# Patient Record
Sex: Female | Born: 1950
Health system: Southern US, Community
[De-identification: ages and names within clinical notes are randomized; demographics above are authoritative.]

## PROBLEM LIST (undated history)

## (undated) DIAGNOSIS — I10 Essential (primary) hypertension: Secondary | ICD-10-CM

## (undated) DIAGNOSIS — M1990 Unspecified osteoarthritis, unspecified site: Secondary | ICD-10-CM

## (undated) DIAGNOSIS — R7303 Prediabetes: Secondary | ICD-10-CM

## (undated) DIAGNOSIS — J189 Pneumonia, unspecified organism: Secondary | ICD-10-CM

## (undated) DIAGNOSIS — E039 Hypothyroidism, unspecified: Secondary | ICD-10-CM

## (undated) HISTORY — PX: TONSILLECTOMY: SUR1361

## (undated) HISTORY — PX: MENISCUS REPAIR: SHX5179

## (undated) HISTORY — PX: MULTIPLE TOOTH EXTRACTIONS: SHX2053

## (undated) HISTORY — PX: OTHER SURGICAL HISTORY: SHX169

## (undated) HISTORY — PX: JOINT REPLACEMENT: SHX530

---

## 2013-05-16 ENCOUNTER — Other Ambulatory Visit: Payer: Self-pay | Admitting: Family Medicine

## 2013-05-16 ENCOUNTER — Other Ambulatory Visit (HOSPITAL_COMMUNITY)
Admission: RE | Admit: 2013-05-16 | Discharge: 2013-05-16 | Disposition: A | Discharge status: Home / Self Care | Payer: 59 | Source: Ambulatory Visit | Attending: Family Medicine | Admitting: Family Medicine

## 2013-05-16 DIAGNOSIS — Z124 Encounter for screening for malignant neoplasm of cervix: Secondary | ICD-10-CM | POA: Insufficient documentation

## 2013-05-16 DIAGNOSIS — Z1151 Encounter for screening for human papillomavirus (HPV): Secondary | ICD-10-CM | POA: Insufficient documentation

## 2013-05-16 DIAGNOSIS — M8580 Other specified disorders of bone density and structure, unspecified site: Secondary | ICD-10-CM

## 2013-05-16 DIAGNOSIS — Z1231 Encounter for screening mammogram for malignant neoplasm of breast: Secondary | ICD-10-CM

## 2013-06-01 ENCOUNTER — Ambulatory Visit
Admission: RE | Admit: 2013-06-01 | Discharge: 2013-06-01 | Disposition: A | Discharge status: Home / Self Care | Payer: 59 | Source: Ambulatory Visit | Attending: Family Medicine | Admitting: Family Medicine

## 2013-06-01 DIAGNOSIS — M8580 Other specified disorders of bone density and structure, unspecified site: Secondary | ICD-10-CM

## 2013-06-01 DIAGNOSIS — Z1231 Encounter for screening mammogram for malignant neoplasm of breast: Secondary | ICD-10-CM

## 2014-08-03 ENCOUNTER — Other Ambulatory Visit: Payer: Self-pay

## 2014-08-03 DIAGNOSIS — Z1231 Encounter for screening mammogram for malignant neoplasm of breast: Secondary | ICD-10-CM

## 2014-08-13 ENCOUNTER — Ambulatory Visit
Admission: RE | Admit: 2014-08-13 | Discharge: 2014-08-13 | Disposition: A | Discharge status: Home / Self Care | Payer: 59 | Source: Ambulatory Visit

## 2014-08-13 ENCOUNTER — Encounter (INDEPENDENT_AMBULATORY_CARE_PROVIDER_SITE_OTHER): Payer: Self-pay

## 2014-08-13 DIAGNOSIS — Z1231 Encounter for screening mammogram for malignant neoplasm of breast: Secondary | ICD-10-CM

## 2015-09-30 ENCOUNTER — Other Ambulatory Visit: Payer: Self-pay

## 2015-09-30 DIAGNOSIS — Z1231 Encounter for screening mammogram for malignant neoplasm of breast: Secondary | ICD-10-CM

## 2015-11-08 ENCOUNTER — Ambulatory Visit: Payer: Self-pay

## 2015-11-27 ENCOUNTER — Ambulatory Visit: Payer: Self-pay

## 2015-11-27 ENCOUNTER — Ambulatory Visit
Admission: RE | Admit: 2015-11-27 | Discharge: 2015-11-27 | Disposition: A | Discharge status: Home / Self Care | Payer: BLUE CROSS/BLUE SHIELD | Source: Ambulatory Visit

## 2015-11-27 DIAGNOSIS — Z1231 Encounter for screening mammogram for malignant neoplasm of breast: Secondary | ICD-10-CM

## 2016-03-13 ENCOUNTER — Other Ambulatory Visit: Payer: Self-pay | Admitting: Physician Assistant

## 2016-03-13 DIAGNOSIS — R1032 Left lower quadrant pain: Secondary | ICD-10-CM

## 2016-03-17 ENCOUNTER — Ambulatory Visit
Admission: RE | Admit: 2016-03-17 | Discharge: 2016-03-17 | Disposition: A | Discharge status: Home / Self Care | Payer: BLUE CROSS/BLUE SHIELD | Source: Ambulatory Visit | Attending: Physician Assistant | Admitting: Physician Assistant

## 2016-03-17 ENCOUNTER — Other Ambulatory Visit: Payer: Self-pay | Admitting: Physician Assistant

## 2016-03-17 DIAGNOSIS — M25552 Pain in left hip: Secondary | ICD-10-CM

## 2016-03-20 ENCOUNTER — Other Ambulatory Visit: Payer: BLUE CROSS/BLUE SHIELD

## 2016-06-08 ENCOUNTER — Other Ambulatory Visit: Payer: Self-pay | Admitting: Physician Assistant

## 2016-06-08 ENCOUNTER — Other Ambulatory Visit (HOSPITAL_COMMUNITY)
Admission: RE | Admit: 2016-06-08 | Discharge: 2016-06-08 | Disposition: A | Discharge status: Home / Self Care | Payer: BLUE CROSS/BLUE SHIELD | Source: Ambulatory Visit | Attending: Physician Assistant | Admitting: Physician Assistant

## 2016-06-08 DIAGNOSIS — M8580 Other specified disorders of bone density and structure, unspecified site: Secondary | ICD-10-CM

## 2016-06-08 DIAGNOSIS — Z124 Encounter for screening for malignant neoplasm of cervix: Secondary | ICD-10-CM | POA: Diagnosis present

## 2016-06-08 DIAGNOSIS — Z1151 Encounter for screening for human papillomavirus (HPV): Secondary | ICD-10-CM | POA: Insufficient documentation

## 2016-06-09 LAB — CYTOLOGY - PAP

## 2016-06-24 ENCOUNTER — Ambulatory Visit
Admission: RE | Admit: 2016-06-24 | Discharge: 2016-06-24 | Disposition: A | Discharge status: Home / Self Care | Payer: BLUE CROSS/BLUE SHIELD | Source: Ambulatory Visit | Attending: Physician Assistant | Admitting: Physician Assistant

## 2016-06-24 DIAGNOSIS — M8580 Other specified disorders of bone density and structure, unspecified site: Secondary | ICD-10-CM

## 2016-12-02 ENCOUNTER — Other Ambulatory Visit: Payer: Self-pay | Admitting: Physician Assistant

## 2016-12-02 DIAGNOSIS — Z1231 Encounter for screening mammogram for malignant neoplasm of breast: Secondary | ICD-10-CM

## 2016-12-25 ENCOUNTER — Ambulatory Visit
Admission: RE | Admit: 2016-12-25 | Discharge: 2016-12-25 | Disposition: A | Discharge status: Home / Self Care | Payer: BLUE CROSS/BLUE SHIELD | Source: Ambulatory Visit | Attending: Physician Assistant | Admitting: Physician Assistant

## 2016-12-25 DIAGNOSIS — Z1231 Encounter for screening mammogram for malignant neoplasm of breast: Secondary | ICD-10-CM

## 2017-03-30 ENCOUNTER — Ambulatory Visit (INDEPENDENT_AMBULATORY_CARE_PROVIDER_SITE_OTHER): Payer: Self-pay

## 2017-03-30 ENCOUNTER — Ambulatory Visit (INDEPENDENT_AMBULATORY_CARE_PROVIDER_SITE_OTHER): Payer: BLUE CROSS/BLUE SHIELD

## 2017-03-30 ENCOUNTER — Ambulatory Visit (INDEPENDENT_AMBULATORY_CARE_PROVIDER_SITE_OTHER): Payer: BLUE CROSS/BLUE SHIELD | Admitting: Physician Assistant

## 2017-03-30 DIAGNOSIS — M25571 Pain in right ankle and joints of right foot: Secondary | ICD-10-CM | POA: Diagnosis not present

## 2017-03-30 DIAGNOSIS — M1711 Unilateral primary osteoarthritis, right knee: Secondary | ICD-10-CM

## 2017-03-30 DIAGNOSIS — M1712 Unilateral primary osteoarthritis, left knee: Secondary | ICD-10-CM

## 2017-03-30 MED ORDER — LIDOCAINE HCL 1 % IJ SOLN
3.0000 mL | INTRAMUSCULAR | Status: AC | PRN
  Administered 2017-03-30: 3 mL

## 2017-03-30 MED ORDER — METHYLPREDNISOLONE ACETATE 40 MG/ML IJ SUSP
40.0000 mg | INTRAMUSCULAR | Status: AC | PRN
  Administered 2017-03-30: 40 mg via INTRA_ARTICULAR

## 2017-03-30 NOTE — Progress Notes (Signed)
Office Visit Note   Patient: Melissa [name]           Date of Birth: 1951-10-18           MRN: [id number] Visit Date: 03/30/2017              Requested by: Michel Harrow, PA-C [place], [place] [address] PCP: Michel Harrow, PA-C   Assessment & Plan: Visit Diagnoses:  1. Primary osteoarthritis of left knee   2. Primary osteoarthritis of right knee   3. Pain in right ankle and joints of right foot     Plan: We will obtain a CT scan of her right foot to evaluate fifth metatarsal fracture for nonunion versus acute fracture. Follow up after the CT scan to go over results and discuss further treatment. Regards to her knees I discussed with her quad strengthening and over-the-counter NSAIDs. Also discussed with her tumeric. She did like to continue with conservative treatment for her knees which will include occasional cortisone injections in the knees.  Follow-Up Instructions: Return in about 2 weeks (around 04/13/2017) for After CT scan.   Orders:  Orders Placed This Encounter  Procedures  . Large Joint Injection/Arthrocentesis  . Large Joint Injection/Arthrocentesis  . XR Knee 1-2 Views Left  . XR Knee 1-2 Views Right  . XR Foot Complete Right   No orders of the defined types were placed in this encounter.     Procedures: Large Joint Inj Date/Time: 03/30/2017 10:47 AM Performed by: Pete Pelt Authorized by: Pete Pelt   Consent Given by:  Patient Indications:  Pain Location:  Knee Site:  L knee Needle Size:  25 G Approach:  Anterolateral Ultrasound Guidance: No   Fluoroscopic Guidance: No   Medications:  40 mg methylPREDNISolone acetate 40 MG/ML; 3 mL lidocaine 1 % Aspiration Attempted: No   Patient tolerance:  Patient tolerated the procedure well with no immediate complications Large Joint Inj Date/Time: 03/30/2017 10:48 AM Performed by: Pete Pelt Authorized by: Pete Pelt   Consent Given by:  Patient Indications:   Pain Location:  Knee Site:  R knee Needle Size:  25 G Approach:  Anterolateral Ultrasound Guidance: No   Fluoroscopic Guidance: No   Medications:  40 mg methylPREDNISolone acetate 40 MG/ML; 3 mL lidocaine 1 % Aspiration Attempted: No   Patient tolerance:  Patient tolerated the procedure well with no immediate complications     Clinical Data: No additional findings.   Subjective: Bilateral knee pain Right foot pain  HPI Melissa [name] 66 year old female with bilateral knee pain she states 3 years. She didn't go into the orthopedist here in town and states that she's had cortisone injections that she doesn't want any more steroid injections. She is prediabetic. She also reports that she's had supplemental injections in the left knee in the right knee was not approved for supplemental injection in that this gave her no relief. She does not want any surgery on either knee. She is seeking a second opinion on what else she can do to deal with the pain. She has lost weight and is working with a Physiological scientist to increase her quad strengths. Right foot history of fracture some 10 years ago. She points to the lateral aspect of the fifth metatarsal. She still notes that she's been told she has an arthritic flare her foot and was seen by a podiatrist was given a cortisone injection 6 weeks ago. She feels the fact that the  cortisone injection made her pain worse. She's having swelling at the end of the day and pain in the foot. She's had no new injury to the foot that she is aware of.  Review of Systems Denies chest pain shortness breath fevers chills. Otherwise please see history of present illness.  Objective: Vital Signs: There were no vitals taken for this visit.  Physical Exam  Constitutional: She is oriented to person, place, and time. She appears well-developed and well-nourished. No distress.  Pulmonary/Chest: Effort normal.  Neurological: She is alert and oriented to person, place,  and time.  Psychiatric: She has a normal mood and affect. Her behavior is normal.    Ortho Exam Bilateral knees she has good range of motion both knees. Crepitus with passive range of motion of both knees. Tenderness along the medial joint line of the old left knee. No instability valgus varus stressing of either knee no effusion abnormal warmth or erythema of either knee. Right foot she's tender over the proximal portion of the fifth metatarsal shaft. She has 5 out of 5 strength with inversion eversion against resistance in eversion however causes some pain fifth metatarsal area. Remainder the foot is nontender. No rashes skin lesions ulcerations erythema ecchymosis or edema. Specialty Comments:  No specialty comments available.  Imaging: No results found.   PMFS History: There are no active problems to display for this patient.  No past medical history on file.  No family history on file.  No past surgical history on file. Social History   Occupational History  . Not on file.   Social History Main Topics  . Smoking status: Not on file  . Smokeless tobacco: Not on file  . Alcohol use Not on file  . Drug use: Unknown  . Sexual activity: Not on file

## 2017-04-01 ENCOUNTER — Other Ambulatory Visit (INDEPENDENT_AMBULATORY_CARE_PROVIDER_SITE_OTHER): Payer: Self-pay

## 2017-04-01 DIAGNOSIS — M25571 Pain in right ankle and joints of right foot: Secondary | ICD-10-CM

## 2017-04-05 ENCOUNTER — Ambulatory Visit
Admission: RE | Admit: 2017-04-05 | Discharge: 2017-04-05 | Disposition: A | Discharge status: Home / Self Care | Payer: BLUE CROSS/BLUE SHIELD | Source: Ambulatory Visit | Attending: Physician Assistant | Admitting: Physician Assistant

## 2017-04-05 DIAGNOSIS — M25571 Pain in right ankle and joints of right foot: Secondary | ICD-10-CM

## 2017-04-14 ENCOUNTER — Ambulatory Visit (INDEPENDENT_AMBULATORY_CARE_PROVIDER_SITE_OTHER): Payer: BLUE CROSS/BLUE SHIELD | Admitting: Orthopaedic Surgery

## 2017-04-14 DIAGNOSIS — S92351K Displaced fracture of fifth metatarsal bone, right foot, subsequent encounter for fracture with nonunion: Secondary | ICD-10-CM

## 2017-04-14 DIAGNOSIS — M79671 Pain in right foot: Secondary | ICD-10-CM

## 2017-04-14 NOTE — Progress Notes (Signed)
She return for follow-up to discuss a CT scan of her right foot. We had diagnosed a fifth metatarsal fracture which appears to be potentially a nonunion. She was that x-rays in the past that she is not seen her self apparently did not show any fractures. She saw a podiatrist who injected the area of her foot and she is in the next day was severely painful. She said she can go to that office next week get her previous x-rays for my review. In our office with x-rays that were concerning for a fracture nonunion of the fifth metatarsal right foot. She walks with regular shoes and she has mild to moderate pain. He provided on examination to deep palpation she does have pain along the fifth metatarsal right foot. The CT scan does show what appears to be a nonunion of a fifth metatarsal fracture which is at the metaphyseal diaphyseal area more consistent with a Jones fracture.  At this point I would like see her back in 4 weeks ago she feels that it be worthwhile to get the x-rays were podiatrist which I agree with. I like to get a repeat 3 views of her right foot with plain x-rays in 3 weeks as well. She'll continue to avoid high-impact aerobic activities as well.

## 2017-05-12 ENCOUNTER — Ambulatory Visit (INDEPENDENT_AMBULATORY_CARE_PROVIDER_SITE_OTHER): Payer: BLUE CROSS/BLUE SHIELD

## 2017-05-12 ENCOUNTER — Ambulatory Visit (INDEPENDENT_AMBULATORY_CARE_PROVIDER_SITE_OTHER): Payer: BLUE CROSS/BLUE SHIELD | Admitting: Physician Assistant

## 2017-05-12 DIAGNOSIS — M79671 Pain in right foot: Secondary | ICD-10-CM | POA: Diagnosis not present

## 2017-05-12 DIAGNOSIS — M170 Bilateral primary osteoarthritis of knee: Secondary | ICD-10-CM

## 2017-05-12 DIAGNOSIS — S92351K Displaced fracture of fifth metatarsal bone, right foot, subsequent encounter for fracture with nonunion: Secondary | ICD-10-CM

## 2017-05-12 NOTE — Progress Notes (Signed)
Office Visit Note   Patient: Melissa [name]           Date of Birth: [birth year]           MRN: [id number] Visit Date: 05/12/2017              Requested by: Michel Harrow, PA-C [place], [place] [address] PCP: Michel Harrow, PA-C   Assessment & Plan: Visit Diagnoses:  1. Pain in right foot   2. 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 Primary osteoarthritis of both knees     Plan: Place her in a short Cam Walker boot on the right. She is not to drive in it. She does not need to sleep in the boot. We will try to obtain a bone stimulator for her right foot fifth metatarsal fracture. We'll obtain AP lateral and oblique views of the right foot at return Regards to her knee she understands that we cannot inject the knees again until August. We'll see her back in that time inject both knees with the cortisone.  Follow-Up Instructions: Return in about 7 weeks (around 06/30/2017).   Orders:  Orders Placed This Encounter  Procedures  . XR Foot Complete Right   No orders of the defined types were placed in this encounter.     Procedures: No procedures performed   Clinical Data: No additional findings.   Subjective: Chief Complaint  Patient presents with  . Right Foot - Follow-up    Nonunion 5th MT fx  Bilateral knee pain  HPI  Melissa [name] Is a 66 year old female who returns today follow-up for right foot fifth metatarsal fracture she brings with her films on a disc from her podiatrist which show 3 views of the right foot on [age] teen. These showed no acute fracture. She states she went home later that day and twisted the foot and had pain in the foot return to the podiatrist the next day as for radiographs and he stated that the pain in her foot was due to the cortisone injection given her the day before. Since then she is found to have a fifth metatarsal fracture on plain radiograph and underwent a CT a CT scan of the  right foot that was ordered by her office. This showed what appeared to be a nonunion fracture of the fifth metatarsal. She states she has some pain in the foot and swelling by the end of the day that her knees hurt just as bad.  Review of Systems See HPI otherwise negative  Objective: Vital Signs: There were no vitals taken for this visit.  Physical Exam  Constitutional: She is oriented to person, place, and time. She appears well-developed and well-nourished. No distress.  Pulmonary/Chest: Effort normal.  Neurological: She is alert and oriented to person, place, and time.  Psychiatric: She has a normal mood and affect. Her behavior is normal.    Ortho Exam Right foot tenderness over the base of the fifth metatarsal. No rashes skin lesions ulcerations erythema or impending ulcers or edema. She has good inversion eversion foot.   Specialty Comments:  No specialty comments available.  Imaging: Xr Foot Complete Right  Result Date: 05/12/2017 Right foot: 5th metatarsal : Fifth metatarsal fracture remains basically unchanged do not see any significant callus formation since previous films on 03/30/2017.    PMFS History: Patient Active Problem List   Diagnosis Date Noted  . Closed fracture of base of fifth  metatarsal bone with nonunion, right 04/14/2017   No past medical history on file.  No family history on file.  No past surgical history on file. Social History   Occupational History  . Not on file.   Social History Main Topics  . Smoking status: Not on file  . Smokeless tobacco: Not on file  . Alcohol use Not on file  . Drug use: Unknown  . Sexual activity: Not on file

## 2017-07-05 ENCOUNTER — Ambulatory Visit (INDEPENDENT_AMBULATORY_CARE_PROVIDER_SITE_OTHER): Payer: Self-pay

## 2017-07-05 ENCOUNTER — Ambulatory Visit (INDEPENDENT_AMBULATORY_CARE_PROVIDER_SITE_OTHER): Payer: BLUE CROSS/BLUE SHIELD | Admitting: Orthopaedic Surgery

## 2017-07-05 ENCOUNTER — Encounter (INDEPENDENT_AMBULATORY_CARE_PROVIDER_SITE_OTHER): Payer: Self-pay | Admitting: Orthopaedic Surgery

## 2017-07-05 DIAGNOSIS — G8929 Other chronic pain: Secondary | ICD-10-CM | POA: Diagnosis not present

## 2017-07-05 DIAGNOSIS — M1711 Unilateral primary osteoarthritis, right knee: Secondary | ICD-10-CM | POA: Diagnosis not present

## 2017-07-05 DIAGNOSIS — M79671 Pain in right foot: Secondary | ICD-10-CM | POA: Diagnosis not present

## 2017-07-05 DIAGNOSIS — M25562 Pain in left knee: Secondary | ICD-10-CM

## 2017-07-05 DIAGNOSIS — M25561 Pain in right knee: Secondary | ICD-10-CM

## 2017-07-05 DIAGNOSIS — M1712 Unilateral primary osteoarthritis, left knee: Secondary | ICD-10-CM

## 2017-07-05 MED ORDER — LIDOCAINE HCL 1 % IJ SOLN
3.0000 mL | INTRAMUSCULAR | Status: AC | PRN
  Administered 2017-07-05: 3 mL

## 2017-07-05 MED ORDER — METHYLPREDNISOLONE ACETATE 40 MG/ML IJ SUSP
40.0000 mg | INTRAMUSCULAR | Status: AC | PRN
  Administered 2017-07-05: 40 mg via INTRA_ARTICULAR

## 2017-07-05 NOTE — Progress Notes (Signed)
Office Visit Note   Patient: Melissa [name]           Date of Birth: [birth year]           MRN: [id number] Visit Date: 07/05/2017              Requested by: Michel Harrow, PA-C [place], [place] [address] PCP: Michel Harrow, PA-C   Assessment & Plan: Visit Diagnoses:  1. Pain in right foot   2. Chronic pain of right knee   3. Chronic pain of left knee   4. Unilateral primary osteoarthritis, left knee   5. Unilateral primary osteoarthritis, right knee     Plan: Given the fact she is having no pain over her fifth metatarsal area on exam is only in her ankle with a very stiff ankle joint she rather not have any type of surgery right now. She is a low physical demands type of person. We'll see her back in 3 months with a repeat 3 views of her right foot as well as considering steroid shots in her knees again. She was counseled about things extensively and all questions were encouraged and answered.  Follow-Up Instructions: Return in about 3 months (around 10/05/2017).   Orders:  Orders Placed This Encounter  Procedures  . Large Joint Injection/Arthrocentesis  . Large Joint Injection/Arthrocentesis  . XR Foot Complete Right   No orders of the defined types were placed in this encounter.     Procedures: Large Joint Inj Date/Time: 07/05/2017 8:53 AM Performed by: Mcarthur Rossetti Authorized by: Jean Rosenthal Y   Location:  Knee Site:  R knee Ultrasound Guidance: No   Fluoroscopic Guidance: No   Arthrogram: No   Medications:  3 mL lidocaine 1 %; 40 mg methylPREDNISolone acetate 40 MG/ML Large Joint Inj Date/Time: 07/05/2017 8:53 AM Performed by: Mcarthur Rossetti Authorized by: Mcarthur Rossetti   Location:  Knee Site:  L knee Ultrasound Guidance: No   Fluoroscopic Guidance: No   Arthrogram: No   Medications:  3 mL lidocaine 1 %; 40 mg methylPREDNISolone acetate 40 MG/ML     Clinical Data: No additional  findings.   Subjective: Chief Complaint  Patient presents with  . Right Foot - Pain  . Left Knee - Pain  . 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 Were seeing her today for follow-up with x-rays of the foot. She also has known osteoarthritis in both knees and would like a steroid injection in both knees today. She still reports some chronic pain in both knees and her right foot. She's had no acute changes in her medical status. The pain is affecting detrimentally her activity is daily living, her quality of life, and her mobility. She denies any significant right foot pain but does say she has right ankle stiffness.  HPI  Review of Systems She denies any chest pain, shortness of breath, fever, chills, nausea, vomiting, headache  Objective: Vital Signs: There were no vitals taken for this visit.  Physical Exam She is alert and oriented 3 in no acute distress Ortho Exam Examination of her right foot shows some mild pain over the base of the fifth metatarsal otherwise a normal foot and ankle exam except for a tight heel cord. This limits her foot dorsiflexion. Examination of both knees show no significant effusion with mild patellofemoral crepitation with good range  of motion and medial joint line tenderness of both knees. Specialty Comments:  No specialty comments available.  Imaging: Xr Foot Complete Right  Result Date: 07/05/2017 3 views of the right foot show a chronic nonunion of the fifth metatarsal fracture at the metaphyseal diaphyseal junction. There is no evidence of healing.    PMFS History: Patient Active Problem List   Diagnosis Date Noted  . Pain in right foot 07/05/2017  . Chronic pain of right knee 07/05/2017  . Chronic pain of left knee 07/05/2017  . Unilateral primary osteoarthritis, left knee 07/05/2017  . Unilateral primary osteoarthritis, right knee 07/05/2017   . 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  No family history on file.  No past surgical history on file. Social History   Occupational History  . Not on file.   Social History Main Topics  . Smoking status: Never Smoker  . Smokeless tobacco: Never Used  . Alcohol use Not on file  . Drug use: Unknown  . Sexual activity: Not on file

## 2017-10-05 ENCOUNTER — Ambulatory Visit (INDEPENDENT_AMBULATORY_CARE_PROVIDER_SITE_OTHER): Payer: BLUE CROSS/BLUE SHIELD | Admitting: Physician Assistant

## 2017-10-05 ENCOUNTER — Encounter (INDEPENDENT_AMBULATORY_CARE_PROVIDER_SITE_OTHER): Payer: Self-pay | Admitting: Physician Assistant

## 2017-10-05 DIAGNOSIS — M170 Bilateral primary osteoarthritis of knee: Secondary | ICD-10-CM | POA: Diagnosis not present

## 2017-10-05 DIAGNOSIS — M79671 Pain in right foot: Secondary | ICD-10-CM | POA: Diagnosis not present

## 2017-10-05 MED ORDER — METHYLPREDNISOLONE ACETATE 40 MG/ML IJ SUSP
40.0000 mg | INTRAMUSCULAR | Status: AC | PRN
  Administered 2017-10-05: 40 mg via INTRA_ARTICULAR

## 2017-10-05 MED ORDER — LIDOCAINE HCL 1 % IJ SOLN
0.5000 mL | INTRAMUSCULAR | Status: AC | PRN
  Administered 2017-10-05: .5 mL

## 2017-10-05 NOTE — Progress Notes (Signed)
Office Visit Note   Patient: Melissa [name]           Date of Birth: September 25, 1951           MRN: [id number] Visit Date: 10/05/2017              Requested by: Michel Harrow, PA-C [place], [place] [address] PCP: Michel Harrow, PA-C   Assessment & Plan: Visit Diagnoses:  1. Right foot pain     Plan:   Follow-Up Instructions: Return if symptoms worsen or fail to improve.   Orders:  Orders Placed This Encounter  Procedures  . XR Foot Complete Right   No orders of the defined types were placed in this encounter.     Procedures: Large Joint Inj: bilateral knee on 10/05/2017 12:23 PM Indications: pain Details: 22 G 1.5 in needle, anterolateral approach  Arthrogram: No  Medications (Right): 0.5 mL lidocaine 1 %; 40 mg methylPREDNISolone acetate 40 MG/ML Medications (Left): 0.5 mL lidocaine 1 %; 40 mg methylPREDNISolone acetate 40 MG/ML Outcome: tolerated well, no immediate complications Procedure, treatment alternatives, risks and benefits explained, specific risks discussed. Consent was given by the patient. Immediately prior to procedure a time out was called to verify the correct patient, procedure, equipment, support staff and site/side marked as required. Patient was prepped and draped in the usual sterile fashion.       Clinical Data: No additional findings.   Subjective: Chief Complaint  Patient presents with  . Right Foot - Follow-up    HPI Melissa [name] is here today for follow-up of her right foot fifth metatarsal fracture.  She states she is no longer have any pain in the foot.  Her main complaint today is right greater than left knee pain.  She notes decreased strength in the right knee.  No giving way catching locking painful popping of either knee.  She is requesting cortisone injections in both knees.  She has known arthritis of both knees. Review of Systems Denies fevers chills shortness of breath or chest  pain  Objective: Vital Signs: There were no vitals taken for this visit.  Physical Exam  Constitutional: She is oriented to person, place, and time. She appears well-developed and well-nourished. No distress.  Pulmonary/Chest: Effort normal.  Neurological: She is alert and oriented to person, place, and time.  Skin: She is not diaphoretic.  Psychiatric: She has a normal mood and affect.    Ortho Exam Right foot she has no edema ecchymosis or erythema about the foot.  No impending ulcers of the right foot.  She is nontender over the right fifth metatarsal proximally.  She is able to evert her foot. Bilateral knees good range of motion of both knees.  No instability valgus varus stressing.  She has tenderness along medial lateral joint line of both knees.  Positive effusion of the right knee.  No abnormal warmth erythema or ecchymosis of either knee. Specialty Comments:  No specialty comments available.  Imaging: Xr Foot Complete Right  Result Date: 10/05/2017 Right foot: Fifth metatarsal fracture remains apparent.  This consistent with a nonunion.  No other bony abnormalities or fractures or bony lesions seen.    PMFS History: Patient Active Problem List   Diagnosis Date Noted  . Pain in right foot 07/05/2017  . Chronic pain of right knee 07/05/2017  . Chronic pain of left knee 07/05/2017  . Unilateral primary osteoarthritis, left knee 07/05/2017  . Unilateral primary osteoarthritis, right knee 07/05/2017  .  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  No family history on file.  No past surgical history on file. Social History   Occupational History  . Not on file  Tobacco Use  . Smoking status: Never Smoker  . Smokeless tobacco: Never Used  Substance and Sexual Activity  . Alcohol use: Not on file  . Drug use: Not on file  . Sexual activity: Not on file

## 2017-12-06 ENCOUNTER — Ambulatory Visit (INDEPENDENT_AMBULATORY_CARE_PROVIDER_SITE_OTHER): Payer: BLUE CROSS/BLUE SHIELD | Admitting: Orthopaedic Surgery

## 2017-12-06 ENCOUNTER — Encounter (INDEPENDENT_AMBULATORY_CARE_PROVIDER_SITE_OTHER): Payer: Self-pay | Admitting: Orthopaedic Surgery

## 2017-12-06 VITALS — Ht 64.0 in | Wt 180.0 lb

## 2017-12-06 DIAGNOSIS — M1711 Unilateral primary osteoarthritis, right knee: Secondary | ICD-10-CM

## 2017-12-06 NOTE — Progress Notes (Signed)
The patient is well-known to [place].  She has severe debilitating arthritis of her right knee with valgus malalignment and this is well documented.  She has had injections of her long period time but is gotten to where the injections are lasting are helping her at all.  She was on vacation this past month of December and the pain is now become daily and is 10 out of 10.  It is detrimentally affecting activity living, quality of life, mobility.  At this point she does wish to proceed with total knee arthroplasty but in spring or May of this year.  She is a diabetic but is been under good control for many years now with she says normal hemoglobin A1c's.  She only takes metformin only for diabetes.  On examination of her right knee she does have valgus malalignment that knee with a mild effusion and severe global tenderness about the medial lateral joint lines and patellofemoral crepitation.  We did go over her x-rays again with her showing her the extent of the severe end-stage arthritis of her right knee with valgus malalignment and complete loss of the lateral joint line.  There are periarticular osteophytes throughout the knee.  This point I showed her knee model and went over the details with the surgery involves including the risk and benefits of knee replacement surgery.  We a long and thorough discussion about her intraoperative and postoperative course.  She would like to come for injections next month for her knees and I believe this would be fine since she is tolerated in the past and that will be well before surgery in May.

## 2018-01-05 ENCOUNTER — Ambulatory Visit (INDEPENDENT_AMBULATORY_CARE_PROVIDER_SITE_OTHER): Payer: BLUE CROSS/BLUE SHIELD | Admitting: Orthopaedic Surgery

## 2018-01-05 ENCOUNTER — Encounter (INDEPENDENT_AMBULATORY_CARE_PROVIDER_SITE_OTHER): Payer: Self-pay | Admitting: Orthopaedic Surgery

## 2018-01-05 ENCOUNTER — Telehealth (INDEPENDENT_AMBULATORY_CARE_PROVIDER_SITE_OTHER): Payer: Self-pay | Admitting: Orthopaedic Surgery

## 2018-01-05 DIAGNOSIS — G8929 Other chronic pain: Secondary | ICD-10-CM | POA: Diagnosis not present

## 2018-01-05 DIAGNOSIS — M1712 Unilateral primary osteoarthritis, left knee: Secondary | ICD-10-CM | POA: Diagnosis not present

## 2018-01-05 DIAGNOSIS — M25562 Pain in left knee: Secondary | ICD-10-CM | POA: Diagnosis not present

## 2018-01-05 DIAGNOSIS — M25561 Pain in right knee: Secondary | ICD-10-CM

## 2018-01-05 DIAGNOSIS — M1711 Unilateral primary osteoarthritis, right knee: Secondary | ICD-10-CM | POA: Diagnosis not present

## 2018-01-05 NOTE — Telephone Encounter (Signed)
Patient would like to schedule her surgery with Dr. Ninfa Linden as soon as possible. CB # [phone]

## 2018-01-05 NOTE — Progress Notes (Signed)
Office Visit Note   Patient: Melissa [name]           Date of Birth: November 26, 1950           MRN: [id number] Visit Date: 01/05/2018              Requested by: Michel Harrow, PA-C [place],  [id number] PCP: [facility]   Assessment & Plan: Visit Diagnoses:  1. Unilateral primary osteoarthritis, right knee   2. Unilateral primary osteoarthritis, left knee   3. Chronic pain of right knee   4. Chronic pain of left knee     Plan: She did wish to have injections in both her knees today with steroid.  I will address the risk and benefits of steroid injection in her knees.  I was finally doing this today since we are not scheduling surgery until later in the spring.  All questions concerns were answered and addressed.  With showed her a knee model and talked about the risk and benefits of surgery and a long and thorough discussion about her intraoperative and postoperative course and what to expect.  We will work on getting this scheduled.  We then see her back in 2 weeks postoperative.  Follow-Up Instructions: Return for 2 weeks post-op.   Orders:  No orders of the defined types were placed in this encounter.  No orders of the defined types were placed in this encounter.     Procedures: Large Joint Inj: bilateral knee on 01/05/2018 9:16 AM Indications: diagnostic evaluation and pain Details: 22 G 1.5 in needle, superolateral approach  Arthrogram: No  Outcome: tolerated well, no immediate complications Procedure, treatment alternatives, risks and benefits explained, specific risks discussed. Consent was given by the patient. Immediately prior to procedure a time out was called to verify the correct patient, procedure, equipment, support staff and site/side marked as required. Patient was prepped and draped in the usual sterile fashion.       Clinical Data: No additional findings.   Subjective: Chief Complaint  Patient presents  with  . Right Knee - Follow-up  The patient is someone well-known to our office.  She has severe debilitating arthritis of both her knees with the right being worse than the left.  This is been well documented with x-rays.  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  She has had multiple injections in her knees.  Her x-rays of her right knee show severe valgus malalignment with complete loss of lateral joint space in particular osteophytes throughout the knee.  She says she is at the point where she is failed all forms of conservative treatment and like to be set up for knee replacement surgery of the right knee sometime in late April or early May.  Again she is tried and failed all forms of conservative treatment for well over a year now.  Her pain is 10 out of 10 and is detrimentally affecting her quality of life as well as her mobility and activities of daily living.  HPI  Review of Systems She currently denies any headache, chest pain, shortness of breath, fever, chills, nausea, vomiting.  Objective: Vital Signs: There were no vitals taken for this visit.  Physical Exam She is alert and oriented x3 and in no acute distress Ortho Exam Examination of both knees show good range of motion bilaterally.  She has severe lateral joint line tenderness with significant patellofemoral palpation on the right knee.  She has significant patellofemoral crepitation on the left knee with medial joint line tenderness. Specialty Comments:  No specialty comments available.  Imaging: No results found. X-rays reviewed again of both knees show severe tricompartmental arthritis of both knees with the worst being the right knee with a severe valgus malalignment of the right knee.  PMFS History: Patient Active Problem List   Diagnosis Date Noted  . Pain in right foot 07/05/2017  . Chronic pain of right knee 07/05/2017  . Chronic pain of left knee 07/05/2017  . Unilateral  primary osteoarthritis, left knee 07/05/2017  . Unilateral primary osteoarthritis, right knee 07/05/2017  . 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  No family history on file.  No past surgical history on file. Social History   Occupational History  . Not on file  Tobacco Use  . Smoking status: Never Smoker  . Smokeless tobacco: Never Used  Substance and Sexual Activity  . Alcohol use: Not on file  . Drug use: Not on file  . Sexual activity: Not on file

## 2018-01-27 ENCOUNTER — Other Ambulatory Visit: Payer: Self-pay | Admitting: Family Medicine

## 2018-01-27 DIAGNOSIS — Z1231 Encounter for screening mammogram for malignant neoplasm of breast: Secondary | ICD-10-CM

## 2018-02-17 ENCOUNTER — Ambulatory Visit
Admission: RE | Admit: 2018-02-17 | Discharge: 2018-02-17 | Disposition: A | Discharge status: Home / Self Care | Payer: BLUE CROSS/BLUE SHIELD | Source: Ambulatory Visit | Attending: Family Medicine | Admitting: Family Medicine

## 2018-02-17 DIAGNOSIS — Z1231 Encounter for screening mammogram for malignant neoplasm of breast: Secondary | ICD-10-CM

## 2018-02-21 ENCOUNTER — Telehealth (INDEPENDENT_AMBULATORY_CARE_PROVIDER_SITE_OTHER): Payer: Self-pay | Admitting: Orthopaedic Surgery

## 2018-02-21 NOTE — Telephone Encounter (Signed)
FYI Patient requesting bilateral cortisone knee injections at her appt 5/14. She is putting off her surgery until next year due to scheduling issues.

## 2018-02-21 NOTE — Telephone Encounter (Signed)
Patient needs to cancel surgery due to not getting enough information and not being prepared. Patient will continue to receive cortisone injections and will wait for the surgery next year.

## 2018-04-05 ENCOUNTER — Ambulatory Visit (INDEPENDENT_AMBULATORY_CARE_PROVIDER_SITE_OTHER): Payer: BLUE CROSS/BLUE SHIELD | Admitting: Orthopaedic Surgery

## 2018-04-05 ENCOUNTER — Encounter (INDEPENDENT_AMBULATORY_CARE_PROVIDER_SITE_OTHER): Payer: Self-pay | Admitting: Orthopaedic Surgery

## 2018-04-05 DIAGNOSIS — M170 Bilateral primary osteoarthritis of knee: Secondary | ICD-10-CM | POA: Diagnosis not present

## 2018-04-05 MED ORDER — LIDOCAINE HCL 1 % IJ SOLN
0.5000 mL | INTRAMUSCULAR | Status: AC | PRN
  Administered 2018-04-05: .5 mL

## 2018-04-05 MED ORDER — METHYLPREDNISOLONE ACETATE 40 MG/ML IJ SUSP
40.0000 mg | INTRAMUSCULAR | Status: AC | PRN
  Administered 2018-04-05: 40 mg via INTRA_ARTICULAR

## 2018-04-05 NOTE — Progress Notes (Signed)
   Procedure Note  Patient: Melissa [name]             Date of Birth: Sep 02, 1951           MRN: [id number]             Visit Date: 04/05/2018 HPI: Melissa [name] returns today for bilateral knee injections.  She was last seen February had bilateral knee injections at that time did well for short period of time.  She is hoping to have surgery for knee replacement but she is unable to have her options for rehab and what her overall cost would be addressed. Procedures: Visit Diagnoses: Primary osteoarthritis of both knees  Large Joint Inj: bilateral knee on 04/05/2018 9:50 AM Indications: pain Details: 22 G 1.5 in needle, anterolateral approach  Arthrogram: No  Medications (Right): 0.5 mL lidocaine 1 %; 40 mg methylPREDNISolone acetate 40 MG/ML Medications (Left): 0.5 mL lidocaine 1 %; 40 mg methylPREDNISolone acetate 40 MG/ML Outcome: tolerated well, no immediate complications Procedure, treatment alternatives, risks and benefits explained, specific risks discussed. Consent was given by the patient. Immediately prior to procedure a time out was called to verify the correct patient, procedure, equipment, support staff and site/side marked as required. Patient was prepped and draped in the usual sterile fashion.     Plan: I have her surgery scheduler tried to address her questions to cost and rehab options.  Once these are addressed we will get her back on the schedule for knee replacement.  Questions were encouraged and answered at length today.

## 2018-07-06 ENCOUNTER — Encounter (INDEPENDENT_AMBULATORY_CARE_PROVIDER_SITE_OTHER): Payer: Self-pay | Admitting: Orthopaedic Surgery

## 2018-07-06 ENCOUNTER — Ambulatory Visit (INDEPENDENT_AMBULATORY_CARE_PROVIDER_SITE_OTHER): Payer: BLUE CROSS/BLUE SHIELD | Admitting: Orthopaedic Surgery

## 2018-07-06 ENCOUNTER — Ambulatory Visit (INDEPENDENT_AMBULATORY_CARE_PROVIDER_SITE_OTHER): Payer: Self-pay

## 2018-07-06 DIAGNOSIS — M25562 Pain in left knee: Secondary | ICD-10-CM | POA: Diagnosis not present

## 2018-07-06 DIAGNOSIS — M25511 Pain in right shoulder: Secondary | ICD-10-CM | POA: Diagnosis not present

## 2018-07-06 DIAGNOSIS — M1712 Unilateral primary osteoarthritis, left knee: Secondary | ICD-10-CM | POA: Diagnosis not present

## 2018-07-06 DIAGNOSIS — G8929 Other chronic pain: Secondary | ICD-10-CM | POA: Diagnosis not present

## 2018-07-06 DIAGNOSIS — M1711 Unilateral primary osteoarthritis, right knee: Secondary | ICD-10-CM

## 2018-07-06 MED ORDER — LIDOCAINE HCL 1 % IJ SOLN
3.0000 mL | INTRAMUSCULAR | Status: AC | PRN
  Administered 2018-07-06: 3 mL

## 2018-07-06 MED ORDER — METHYLPREDNISOLONE ACETATE 40 MG/ML IJ SUSP
40.0000 mg | INTRAMUSCULAR | Status: AC | PRN
  Administered 2018-07-06: 40 mg via INTRA_ARTICULAR

## 2018-07-06 NOTE — Progress Notes (Signed)
Office Visit Note   Patient: Melissa [name]           Date of Birth: 06-11-1951           MRN: [id number] Visit Date: 07/06/2018              Requested by: [facility] [address], [place] [address] PCP: [facility]   Assessment & Plan: Visit Diagnoses:  1. Right shoulder pain, unspecified chronicity   2. Chronic pain of left knee   3. Unilateral primary osteoarthritis, left knee   4. Unilateral primary osteoarthritis, right knee     Plan: I do feel that her shoulder is more of a calcific tendinitis of the rotator cuff with associated bursitis.  I told her about a steroid injection in the right shoulder and she agreed with this.  She tolerated this well.  Both knees have severe osteoarthritis in her knees of knee replacement surgery for when she is ready for this.  She continues to want to work with a Physiological scientist to get stronger and leaner.  She just wanted steroid injections in her knees today and agreed with her continuing this path.  She tolerated these injections well.  We will see her back in 3 months to see about repeat injections or sooner if needed.  Obviously if we need to see her sooner we would not place injections this is only be if surgical in her condition is indicated.  Follow-Up Instructions: Return in about 3 months (around 10/06/2018).   Orders:  Orders Placed This Encounter  Procedures  . Large Joint Inj  . Large Joint Inj  . Large Joint Inj  . XR Shoulder Right   No orders of the defined types were placed in this encounter.     Procedures: Large Joint Inj: R knee on 07/06/2018 4:04 PM Indications: diagnostic evaluation and pain Details: 22 G 1.5 in needle, superolateral approach  Arthrogram: No  Medications: 3 mL lidocaine 1 %; 40 mg methylPREDNISolone acetate 40 MG/ML Outcome: tolerated well, no immediate complications Procedure, treatment alternatives, risks and  benefits explained, specific risks discussed. Consent was given by the patient. Immediately prior to procedure a time out was called to verify the correct patient, procedure, equipment, support staff and site/side marked as required. Patient was prepped and draped in the usual sterile fashion.   Large Joint Inj: L knee on 07/06/2018 4:04 PM Indications: diagnostic evaluation and pain Details: 22 G 1.5 in needle, superolateral approach  Arthrogram: No  Medications: 3 mL lidocaine 1 %; 40 mg methylPREDNISolone acetate 40 MG/ML Outcome: tolerated well, no immediate complications Procedure, treatment alternatives, risks and benefits explained, specific risks discussed. Consent was given by the patient. Immediately prior to procedure a time out was called to verify the correct patient, procedure, equipment, support staff and site/side marked as required. Patient was prepped and draped in the usual sterile fashion.   Large Joint Inj: R subacromial bursa on 07/06/2018 4:04 PM Indications: pain and diagnostic evaluation Details: 22 G 1.5 in needle  Arthrogram: No  Medications: 3 mL lidocaine 1 %; 40 mg methylPREDNISolone acetate 40 MG/ML Outcome: tolerated well, no immediate complications Procedure, treatment alternatives, risks and benefits explained, specific risks discussed. Consent was given by the patient. Immediately prior to procedure a time out was called to verify the correct patient, procedure, equipment, support staff and site/side marked as required. Patient was prepped and draped in the usual sterile  fashion.       Clinical Data: No additional findings.   Subjective: Chief Complaint  Patient presents with  . Left Knee - Pain  . Right Knee - Pain  The patient comes in today with known severe arthritis in both knees and bilateral knee pain.  We have recommended knee replacement surgery but she still wants to hold off on this.  She is requesting bilateral knee steroid injections  today.  She has had this before in the past and has been over 3 months.  She is also been having some right shoulder pain with overhead activities and reaching behind her and is been worsening for couple months now.  She is been working with a Physiological scientist to get in shape and lose weight.  She denies any previous injuries to her right shoulder and denies any weakness but it definitely hurts with reaching overhead and behind her with the right shoulder.  It started at night and it does wake her up at night.  She reports good blood glucose control.  HPI  Review of Systems She currently denies any headache, chest pain, shortness of breath, fever, chills, nausea, vomiting.  Objective: Vital Signs: There were no vitals taken for this visit.  Physical Exam She is alert and oriented x3 and in no acute distress Ortho Exam Examination of both knees show patellofemoral crepitation bilaterally.  Her right knee has lateral joint line tenderness and her left knee has medial joint line tenderness.  Examination of her shoulder shows good range of motion overall that is full.  She has pain when stressing the rotator cuff with positive Neer and Hawkins signs as well.  She has positive crossarm pain. Specialty Comments:  No specialty comments available.  Imaging: Xr Shoulder Right  Result Date: 07/06/2018 3 views of the right shoulder show well located shoulder.  There is calcifications around the insertion of the rotator cuff suggesting calcific tendinitis.    PMFS History: Patient Active Problem List   Diagnosis Date Noted  . Pain in right foot 07/05/2017  . Chronic pain of right knee 07/05/2017  . Chronic pain of left knee 07/05/2017  . Unilateral primary osteoarthritis, left knee 07/05/2017  . Unilateral primary osteoarthritis, right knee 07/05/2017  . 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, right 04/14/2017   History reviewed. No pertinent past medical history.    Family History  Problem Relation Age of Onset  . Breast cancer Maternal Aunt        in 76's    History reviewed. No pertinent surgical history. Social History   Occupational History  . Not on file  Tobacco Use  . Smoking status: Never Smoker  . Smokeless tobacco: Never Used  Substance and Sexual Activity  . Alcohol use: Not on file  . Drug use: Not on file  . Sexual activity: Not on file

## 2018-07-11 ENCOUNTER — Ambulatory Visit (INDEPENDENT_AMBULATORY_CARE_PROVIDER_SITE_OTHER): Payer: BLUE CROSS/BLUE SHIELD | Admitting: Orthopaedic Surgery

## 2018-09-14 ENCOUNTER — Ambulatory Visit (INDEPENDENT_AMBULATORY_CARE_PROVIDER_SITE_OTHER): Payer: BLUE CROSS/BLUE SHIELD | Admitting: Orthopaedic Surgery

## 2018-09-14 ENCOUNTER — Encounter (INDEPENDENT_AMBULATORY_CARE_PROVIDER_SITE_OTHER): Payer: Self-pay | Admitting: Orthopaedic Surgery

## 2018-09-14 ENCOUNTER — Ambulatory Visit (INDEPENDENT_AMBULATORY_CARE_PROVIDER_SITE_OTHER): Payer: Self-pay

## 2018-09-14 DIAGNOSIS — M25561 Pain in right knee: Secondary | ICD-10-CM

## 2018-09-14 DIAGNOSIS — G8929 Other chronic pain: Secondary | ICD-10-CM

## 2018-09-14 DIAGNOSIS — M25562 Pain in left knee: Secondary | ICD-10-CM | POA: Diagnosis not present

## 2018-09-14 DIAGNOSIS — M5442 Lumbago with sciatica, left side: Secondary | ICD-10-CM | POA: Diagnosis not present

## 2018-09-14 MED ORDER — METHYLPREDNISOLONE ACETATE 40 MG/ML IJ SUSP
40.0000 mg | INTRAMUSCULAR | Status: AC | PRN
  Administered 2018-09-14: 40 mg via INTRA_ARTICULAR

## 2018-09-14 MED ORDER — LIDOCAINE HCL 1 % IJ SOLN
3.0000 mL | INTRAMUSCULAR | Status: AC | PRN
  Administered 2018-09-14: 3 mL

## 2018-09-14 MED ORDER — TIZANIDINE HCL 4 MG PO TABS: 4.0000 mg | ORAL_TABLET | Freq: Two times a day (BID) | ORAL | 0 refills | Status: DC | PRN

## 2018-09-14 MED ORDER — MELOXICAM 7.5 MG PO TABS: 7.5000 mg | ORAL_TABLET | Freq: Two times a day (BID) | ORAL | 1 refills | Status: DC | PRN

## 2018-09-14 MED ORDER — ACETAMINOPHEN-CODEINE #3 300-30 MG PO TABS: 1.0000 | ORAL_TABLET | Freq: Three times a day (TID) | ORAL | 0 refills | Status: DC | PRN

## 2018-09-14 NOTE — Progress Notes (Signed)
Office Visit Note   Patient: Melissa [name]           Date of Birth: Apr 20, 1951           MRN: [id number] Visit Date: 09/14/2018              Requested by: [facility] [place], [place] [address] PCP: [facility]   Assessment & Plan: Visit Diagnoses:  1. Left-sided low back pain with left-sided sciatica, unspecified chronicity   2. Chronic pain of right knee   3. Chronic pain of left knee     Plan: Per her wishes I did provide a steroid injection in both knees today.  We do need to set her up for an MRI of her lumbar spine given the significance of her sciatica going on her left side.  This will be to rule out herniated disc.  I will start her on Tylenol 3 as well as Zanaflex and meloxicam.  All question concerns were answered and addressed.  We will see her back after the MRI.  Follow-Up Instructions: Return in about 3 weeks (around 10/05/2018).   Orders:  Orders Placed This Encounter  Procedures  . Large Joint Inj  . Large Joint Inj  . XR Lumbar Spine 2-3 Views   Meds ordered this encounter  Medications  . acetaminophen-codeine (TYLENOL #3) 300-30 MG tablet    Sig: Take 1-2 tablets by mouth every 8 (eight) hours as needed for moderate pain.    Dispense:  30 tablet    Refill:  0  . tiZANidine (ZANAFLEX) 4 MG tablet    Sig: Take 1 tablet (4 mg total) by mouth 2 (two) times daily as needed for muscle spasms.    Dispense:  30 tablet    Refill:  0  . meloxicam (MOBIC) 7.5 MG tablet    Sig: Take 1 tablet (7.5 mg total) by mouth 2 (two) times daily between meals as needed for pain.    Dispense:  60 tablet    Refill:  1      Procedures: Large Joint Inj: R knee on 09/14/2018 3:15 PM Indications: diagnostic evaluation and pain Details: 22 G 1.5 in needle, superolateral approach  Arthrogram: No  Medications: 3 mL lidocaine 1 %; 40 mg methylPREDNISolone acetate 40 MG/ML Outcome: tolerated  well, no immediate complications Procedure, treatment alternatives, risks and benefits explained, specific risks discussed. Consent was given by the patient. Immediately prior to procedure a time out was called to verify the correct patient, procedure, equipment, support staff and site/side marked as required. Patient was prepped and draped in the usual sterile fashion.   Large Joint Inj: L knee on 09/14/2018 3:15 PM Indications: diagnostic evaluation and pain Details: 22 G 1.5 in needle, superolateral approach  Arthrogram: No  Medications: 3 mL lidocaine 1 %; 40 mg methylPREDNISolone acetate 40 MG/ML Outcome: tolerated well, no immediate complications Procedure, treatment alternatives, risks and benefits explained, specific risks discussed. Consent was given by the patient. Immediately prior to procedure a time out was called to verify the correct patient, procedure, equipment, support staff and site/side marked as required. Patient was prepped and draped in the usual sterile fashion.       Clinical Data: No additional findings.   Subjective: Chief Complaint  Patient presents with  . Lower Back - Pain  The patient comes in today with multiple complaints.  She is originally seeing me for her back but I  seen her for her knees before and she is having bursitis and tendinitis in her shoulder.  She understands we can see how of these things today because running behind also goes with her and set her up for that.  Her biggest complaint is low back pain with radicular symptoms going down her backside to the back of her knee and out of her foot.  She is not a diabetic is been seeing a Physiological scientist with no actual injury but she is questioning whether or not she has a pulled muscle or some type of sciatica.  She also has pain in both her knees.  She is traveling out of the state next week.  She denies any change in bowel bladder function.  She does report some groin pain on the left  side.  HPI  Review of Systems She currently denies any headache, chest pain, shortness of breath, fever, chills, nausea, vomiting  Objective: Vital Signs: There were no vitals taken for this visit.  Physical Exam She is alert and oriented x3 and in no acute distress but obvious discomfort Ortho Exam Examination of her low back does show positive straight leg raise on the left side.  She has significant radicular symptoms going down her sciatic area on her left side.  She has good strength in her feet.  Both knees show some slight swelling with pain.  Shoulders are painful on range of motion.  Reflexes normal bilateral lower extremities. Specialty Comments:  No specialty comments available.  Imaging: Xr Lumbar Spine 2-3 Views  Result Date: 09/14/2018 2 views lumbar spine shows significant degenerative changes throughout the lumbar spine at multiple levels.  There is a degenerative scoliosis and significant disc space narrowing.  There are paravertebral osteophytes at multiple levels.    PMFS History: Patient Active Problem List   Diagnosis Date Noted  . Pain in right foot 07/05/2017  . Chronic pain of right knee 07/05/2017  . Chronic pain of left knee 07/05/2017  . Unilateral primary osteoarthritis, left knee 07/05/2017  . Unilateral primary osteoarthritis, right knee 07/05/2017  . 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, right 04/14/2017   History reviewed. No pertinent past medical history.  Family History  Problem Relation Age of Onset  . Breast cancer Maternal Aunt        in 45's    History reviewed. No pertinent surgical history. Social History   Occupational History  . Not on file  Tobacco Use  . Smoking status: Never Smoker  . Smokeless tobacco: Never Used  Substance and Sexual Activity  . Alcohol use: Not on file  . Drug use: Not on file  . Sexual activity: Not on file

## 2018-09-15 ENCOUNTER — Telehealth (INDEPENDENT_AMBULATORY_CARE_PROVIDER_SITE_OTHER): Payer: Self-pay

## 2018-09-15 ENCOUNTER — Other Ambulatory Visit (INDEPENDENT_AMBULATORY_CARE_PROVIDER_SITE_OTHER): Payer: Self-pay

## 2018-09-15 ENCOUNTER — Telehealth (INDEPENDENT_AMBULATORY_CARE_PROVIDER_SITE_OTHER): Payer: Self-pay | Admitting: Orthopaedic Surgery

## 2018-09-15 DIAGNOSIS — M4807 Spinal stenosis, lumbosacral region: Secondary | ICD-10-CM

## 2018-09-15 NOTE — Telephone Encounter (Signed)
See below, is it possible to get her in for an MRI before she goes out of town?

## 2018-09-15 NOTE — Telephone Encounter (Signed)
See below

## 2018-09-15 NOTE — Telephone Encounter (Signed)
Patient called back and is requesting she get a MRI before she leaves to go on her trip Wednesday, if possible. Please advise  # [phone]

## 2018-09-15 NOTE — Telephone Encounter (Signed)
Give her re-assurance that she should do well on the medications.  I still have to leave it up to her if she wants to travel.  If things worsen before she leaves, then she should not go on the trip.

## 2018-09-15 NOTE — Telephone Encounter (Signed)
Patient was seen by Dr. Ninfa Linden yesterday, states he was running behind and didn't have time to discuss her back pain. She also said her groin pain is worse, numb and tingly and thinks its associated with her other problems she is having. She is traveling next week and wants to know what to do, she had an MRI ordered for her but understands this probably wont be done until she returns. She was given 3 types of medications but just wants to be sure she is good to travel and things "wont go [place]" while she is out of town. She would like a call back from [facility] or Dr. Ninfa Linden today if possible   # [phone]

## 2018-09-15 NOTE — Telephone Encounter (Signed)
If it can be scheduled, that will be fine.

## 2018-09-15 NOTE — Telephone Encounter (Signed)
Melissa [name] at [facility] called stating that someone had called the wrong facility for patient to have an MRI.  Stated that patient has an appointment at [facility].  Please advise.  Thank You.

## 2018-09-16 NOTE — Telephone Encounter (Signed)
noted 

## 2018-09-16 NOTE — Telephone Encounter (Signed)
I called pt and advised her that we could possibly send [facility] to [facility] for this Saturday and she agreed. I contacted scheduler there and she states they are full to the max for this Saturday but will have openings next Saturday. I advised pt of the above and she just wants to see if someone can see her for the pain in her groin/hip since she last saw [facility] she is feeling like it has gotten worse and would rather someone see it so she can point to exactly where she is hurting. Pt wanted to be seen before she goes OOT on Wednesday. Pt is scheduled with Artis Delay on Monday am.

## 2018-09-19 ENCOUNTER — Encounter (INDEPENDENT_AMBULATORY_CARE_PROVIDER_SITE_OTHER): Payer: Self-pay | Admitting: Physician Assistant

## 2018-09-19 ENCOUNTER — Telehealth (INDEPENDENT_AMBULATORY_CARE_PROVIDER_SITE_OTHER): Payer: Self-pay

## 2018-09-19 ENCOUNTER — Ambulatory Visit (INDEPENDENT_AMBULATORY_CARE_PROVIDER_SITE_OTHER): Payer: BLUE CROSS/BLUE SHIELD | Admitting: Physician Assistant

## 2018-09-19 ENCOUNTER — Other Ambulatory Visit (INDEPENDENT_AMBULATORY_CARE_PROVIDER_SITE_OTHER): Payer: Self-pay

## 2018-09-19 DIAGNOSIS — M25552 Pain in left hip: Secondary | ICD-10-CM | POA: Diagnosis not present

## 2018-09-19 NOTE — Telephone Encounter (Signed)
Patient is already scheduled for MRI of Lsp but Melissa [name] would like to add MR of Pelvis to this. Can you see if we can get added to the same day of her MR Lumbar.

## 2018-09-19 NOTE — Progress Notes (Signed)
HPI: Melissa [name] returns today after being seen by Dr. Ninfa Linden on 09/14/2018 and scheduled for an MRI of her lumbar spine.  She states she is now having more pain in her pelvis area in the anterior aspect of the groin.  But still having pain in her buttocks.  She states radicular symptoms down the left leg is resolved.  She has had no new injury.  Denies any bowel bladder dysfunction.  Physical exam: Lower extremities 5 out of 5 strength throughout against resistance.  Negative straight leg raise bilaterally.  She is able to fully flex lumbar spine touch toes.  She has no pain with extension.  She has good range of motion of both hips some slight discomfort with extremes motion of the left hip.  She has tenderness in the lower lumbar paraspinous region buttocks region and also inguinal region of the left hip.  Slight tenderness bilateral trochanteric regions.  Impression:  Low back pain Left hip pain  Plan: We will obtain an MRI of her pelvis to evaluate the left hip cartilage is source of her left hip and groin pain.  She will follow-up with Dr. Ninfa Linden after the MRI to go over results and discuss further treatment.  Questions encouraged and answered at length.

## 2018-09-20 NOTE — Telephone Encounter (Signed)
Waiting on pt insurance to change over to [place]. Once this has been changed will be able to try to get scheduled together

## 2018-09-29 ENCOUNTER — Ambulatory Visit
Admission: RE | Admit: 2018-09-29 | Discharge: 2018-09-29 | Disposition: A | Discharge status: Home / Self Care | Payer: Medicare HMO | Source: Ambulatory Visit | Attending: Orthopaedic Surgery | Admitting: Orthopaedic Surgery

## 2018-09-29 DIAGNOSIS — M48061 Spinal stenosis, lumbar region without neurogenic claudication: Secondary | ICD-10-CM | POA: Diagnosis not present

## 2018-09-29 DIAGNOSIS — M4807 Spinal stenosis, lumbosacral region: Secondary | ICD-10-CM

## 2018-09-30 ENCOUNTER — Telehealth (INDEPENDENT_AMBULATORY_CARE_PROVIDER_SITE_OTHER): Payer: Self-pay | Admitting: *Deleted

## 2018-09-30 NOTE — Telephone Encounter (Signed)
I received fax from [facility] with pt insurance stating they are requiring recent xray related to pelvis MRI request. I called pt advised her of this and pt has appt scheduled for Mon Nov 11 to see Dr. Ninfa Linden and will have that done at that time.

## 2018-10-03 ENCOUNTER — Other Ambulatory Visit (INDEPENDENT_AMBULATORY_CARE_PROVIDER_SITE_OTHER): Payer: Self-pay

## 2018-10-03 ENCOUNTER — Ambulatory Visit (INDEPENDENT_AMBULATORY_CARE_PROVIDER_SITE_OTHER): Payer: Medicare HMO | Admitting: Orthopaedic Surgery

## 2018-10-03 ENCOUNTER — Encounter (INDEPENDENT_AMBULATORY_CARE_PROVIDER_SITE_OTHER): Payer: Self-pay | Admitting: Orthopaedic Surgery

## 2018-10-03 DIAGNOSIS — M4807 Spinal stenosis, lumbosacral region: Secondary | ICD-10-CM

## 2018-10-03 DIAGNOSIS — M5442 Lumbago with sciatica, left side: Secondary | ICD-10-CM | POA: Diagnosis not present

## 2018-10-03 NOTE — Progress Notes (Signed)
The patient is here to go over an MRI of her lumbar spine.  She is having significant left-sided sciatica and left-sided low back pain.  She is also had groin pain on the left side.  Her hip x-rays were not impressive in terms of significant arthritis in her hip exam does not show any significant pain in the groin with internal extra rotation of her left hip.  She still does get groin pain.  She is feeling a little bit better since her last visit.  On exam she still has low back mediated pain with standing and flexion extension and seems to be facet joint mediated but there is a radicular component in the sciatic region.  She still has left-sided hip pain.  Not sure as a source of the hip pain the left side.  I can rotate her hip easily around and there is not significant bursa pain either.  The MRI of her lumbar spine does show a herniated disc at L5-S1 but is potentially irritating the L5 nerve.  The disc seems to be heading toward the foramina.  She has facet arthritis at that level but also at L4-L5.  At this point, she would like to try an epidural steroid injection I think this is reasonable.  I explained in some detail what this involves.  Also feel like she would benefit from an appointment with outpatient physical therapy and I gave her a prescription for [facility] physical therapy.  We will work on setting these things up and I will see her back in 4 weeks to see how she is doing overall.  All question concerns were answered and addressed.

## 2018-10-04 DIAGNOSIS — R69 Illness, unspecified: Secondary | ICD-10-CM | POA: Diagnosis not present

## 2018-10-05 ENCOUNTER — Ambulatory Visit (INDEPENDENT_AMBULATORY_CARE_PROVIDER_SITE_OTHER): Payer: BLUE CROSS/BLUE SHIELD | Admitting: Orthopaedic Surgery

## 2018-10-10 ENCOUNTER — Ambulatory Visit (INDEPENDENT_AMBULATORY_CARE_PROVIDER_SITE_OTHER): Payer: BLUE CROSS/BLUE SHIELD | Admitting: Orthopaedic Surgery

## 2018-10-17 ENCOUNTER — Ambulatory Visit (INDEPENDENT_AMBULATORY_CARE_PROVIDER_SITE_OTHER): Payer: Self-pay

## 2018-10-17 ENCOUNTER — Encounter (INDEPENDENT_AMBULATORY_CARE_PROVIDER_SITE_OTHER): Payer: Self-pay | Admitting: Physical Medicine and Rehabilitation

## 2018-10-17 ENCOUNTER — Ambulatory Visit (INDEPENDENT_AMBULATORY_CARE_PROVIDER_SITE_OTHER): Payer: Medicare HMO | Admitting: Physical Medicine and Rehabilitation

## 2018-10-17 VITALS — BP 146/81 | HR 82 | Temp 97.8°F

## 2018-10-17 DIAGNOSIS — M5416 Radiculopathy, lumbar region: Secondary | ICD-10-CM

## 2018-10-17 MED ORDER — BETAMETHASONE SOD PHOS & ACET 6 (3-3) MG/ML IJ SUSP
12.0000 mg | Freq: Once | INTRAMUSCULAR | Status: AC
  Administered 2018-10-17: 12 mg

## 2018-10-17 NOTE — Patient Instructions (Signed)

## 2018-10-17 NOTE — Progress Notes (Signed)
 .  Numeric Pain Rating Scale and Functional Assessment Average Pain 5   In the last MONTH (on 0-10 scale) has pain interfered with the following?  1. General activity like being  able to carry out your everyday physical activities such as walking, climbing stairs, carrying groceries, or moving a chair?  Rating(2)   +Driver, -BT, -Dye Allergies.   

## 2018-10-31 ENCOUNTER — Ambulatory Visit (INDEPENDENT_AMBULATORY_CARE_PROVIDER_SITE_OTHER): Payer: Medicare HMO | Admitting: Orthopaedic Surgery

## 2018-10-31 ENCOUNTER — Encounter (INDEPENDENT_AMBULATORY_CARE_PROVIDER_SITE_OTHER): Payer: Self-pay | Admitting: Orthopaedic Surgery

## 2018-10-31 DIAGNOSIS — M25561 Pain in right knee: Secondary | ICD-10-CM | POA: Diagnosis not present

## 2018-10-31 DIAGNOSIS — M1711 Unilateral primary osteoarthritis, right knee: Secondary | ICD-10-CM

## 2018-10-31 DIAGNOSIS — M1712 Unilateral primary osteoarthritis, left knee: Secondary | ICD-10-CM | POA: Diagnosis not present

## 2018-10-31 DIAGNOSIS — G8929 Other chronic pain: Secondary | ICD-10-CM | POA: Diagnosis not present

## 2018-10-31 DIAGNOSIS — M4807 Spinal stenosis, lumbosacral region: Secondary | ICD-10-CM | POA: Diagnosis not present

## 2018-10-31 DIAGNOSIS — M5442 Lumbago with sciatica, left side: Secondary | ICD-10-CM

## 2018-10-31 DIAGNOSIS — Z23 Encounter for immunization: Secondary | ICD-10-CM | POA: Diagnosis not present

## 2018-10-31 DIAGNOSIS — M25562 Pain in left knee: Secondary | ICD-10-CM | POA: Diagnosis not present

## 2018-10-31 DIAGNOSIS — R69 Illness, unspecified: Secondary | ICD-10-CM | POA: Diagnosis not present

## 2018-10-31 MED ORDER — LIDOCAINE HCL 1 % IJ SOLN
3.0000 mL | INTRAMUSCULAR | Status: AC | PRN
  Administered 2018-10-31: 3 mL

## 2018-10-31 MED ORDER — METHYLPREDNISOLONE ACETATE 40 MG/ML IJ SUSP
40.0000 mg | INTRAMUSCULAR | Status: AC | PRN
  Administered 2018-10-31: 40 mg via INTRA_ARTICULAR

## 2018-10-31 NOTE — Progress Notes (Signed)
Office Visit Note   Patient: Melissa [name]           Date of Birth: 30-Aug-1951           MRN: [id number] Visit Date: 10/31/2018              Requested by: [facility] [address], [place] [address] PCP: [facility]   Assessment & Plan: Visit Diagnoses:  1. Left-sided low back pain with left-sided sciatica, unspecified chronicity   2. Spinal stenosis, lumbosacral region   3. Chronic pain of right knee   4. Chronic pain of left knee   5. Unilateral primary osteoarthritis, left knee   6. Unilateral primary osteoarthritis, right knee     Plan: Per her wishes I did provide steroid injections in both knees today.  Explained in detail the risk and benefit of injections.  She has had them before and tolerated them well.  We will see her back in 3 months to see how she is doing overall to consider also setting her up for a right knee replacement.  Follow-Up Instructions: Return in about 3 months (around 01/30/2019).   Orders:  Orders Placed This Encounter  Procedures  . Large Joint Inj  . Large Joint Inj   No orders of the defined types were placed in this encounter.     Procedures: Large Joint Inj: R knee on 10/31/2018 8:36 AM Indications: diagnostic evaluation and pain Details: 22 G 1.5 in needle, superolateral approach  Arthrogram: No  Medications: 3 mL lidocaine 1 %; 40 mg methylPREDNISolone acetate 40 MG/ML Outcome: tolerated well, no immediate complications Procedure, treatment alternatives, risks and benefits explained, specific risks discussed. Consent was given by the patient. Immediately prior to procedure a time out was called to verify the correct patient, procedure, equipment, support staff and site/side marked as required. Patient was prepped and draped in the usual sterile fashion.   Large Joint Inj: L knee on 10/31/2018 8:36 AM Indications: diagnostic evaluation and pain Details: 22 G  1.5 in needle, superolateral approach  Arthrogram: No  Medications: 3 mL lidocaine 1 %; 40 mg methylPREDNISolone acetate 40 MG/ML Outcome: tolerated well, no immediate complications Procedure, treatment alternatives, risks and benefits explained, specific risks discussed. Consent was given by the patient. Immediately prior to procedure a time out was called to verify the correct patient, procedure, equipment, support staff and site/side marked as required. Patient was prepped and draped in the usual sterile fashion.       Clinical Data: No additional findings.   Subjective: Chief Complaint  Patient presents with  . Lower Back - Follow-up  The patient is well-known to me.  She is in follow-up after having epidural steroid injection recently by Dr. Ernestina Patches at the left side.  She also has chronic bilateral knee pain and well-documented severe osteoarthritis in both knees with the right being worse than left.  It been a while since he injected her knees.  She would like to consider potentially a right total knee arthroplasty at the end of the academic year which would be in May 2020.  She says overall though she is doing well and working with a Physiological scientist.  The sciatic symptoms have dissipated quite a bit.  She has minimal groin pain as well on the left side.  Both knees hurt with weightbearing activities and mobility.  HPI  Review of Systems She currently denies any headache, chest pain, shortness of  breath, fever, chills, nausea, vomiting.  Objective: Vital Signs: There were no vitals taken for this visit.  Physical Exam She is alert and orient x3 and in no acute distress Ortho Exam Examination of both her knees shows slight valgus malalignment of the right knee and slight varus of the left knee.  Both knees have no effusion but painful range of motion.  Both knees are ligamentously stable.  She has negative straight leg raise on the left side and decreased pain in the sciatic  region as well as the trochanteric area and left hip in general. Specialty Comments:  No specialty comments available.  Imaging: No results found. Previous x-rays of her knees from 2018 show tricompartmental arthritic changes which is quite severe on the right side and moderate to severe on the left side.  PMFS History: Patient Active Problem List   Diagnosis Date Noted  . Pain in right foot 07/05/2017  . Chronic pain of right knee 07/05/2017  . Chronic pain of left knee 07/05/2017  . Unilateral primary osteoarthritis, left knee 07/05/2017  . Unilateral primary osteoarthritis, right knee 07/05/2017  . 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, right 04/14/2017   History reviewed. No pertinent past medical history.  Family History  Problem Relation Age of Onset  . Breast cancer Maternal Aunt        in 1's    History reviewed. No pertinent surgical history. Social History   Occupational History  . Not on file  Tobacco Use  . Smoking status: Never Smoker  . Smokeless tobacco: Never Used  Substance and Sexual Activity  . Alcohol use: Not on file  . Drug use: Not on file  . Sexual activity: Not on file

## 2018-11-07 ENCOUNTER — Telehealth: Payer: Self-pay

## 2018-11-07 MED ORDER — ATORVASTATIN CALCIUM 20 MG PO TABS: 20.0000 mg | ORAL_TABLET | Freq: Every day | ORAL | 0 refills | Status: DC

## 2018-11-07 NOTE — Telephone Encounter (Signed)
Rx for atorvastatin 20mg  one tablet daily #30 with 0 refills sent to pharmacy as requested.  Patient was due to follow up with Dr Wynonia Lawman on an as needed basis.  Requested future refills to be sent to PCP.

## 2018-11-09 NOTE — Progress Notes (Signed)
Melissa [name] - 67 y.o. female MRN [id number]  Date of birth: 1951-05-03  Office Visit Note: Visit Date: 10/17/2018 PCP: [facility] Referred by: [facility]  Subjective: Chief Complaint  Patient presents with  . Lower Back - Pain  . Left Hip - Pain  . Left Thigh - Pain   HPI:  Melissa [name] is a 67 y.o. female who comes in today For planned left L5 transforaminal epidural steroid injection as requested by Dr. Jean Rosenthal.  She has been followed closely by him and failed conservative care otherwise.  She does have some groin pain but really has fluid movement of the hips on his exam and my exam.  MRI of the lumbar spine reviewed below shows multifactorial stenosis at L4-5 with disc protrusion towards the left at L5-S1 with some foraminal narrowing.  We are going to complete a left L5 injection diagnostically hopefully therapeutically but would consider L4 injection for the stenosis.  ROS Otherwise per HPI.  Assessment & Plan: Visit Diagnoses:  1. Lumbar radiculopathy     Plan: No additional findings.   Meds & Orders:  Meds ordered this encounter  Medications  . betamethasone acetate-betamethasone sodium phosphate (CELESTONE) injection 12 mg    Orders Placed This Encounter  Procedures  . XR C-ARM NO REPORT  . Epidural Steroid injection    Follow-up: Return if symptoms worsen or fail to improve.   Procedures: No procedures performed  Lumbosacral Transforaminal Epidural Steroid Injection - Sub-Pedicular Approach with Fluoroscopic Guidance  Patient: Melissa [name]      Date of Birth: 1951-11-11 MRN: [id number] PCP: [facility]      Visit Date: 10/17/2018   Universal Protocol:    Date/Time: 10/17/2018  Consent Given By: the patient  Position: PRONE  Additional Comments: Vital signs were monitored before and after the procedure. Patient was prepped and draped in the usual  sterile fashion. The correct patient, procedure, and site was verified.   Injection Procedure Details:  Procedure Site One Meds Administered:  Meds ordered this encounter  Medications  . betamethasone acetate-betamethasone sodium phosphate (CELESTONE) injection 12 mg    Laterality: Left  Location/Site:  L5-S1  Needle size: 22 G  Needle type: Spinal  Needle Placement: Transforaminal  Findings:    -Comments: Excellent flow of contrast along the nerve and into the epidural space.  Procedure Details: After squaring off the end-plates to get a true AP view, the C-arm was positioned so that an oblique view of the foramen as noted above was visualized. The target area is just inferior to the "nose of the scotty dog" or sub pedicular. The soft tissues overlying this structure were infiltrated with 2-3 ml. of 1% Lidocaine without Epinephrine.  The spinal needle was inserted toward the target using a "trajectory" view along the fluoroscope beam.  Under AP and lateral visualization, the needle was advanced so it did not puncture dura and was located close the 6 O'Clock position of the pedical in AP tracterory. Biplanar projections were used to confirm position. Aspiration was confirmed to be negative for CSF and/or blood. A 1-2 ml. volume of Isovue-250 was injected and flow of contrast was noted at each level. Radiographs were obtained for documentation purposes.   After attaining the desired flow of contrast documented above, a 0.5 to 1.0 ml test dose of 0.25% Marcaine was injected into each respective transforaminal space.  The patient was observed for 90 seconds post injection.  After no sensory deficits were reported, and normal lower extremity motor function was noted,   the above injectate was administered so that equal amounts of the injectate were placed at each foramen (level) into the transforaminal epidural space.   Additional Comments:  The patient tolerated the procedure  well Dressing: Band-Aid    Post-procedure details: Patient was observed during the procedure. Post-procedure instructions were reviewed.  Patient left the clinic in stable condition.     Clinical History: MRI LUMBAR SPINE WITHOUT CONTRAST  TECHNIQUE: Multiplanar, multisequence MR imaging of the lumbar spine was performed. No intravenous contrast was administered.  COMPARISON:  Lumbar radiography 09/14/2018  FINDINGS: Segmentation: 5 lumbar type vertebral bodies based on comparison radiography  Alignment: There is mild scoliosis. Grade 1 anterolisthesis at L4-5.  Vertebrae:  No fracture, evidence of discitis, or bone lesion.  Conus medullaris and cauda equina: Conus extends to the L1 level. Conus and cauda equina appear normal.  Paraspinal and other soft tissues: Negative  Disc levels:  T12- L1: Unremarkable.  L1-L2: Disc narrowing and circumferential bulging with endplate spur. Mild facet spurring.  L2-L3: Disc narrowing and bulging asymmetric to the left. Mild facet hypertrophy. No impingement  L3-L4: Mild facet spurring. Disc narrowing and circumferential bulging. Mild spinal stenosis.  L4-L5: Advanced facet arthropathy with spurring and anterolisthesis. The disc is narrowed and bulging preferentially towards the right. Degenerative changes and dorsal epidural fat expansion causes moderate to advanced spinal stenosis. There is right foraminal impingement with L4 flattening.  L5-S1:Facet degeneration with moderate spurring. Mild disc narrowing and bulging. There is a left foraminal herniation impinging on the L5 nerve root-see sagittal sequences  IMPRESSION: 1. Disc and facet degeneration with L4-5 anterolisthesis and mild scoliosis. 2. On the symptomatic left side there is L5 foraminal impingement due to disc protrusion. 3. L4-5 moderate to advanced spinal stenosis. Right foraminal impingement.   Electronically Signed   By:  Monte Fantasia M.D.   On: 09/29/2018 12:49     Objective:  VS:  HT:    WT:   BMI:     BP:(!) 146/81  HR:82bpm  TEMP:97.8 F (36.6 C)(Oral)  RESP:  Physical Exam  Ortho Exam Imaging: No results found.

## 2018-11-09 NOTE — Procedures (Signed)
Lumbosacral Transforaminal Epidural Steroid Injection - Sub-Pedicular Approach with Fluoroscopic Guidance  Patient: Melissa [name]      Date of Birth: 1951/07/10 MRN: [id number] PCP: [facility]      Visit Date: 10/17/2018   Universal Protocol:    Date/Time: 10/17/2018  Consent Given By: the patient  Position: PRONE  Additional Comments: Vital signs were monitored before and after the procedure. Patient was prepped and draped in the usual sterile fashion. The correct patient, procedure, and site was verified.   Injection Procedure Details:  Procedure Site One Meds Administered:  Meds ordered this encounter  Medications  . betamethasone acetate-betamethasone sodium phosphate (CELESTONE) injection 12 mg    Laterality: Left  Location/Site:  L5-S1  Needle size: 22 G  Needle type: Spinal  Needle Placement: Transforaminal  Findings:    -Comments: Excellent flow of contrast along the nerve and into the epidural space.  Procedure Details: After squaring off the end-plates to get a true AP view, the C-arm was positioned so that an oblique view of the foramen as noted above was visualized. The target area is just inferior to the "nose of the scotty dog" or sub pedicular. The soft tissues overlying this structure were infiltrated with 2-3 ml. of 1% Lidocaine without Epinephrine.  The spinal needle was inserted toward the target using a "trajectory" view along the fluoroscope beam.  Under AP and lateral visualization, the needle was advanced so it did not puncture dura and was located close the 6 O'Clock position of the pedical in AP tracterory. Biplanar projections were used to confirm position. Aspiration was confirmed to be negative for CSF and/or blood. A 1-2 ml. volume of Isovue-250 was injected and flow of contrast was noted at each level. Radiographs were obtained for documentation purposes.   After attaining the desired flow of contrast  documented above, a 0.5 to 1.0 ml test dose of 0.25% Marcaine was injected into each respective transforaminal space.  The patient was observed for 90 seconds post injection.  After no sensory deficits were reported, and normal lower extremity motor function was noted,   the above injectate was administered so that equal amounts of the injectate were placed at each foramen (level) into the transforaminal epidural space.   Additional Comments:  The patient tolerated the procedure well Dressing: Band-Aid    Post-procedure details: Patient was observed during the procedure. Post-procedure instructions were reviewed.  Patient left the clinic in stable condition.

## 2018-11-11 ENCOUNTER — Other Ambulatory Visit: Payer: Self-pay | Admitting: Cardiology

## 2018-11-11 MED ORDER — ATORVASTATIN CALCIUM 20 MG PO TABS: 20.0000 mg | ORAL_TABLET | Freq: Every day | ORAL | 0 refills | Status: DC

## 2018-11-11 NOTE — Telephone Encounter (Signed)
° ° °  1. Which medications need to be refilled? (please list name of each medication and dose if known) Atorvastatin 20mg  tablet 1QD  2. Which pharmacy/location (including street and city if local pharmacy) is medication to be sent to?[id number]  3. Do they need a 30 day or 90 day supply? [place]

## 2018-11-11 NOTE — Telephone Encounter (Signed)
Atorvastatin 20 mg daily refilled per Dr. Agustin Cree

## 2018-11-14 ENCOUNTER — Telehealth: Payer: Self-pay | Admitting: Cardiology

## 2018-11-14 NOTE — Telephone Encounter (Signed)
° °  1. Which medications need to be refilled? (please list name of each medication and dose if known) Atorvastatin 20mg  tablets 1QD  2. Which pharmacy/location (including street and city if local pharmacy) is medication to be sent to? [facility] [address] [facility]  3. Do they need a 30 day or 90 day supply? [place]

## 2018-12-05 DIAGNOSIS — E785 Hyperlipidemia, unspecified: Secondary | ICD-10-CM | POA: Diagnosis not present

## 2018-12-05 DIAGNOSIS — E039 Hypothyroidism, unspecified: Secondary | ICD-10-CM | POA: Diagnosis not present

## 2018-12-05 DIAGNOSIS — R7303 Prediabetes: Secondary | ICD-10-CM | POA: Diagnosis not present

## 2018-12-06 DIAGNOSIS — M8580 Other specified disorders of bone density and structure, unspecified site: Secondary | ICD-10-CM | POA: Diagnosis not present

## 2018-12-06 DIAGNOSIS — Z0000 Encounter for general adult medical examination without abnormal findings: Secondary | ICD-10-CM | POA: Diagnosis not present

## 2018-12-06 DIAGNOSIS — M545 Low back pain: Secondary | ICD-10-CM | POA: Diagnosis not present

## 2018-12-06 DIAGNOSIS — Z23 Encounter for immunization: Secondary | ICD-10-CM | POA: Diagnosis not present

## 2018-12-06 DIAGNOSIS — R7303 Prediabetes: Secondary | ICD-10-CM | POA: Diagnosis not present

## 2018-12-06 DIAGNOSIS — E039 Hypothyroidism, unspecified: Secondary | ICD-10-CM | POA: Diagnosis not present

## 2018-12-06 DIAGNOSIS — Z1211 Encounter for screening for malignant neoplasm of colon: Secondary | ICD-10-CM | POA: Diagnosis not present

## 2018-12-06 DIAGNOSIS — I10 Essential (primary) hypertension: Secondary | ICD-10-CM | POA: Diagnosis not present

## 2018-12-06 DIAGNOSIS — M170 Bilateral primary osteoarthritis of knee: Secondary | ICD-10-CM | POA: Diagnosis not present

## 2018-12-06 DIAGNOSIS — E559 Vitamin D deficiency, unspecified: Secondary | ICD-10-CM | POA: Diagnosis not present

## 2018-12-07 ENCOUNTER — Other Ambulatory Visit: Payer: Self-pay | Admitting: Family Medicine

## 2018-12-07 DIAGNOSIS — M8580 Other specified disorders of bone density and structure, unspecified site: Secondary | ICD-10-CM

## 2018-12-07 DIAGNOSIS — Z1231 Encounter for screening mammogram for malignant neoplasm of breast: Secondary | ICD-10-CM

## 2019-01-12 ENCOUNTER — Ambulatory Visit (INDEPENDENT_AMBULATORY_CARE_PROVIDER_SITE_OTHER): Payer: Medicare HMO | Admitting: Orthopaedic Surgery

## 2019-01-16 ENCOUNTER — Encounter (INDEPENDENT_AMBULATORY_CARE_PROVIDER_SITE_OTHER): Payer: Self-pay | Admitting: Orthopaedic Surgery

## 2019-01-16 ENCOUNTER — Ambulatory Visit (INDEPENDENT_AMBULATORY_CARE_PROVIDER_SITE_OTHER): Payer: Medicare HMO | Admitting: Orthopaedic Surgery

## 2019-01-16 DIAGNOSIS — M1711 Unilateral primary osteoarthritis, right knee: Secondary | ICD-10-CM | POA: Diagnosis not present

## 2019-01-16 DIAGNOSIS — M1712 Unilateral primary osteoarthritis, left knee: Secondary | ICD-10-CM | POA: Diagnosis not present

## 2019-01-16 NOTE — Progress Notes (Signed)
The patient is here today for annual follow-up for her knees.  She has known well-documented severe osteoarthritis in both knees.  We just injected again in December so is too early to inject her knees today.  She is hoping to have this done today.  She is not a diabetic.  At this point her pain is daily and it is detrimentally factor mobility, quality of life and her actives daily living.  On exam she has a slight effusion of the right knee but none on the left knee.  Her left knee has slight varus malalignment but her right knee has valgus malalignment.  Her x-rays of both knees were almost 2 years ago and that showed severe end-stage arthritis of both knees then.  This point she was consider at least a right knee replacement sometime in late April.  With that being said I would like to see her back in 2 weeks and I will inject both knees with a steroid at that visit.  We will set her up for a knee replacement surgery for likely her right knee.  At her next visit I would like an AP and lateral of each knee for surgical templating and to see how worse her arthritis is gotten.

## 2019-01-30 ENCOUNTER — Ambulatory Visit (INDEPENDENT_AMBULATORY_CARE_PROVIDER_SITE_OTHER): Payer: Self-pay

## 2019-01-30 ENCOUNTER — Ambulatory Visit (INDEPENDENT_AMBULATORY_CARE_PROVIDER_SITE_OTHER): Payer: Medicare HMO | Admitting: Orthopaedic Surgery

## 2019-01-30 ENCOUNTER — Ambulatory Visit (INDEPENDENT_AMBULATORY_CARE_PROVIDER_SITE_OTHER): Payer: Medicare HMO

## 2019-01-30 VITALS — Ht 63.0 in | Wt 180.0 lb

## 2019-01-30 DIAGNOSIS — G8929 Other chronic pain: Secondary | ICD-10-CM

## 2019-01-30 DIAGNOSIS — M25562 Pain in left knee: Secondary | ICD-10-CM

## 2019-01-30 DIAGNOSIS — M1711 Unilateral primary osteoarthritis, right knee: Secondary | ICD-10-CM

## 2019-01-30 DIAGNOSIS — M1712 Unilateral primary osteoarthritis, left knee: Secondary | ICD-10-CM | POA: Diagnosis not present

## 2019-01-30 DIAGNOSIS — M25561 Pain in right knee: Secondary | ICD-10-CM

## 2019-01-30 MED ORDER — LIDOCAINE HCL 1 % IJ SOLN
3.0000 mL | INTRAMUSCULAR | Status: AC | PRN
  Administered 2019-01-30: 3 mL

## 2019-01-30 MED ORDER — METHYLPREDNISOLONE ACETATE 40 MG/ML IJ SUSP
40.0000 mg | INTRAMUSCULAR | Status: AC | PRN
  Administered 2019-01-30: 40 mg via INTRA_ARTICULAR

## 2019-01-30 NOTE — Progress Notes (Signed)
Office Visit Note   Patient: Melissa [name]           Date of Birth: [birth year]           MRN: [id number] Visit Date: 01/30/2019              Requested by: [facility] [place], [place] [address] PCP: [facility]   Assessment & Plan: Visit Diagnoses:  1. Chronic pain of left knee   2. Chronic pain of right knee   3. Unilateral primary osteoarthritis, left knee   4. Unilateral primary osteoarthritis, right knee     Plan: At this point we will proceed with a knee replacement on her right knee.  This will be at the end of April or early May so I was fine with putting a steroid injection in both knees today per her request to get her through this period of time.  All question concerns were answered and addressed.  We talked about her intraoperative and postoperative course and what surgery involves.  She would like to convalesce at a skilled nursing facility postoperative as well.  Follow-Up Instructions: Return for 2 weeks post-op.   Orders:  Orders Placed This Encounter  Procedures  . Large Joint Inj  . Large Joint Inj  . XR Knee 1-2 Views Left  . XR Knee 1-2 Views Right   No orders of the defined types were placed in this encounter.     Procedures: Large Joint Inj: R knee on 01/30/2019 8:59 AM Indications: diagnostic evaluation and pain Details: 22 G 1.5 in needle, superolateral approach  Arthrogram: No  Medications: 3 mL lidocaine 1 %; 40 mg methylPREDNISolone acetate 40 MG/ML Outcome: tolerated well, no immediate complications Procedure, treatment alternatives, risks and benefits explained, specific risks discussed. Consent was given by the patient. Immediately prior to procedure a time out was called to verify the correct patient, procedure, equipment, support staff and site/side marked as required. Patient was prepped and draped in the usual sterile fashion.   Large Joint Inj: L knee on  01/30/2019 9:00 AM Indications: diagnostic evaluation and pain Details: 22 G 1.5 in needle, superolateral approach  Arthrogram: No  Medications: 3 mL lidocaine 1 %; 40 mg methylPREDNISolone acetate 40 MG/ML Outcome: tolerated well, no immediate complications Procedure, treatment alternatives, risks and benefits explained, specific risks discussed. Consent was given by the patient. Immediately prior to procedure a time out was called to verify the correct patient, procedure, equipment, support staff and site/side marked as required. Patient was prepped and draped in the usual sterile fashion.       Clinical Data: No additional findings.   Subjective: Chief Complaint  Patient presents with  . Left Knee - Pain  . Right Knee - Pain  The patient comes in today with severe bilateral knee pain and known osteoarthritis which is quite severe.  She does want to find a schedule a right total knee arthroplasty for late April or early May of this year.  We have seen her for multiple years now.  She is worked on activity modification as well as quad strengthening exercises.  She is had multiple injections with steroids and hyaluronic acid.  At this point her pain is daily and it is detrimentally factor mobility her quality of life and her activities of daily living.  It is at the point where she does wish to leave the knee replacement surgery.  In the past  I have shown her knee model and explained in detail what knee replacement surgery involves.  We discussed the risk and benefits in detail and talked about her intraoperative and postoperative course.  HPI  Review of Systems She currently denies any chest pain, shortness of breath, fever, chills, nausea, vomiting  Objective: Vital Signs: Ht 5\' 3"  (1.6 m)   Wt 180 lb (81.6 kg)   BMI 31.89 kg/m   Physical Exam She is alert and oriented x3 and in no acute distress Ortho Exam Examination of both knees show full range of motion.  Both knees have  mild to moderate effusions.  The right knee has valgus malalignment the left knee has varus malalignment.  Both knees have global tenderness with good range of motion.  Both knees are ligamentously stable.  Both knees have significant patellofemoral crepitation. Specialty Comments:  No specialty comments available.  Imaging: Xr Knee 1-2 Views Left  Result Date: 01/30/2019 2 views of the left knee show severe end-stage arthritis with varus malalignment and particular osteophytes in all 3 compartments.  There is almost complete loss of medial joint space.  Xr Knee 1-2 Views Right  Result Date: 01/30/2019 2 views of the right knee show severe end-stage arthritis with valgus malalignment and complete loss of    PMFS History: Patient Active Problem List   Diagnosis Date Noted  . Pain in right foot 07/05/2017  . Chronic pain of right knee 07/05/2017  . Chronic pain of left knee 07/05/2017  . Unilateral primary osteoarthritis, left knee 07/05/2017  . Unilateral primary osteoarthritis, right knee 07/05/2017  . 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  Family History  Problem Relation Age of Onset  . Breast cancer Maternal Aunt        in 80's    No past surgical history on file. Social History   Occupational History  . Not on file  Tobacco Use  . Smoking status: Never Smoker  . Smokeless tobacco: Never Used  Substance and Sexual Activity  . Alcohol use: Not on file  . Drug use: Not on file  . Sexual activity: Not on file

## 2019-02-20 ENCOUNTER — Ambulatory Visit: Payer: Medicare HMO

## 2019-02-20 ENCOUNTER — Other Ambulatory Visit: Payer: Medicare HMO

## 2019-04-05 ENCOUNTER — Other Ambulatory Visit (INDEPENDENT_AMBULATORY_CARE_PROVIDER_SITE_OTHER): Payer: Self-pay | Admitting: Orthopaedic Surgery

## 2019-04-10 ENCOUNTER — Telehealth: Payer: Self-pay | Admitting: Orthopaedic Surgery

## 2019-04-10 ENCOUNTER — Inpatient Hospital Stay: Payer: Self-pay | Admitting: Orthopaedic Surgery

## 2019-04-10 NOTE — Telephone Encounter (Signed)
It is okay to schedule her for to come in for a steroid injection in her knee if she would like while we wait to schedule her surgery.  Also, for this note to Samella Parr so Judeen Hammans can also call her to let her have an idea when surgery can be scheduled.

## 2019-04-10 NOTE — Telephone Encounter (Signed)
Patient called in wanting to know when she could get her next injection. Also wanting to know what the doctor wants to do as far her getting surgery. She would like for doctor to give her a call because she is suppose to go to rehab soon and doesn't feel comfortable going.

## 2019-04-10 NOTE — Telephone Encounter (Signed)
Please advise. Per chart, patient is to be scheduled for Right TKA.

## 2019-04-11 NOTE — Telephone Encounter (Signed)
I left voicemail for patient to call and schedule cortisone injection in knee if she prefers.  I also advised I would send this message, per Dr. Ninfa Linden, to [facility] to get in touch with her to discuss scheduling of her surgery.

## 2019-04-14 NOTE — Telephone Encounter (Signed)
I called patient.  She is going to hold off on surgery until possibly next year.  Too concerned about doing sooner and having to go to SNF post op with the Corona Virus.  She made an appointment for 05/02/19 to get an injection.

## 2019-05-02 ENCOUNTER — Telehealth: Payer: Self-pay

## 2019-05-02 ENCOUNTER — Ambulatory Visit (INDEPENDENT_AMBULATORY_CARE_PROVIDER_SITE_OTHER): Payer: Medicare HMO | Admitting: Orthopaedic Surgery

## 2019-05-02 ENCOUNTER — Encounter: Payer: Self-pay | Admitting: Orthopaedic Surgery

## 2019-05-02 ENCOUNTER — Other Ambulatory Visit: Payer: Self-pay

## 2019-05-02 DIAGNOSIS — M1711 Unilateral primary osteoarthritis, right knee: Secondary | ICD-10-CM

## 2019-05-02 DIAGNOSIS — G8929 Other chronic pain: Secondary | ICD-10-CM | POA: Diagnosis not present

## 2019-05-02 DIAGNOSIS — M25561 Pain in right knee: Secondary | ICD-10-CM | POA: Diagnosis not present

## 2019-05-02 DIAGNOSIS — M25562 Pain in left knee: Secondary | ICD-10-CM | POA: Diagnosis not present

## 2019-05-02 DIAGNOSIS — M1712 Unilateral primary osteoarthritis, left knee: Secondary | ICD-10-CM

## 2019-05-02 MED ORDER — LIDOCAINE HCL 1 % IJ SOLN
3.0000 mL | INTRAMUSCULAR | Status: AC | PRN
  Administered 2019-05-02: 3 mL

## 2019-05-02 MED ORDER — METHYLPREDNISOLONE ACETATE 40 MG/ML IJ SUSP
40.0000 mg | INTRAMUSCULAR | Status: AC | PRN
  Administered 2019-05-02: 40 mg via INTRA_ARTICULAR

## 2019-05-02 NOTE — Telephone Encounter (Signed)
Bilateral knee injections  

## 2019-05-02 NOTE — Progress Notes (Signed)
Office Visit Note   Patient: Melissa [name]           Date of Birth: 03/15/1951           MRN: [id number] Visit Date: 05/02/2019              Requested by: [facility] [address], [place] [address] PCP: [facility]   Assessment & Plan: Visit Diagnoses:  1. Unilateral primary osteoarthritis, left knee   2. Unilateral primary osteoarthritis, right knee   3. Chronic pain of left knee   4. Chronic pain of right knee     Plan: Per her wishes I did provide a steroid injection in both knees today.  We had a thorough discussion about the risk and benefits of steroid injections.  She is the perfect candidate for trying hyaluronic acid injections for her knees.  We will order this for both knees.  All question concerns were answered and addressed.  Follow-up will be in 4 weeks to hopefully place hyaluronic acid in both knees.  Follow-Up Instructions: Return in about 4 weeks (around 05/30/2019).   Orders:  Orders Placed This Encounter  Procedures  . Large Joint Inj  . Large Joint Inj   No orders of the defined types were placed in this encounter.     Procedures: Large Joint Inj: R knee on 05/02/2019 8:55 AM Indications: diagnostic evaluation and pain Details: 22 G 1.5 in needle, superolateral approach  Arthrogram: No  Medications: 3 mL lidocaine 1 %; 40 mg methylPREDNISolone acetate 40 MG/ML Outcome: tolerated well, no immediate complications Procedure, treatment alternatives, risks and benefits explained, specific risks discussed. Consent was given by the patient. Immediately prior to procedure a time out was called to verify the correct patient, procedure, equipment, support staff and site/side marked as required. Patient was prepped and draped in the usual sterile fashion.   Large Joint Inj: L knee on 05/02/2019 8:55 AM Indications: diagnostic evaluation and pain Details: 22 G 1.5 in needle,  superolateral approach  Arthrogram: No  Medications: 3 mL lidocaine 1 %; 40 mg methylPREDNISolone acetate 40 MG/ML Outcome: tolerated well, no immediate complications Procedure, treatment alternatives, risks and benefits explained, specific risks discussed. Consent was given by the patient. Immediately prior to procedure a time out was called to verify the correct patient, procedure, equipment, support staff and site/side marked as required. Patient was prepped and draped in the usual sterile fashion.       Clinical Data: No additional findings.   Subjective: Chief Complaint  Patient presents with  . Left Knee - Follow-up  . Right Knee - Follow-up  Patient is a very pleasant 68 year old female well-known to me.  She has known tricompartmental osteoarthritis in both knees.  She is still reluctant to proceed with knee replacement surgery in hopes that steroid injections again today followed by hyaluronic acid injections in the month can help alleviate some of her symptoms.  She does take anti-inflammatories when she can.  She also takes supplements such as glucosamine and turmeric.  Her pain is daily and is detriment affecting her mobility, her quality of life and her actives daily living.  In light of the coronavirus pandemic she is reluctant to proceed with any type of surgery right now due to concerns about her job as well as needing to rehab at a skilled nursing facility postoperative.  HPI  Review of Systems She currently denies any headache, chest pain, shortness  of breath, fever, chills, nausea, vomiting  Objective: Vital Signs: There were no vitals taken for this visit.  Physical Exam She is alert and orient x3 and in no acute distress Ortho Exam Examination of both knees shows medial and lateral joint line tenderness with slight effusions.  Both knees have excellent range of motion with patellofemoral crepitation. Specialty Comments:  No specialty comments available.   Imaging: No results found. X-rays that we have on our system were reviewed again it shows tricompartment arthritis of both knees.  The right knee has significant lateral joint space narrowing with the left knee showing significant medial joint space narrowing.  PMFS History: Patient Active Problem List   Diagnosis Date Noted  . Pain in right foot 07/05/2017  . Chronic pain of right knee 07/05/2017  . Chronic pain of left knee 07/05/2017  . Unilateral primary osteoarthritis, left knee 07/05/2017  . Unilateral primary osteoarthritis, right knee 07/05/2017  . 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, right 04/14/2017   History reviewed. No pertinent past medical history.  Family History  Problem Relation Age of Onset  . Breast cancer Maternal Aunt        in 64's    History reviewed. No pertinent surgical history. Social History   Occupational History  . Not on file  Tobacco Use  . Smoking status: Never Smoker  . Smokeless tobacco: Never Used  Substance and Sexual Activity  . Alcohol use: Not on file  . Drug use: Not on file  . Sexual activity: Not on file

## 2019-05-03 ENCOUNTER — Telehealth: Payer: Self-pay

## 2019-05-03 NOTE — Telephone Encounter (Signed)
Noted  

## 2019-05-03 NOTE — Telephone Encounter (Signed)
Submitted VOB for Gel-One, bilateral knee.  

## 2019-05-10 ENCOUNTER — Telehealth: Payer: Self-pay

## 2019-05-10 NOTE — Telephone Encounter (Signed)
Patient is Approved for Gel-One, bilateral knee. [place] Patient will be responsible for 20% OOP. $25.00 Co-pay PA required PA Approval# [id number] Valid 05/03/2019- 08/03/2019  Appt. 05/30/2019 with Dr. Ninfa Linden

## 2019-05-30 ENCOUNTER — Other Ambulatory Visit: Payer: Self-pay

## 2019-05-30 ENCOUNTER — Ambulatory Visit (INDEPENDENT_AMBULATORY_CARE_PROVIDER_SITE_OTHER): Payer: Medicare HMO | Admitting: Orthopaedic Surgery

## 2019-05-30 DIAGNOSIS — M1712 Unilateral primary osteoarthritis, left knee: Secondary | ICD-10-CM

## 2019-05-30 DIAGNOSIS — M1711 Unilateral primary osteoarthritis, right knee: Secondary | ICD-10-CM | POA: Diagnosis not present

## 2019-05-30 MED ORDER — CROSS-LINK HYAL ACID (VISC) 30 MG/3ML IX PRSY
30.0000 mg | PREFILLED_SYRINGE | INTRA_ARTICULAR | Status: AC | PRN
  Administered 2019-05-30: 30 mg via INTRA_ARTICULAR

## 2019-05-30 NOTE — Progress Notes (Signed)
   Procedure Note  Patient: Melissa [name]             Date of Birth: 06/14/1951           MRN: [id number]             Visit Date: 05/30/2019  Procedures: Visit Diagnoses: No diagnosis found.  Large Joint Inj: R knee on 05/30/2019 8:58 AM Indications: diagnostic evaluation and pain Details: 22 G 1.5 in needle, superolateral approach  Arthrogram: No  Medications: 30 mg Cross-Linked Hyaluronate 30 MG/3ML Outcome: tolerated well, no immediate complications Procedure, treatment alternatives, risks and benefits explained, specific risks discussed. Consent was given by the patient. Immediately prior to procedure a time out was called to verify the correct patient, procedure, equipment, support staff and site/side marked as required. Patient was prepped and draped in the usual sterile fashion.   Large Joint Inj: L knee on 05/30/2019 8:58 AM Indications: diagnostic evaluation and pain Details: 22 G 1.5 in needle, superolateral approach  Arthrogram: No  Medications: 30 mg Cross-Linked Hyaluronate 30 MG/3ML Outcome: tolerated well, no immediate complications Procedure, treatment alternatives, risks and benefits explained, specific risks discussed. Consent was given by the patient. Immediately prior to procedure a time out was called to verify the correct patient, procedure, equipment, support staff and site/side marked as required. Patient was prepped and draped in the usual sterile fashion.    The patient is here today for bilateral knee injections with hyaluronic acid using gel 1.  This is to treat the pain from osteoarthritis.  She is already tried and failed conservative treatment for multiple months now on both knees including quad strengthening exercises, anti-inflammatories, activity modification, weight loss and steroid injections.  There is treatment modalities have now failed so this is the next on hopefully getting her knees to feel better.  She has had no other change in her medical  status.  She understands the risk and benefits of injections such as this and all question concerns were answered and addressed.  Her knee exam shows arthritic changes in both knees with good range of motion and no effusion.  Both knees have medial joint tenderness and patellofemoral crepitation.  Daughter injections well.  Follow-up at this point will be as needed.

## 2019-06-02 DIAGNOSIS — E039 Hypothyroidism, unspecified: Secondary | ICD-10-CM | POA: Diagnosis not present

## 2019-06-02 DIAGNOSIS — R7303 Prediabetes: Secondary | ICD-10-CM | POA: Diagnosis not present

## 2019-06-02 DIAGNOSIS — E559 Vitamin D deficiency, unspecified: Secondary | ICD-10-CM | POA: Diagnosis not present

## 2019-06-02 DIAGNOSIS — I10 Essential (primary) hypertension: Secondary | ICD-10-CM | POA: Diagnosis not present

## 2019-06-02 DIAGNOSIS — E785 Hyperlipidemia, unspecified: Secondary | ICD-10-CM | POA: Diagnosis not present

## 2019-06-07 ENCOUNTER — Ambulatory Visit (INDEPENDENT_AMBULATORY_CARE_PROVIDER_SITE_OTHER): Payer: Medicare HMO | Admitting: Orthopaedic Surgery

## 2019-06-07 ENCOUNTER — Other Ambulatory Visit: Payer: Self-pay

## 2019-06-07 DIAGNOSIS — M1711 Unilateral primary osteoarthritis, right knee: Secondary | ICD-10-CM

## 2019-06-07 DIAGNOSIS — M170 Bilateral primary osteoarthritis of knee: Secondary | ICD-10-CM | POA: Diagnosis not present

## 2019-06-07 DIAGNOSIS — E039 Hypothyroidism, unspecified: Secondary | ICD-10-CM | POA: Diagnosis not present

## 2019-06-07 DIAGNOSIS — M1712 Unilateral primary osteoarthritis, left knee: Secondary | ICD-10-CM

## 2019-06-07 DIAGNOSIS — E785 Hyperlipidemia, unspecified: Secondary | ICD-10-CM | POA: Diagnosis not present

## 2019-06-07 DIAGNOSIS — R7303 Prediabetes: Secondary | ICD-10-CM | POA: Diagnosis not present

## 2019-06-07 DIAGNOSIS — M8580 Other specified disorders of bone density and structure, unspecified site: Secondary | ICD-10-CM | POA: Diagnosis not present

## 2019-06-07 DIAGNOSIS — I10 Essential (primary) hypertension: Secondary | ICD-10-CM | POA: Diagnosis not present

## 2019-06-07 DIAGNOSIS — E559 Vitamin D deficiency, unspecified: Secondary | ICD-10-CM | POA: Diagnosis not present

## 2019-06-07 NOTE — Progress Notes (Signed)
The patient comes in today out of concern of her right knee.  She does have a severe osteoarthritis in both knees.  We have tried steroid injections and just a week ago placed hyaluronic acid with Synvisc 1 in both knees.  She was concerned that she was having some popping in her knees and the feeling like the knee is going to give out.  She also feels that there is tightness in the back of the knee.  I gave her reassurance that these things can be normal given the amount of arthritis she has in her knee and the large amount of fluid from Synvisc 1 was placed in her knee just a week ago.  On exam there is no glaring deformities of her knees in terms of redness or significant swelling but there is some effusion on both knees.  Her right knee is more painful and has valgus malalignment.  I gave her reassurance that the the arthritic process in her knee is what is causing all of her symptoms.  Certainly the fullness is likely from the hyaluronic acid.  The popping comes from arthritic changes as well.  She will work on Forensic scientist exercises.  We will see her back in 6 weeks to see if this is helped her at all.  The other option to consider from a surgical standpoint would be a knee replacement.

## 2019-06-27 DIAGNOSIS — Z7189 Other specified counseling: Secondary | ICD-10-CM | POA: Diagnosis not present

## 2019-06-27 DIAGNOSIS — Z20828 Contact with and (suspected) exposure to other viral communicable diseases: Secondary | ICD-10-CM | POA: Diagnosis not present

## 2019-06-28 ENCOUNTER — Ambulatory Visit
Admission: RE | Admit: 2019-06-28 | Discharge: 2019-06-28 | Disposition: A | Discharge status: Home / Self Care | Payer: Medicare HMO | Source: Ambulatory Visit | Attending: Family Medicine | Admitting: Family Medicine

## 2019-06-28 ENCOUNTER — Other Ambulatory Visit: Payer: Self-pay

## 2019-06-28 DIAGNOSIS — M8580 Other specified disorders of bone density and structure, unspecified site: Secondary | ICD-10-CM

## 2019-06-28 DIAGNOSIS — Z1231 Encounter for screening mammogram for malignant neoplasm of breast: Secondary | ICD-10-CM | POA: Diagnosis not present

## 2019-07-26 ENCOUNTER — Ambulatory Visit (INDEPENDENT_AMBULATORY_CARE_PROVIDER_SITE_OTHER): Payer: Medicare HMO | Admitting: Orthopaedic Surgery

## 2019-07-26 ENCOUNTER — Encounter: Payer: Self-pay | Admitting: Orthopaedic Surgery

## 2019-07-26 DIAGNOSIS — G8929 Other chronic pain: Secondary | ICD-10-CM

## 2019-07-26 DIAGNOSIS — M1712 Unilateral primary osteoarthritis, left knee: Secondary | ICD-10-CM

## 2019-07-26 DIAGNOSIS — M1711 Unilateral primary osteoarthritis, right knee: Secondary | ICD-10-CM

## 2019-07-26 DIAGNOSIS — M25561 Pain in right knee: Secondary | ICD-10-CM | POA: Diagnosis not present

## 2019-07-26 DIAGNOSIS — M25562 Pain in left knee: Secondary | ICD-10-CM | POA: Diagnosis not present

## 2019-07-26 MED ORDER — LIDOCAINE HCL 1 % IJ SOLN
3.0000 mL | INTRAMUSCULAR | Status: AC | PRN
  Administered 2019-07-26: 3 mL

## 2019-07-26 MED ORDER — METHYLPREDNISOLONE ACETATE 40 MG/ML IJ SUSP
40.0000 mg | INTRAMUSCULAR | Status: AC | PRN
  Administered 2019-07-26: 40 mg via INTRA_ARTICULAR

## 2019-07-26 NOTE — Progress Notes (Signed)
Office Visit Note   Patient: Melissa [name]           Date of Birth: 02-Feb-1951           MRN: [id number] Visit Date: 07/26/2019              Requested by: [facility] [address],  [place] [address] PCP: [facility]   Assessment & Plan: Visit Diagnoses:  1. Unilateral primary osteoarthritis, right knee   2. Unilateral primary osteoarthritis, left knee   3. Chronic pain of left knee   4. Chronic pain of right knee     Plan: Per her wishes I did provide a steroid injection in both knees today.  All question concerns were answered and addressed.  Follow-up will be as needed.  If she is at the point where she needs another steroid injection she knows to wait at least 3 to 4 months.  We would not provide hyaluronic acid again since she does not feel like this did not help.  Hopefully this still has time to kick in but if it does not help we will not provide that type of injection again.  The only other treatment option would be to consider knee replacement surgery.  Follow-Up Instructions: Return in about 4 weeks (around 08/23/2019).   Orders:  No orders of the defined types were placed in this encounter.  No orders of the defined types were placed in this encounter.     Procedures: Large Joint Inj: R knee on 07/26/2019 4:25 PM Indications: diagnostic evaluation and pain Details: 22 G 1.5 in needle, superolateral approach  Arthrogram: No  Medications: 3 mL lidocaine 1 %; 40 mg methylPREDNISolone acetate 40 MG/ML Outcome: tolerated well, no immediate complications Procedure, treatment alternatives, risks and benefits explained, specific risks discussed. Consent was given by the patient. Immediately prior to procedure a time out was called to verify the correct patient, procedure, equipment, support staff and site/side marked as required. Patient was prepped and draped in the usual sterile fashion.    Large Joint Inj: L knee on 07/26/2019 4:25 PM Indications: diagnostic evaluation and pain Details: 22 G 1.5 in needle, superolateral approach  Arthrogram: No  Medications: 3 mL lidocaine 1 %; 40 mg methylPREDNISolone acetate 40 MG/ML Outcome: tolerated well, no immediate complications Procedure, treatment alternatives, risks and benefits explained, specific risks discussed. Consent was given by the patient. Immediately prior to procedure a time out was called to verify the correct patient, procedure, equipment, support staff and site/side marked as required. Patient was prepped and draped in the usual sterile fashion.       Clinical Data: No additional findings.   Subjective: Chief Complaint  Patient presents with  . Left Knee - Follow-up  . Right Knee - Follow-up  The patient is well-known to me.  She has known osteoarthritis in both her knees.  She has had steroid injections in the past and most recently she had hyaluronic acid in both knees.  This was done with Synvisc 1 on July 7 of this year.  She does not feel that there was a measurable change at all.  She is working with a Physiological scientist trying to get stronger.  She is working on activity modification and weight loss.  She states that she would like to try steroid injections one more time in both knees today.  She is working with in the school system and is not ready  for any type of knee replacement yet she states.  Her pain is daily and it is detriment affecting her mobility, her quality of life, and her actives of daily living.  HPI  Review of Systems She currently denies any headache, chest pain, shortness of breath, fever, chills, nausea, vomiting  Objective: Vital Signs: There were no vitals taken for this visit.  Physical Exam She is alert and orient x3 and in no acute distress Ortho Exam Examination of both knees show global tenderness with good range of motion.  Both knees have slight varus malalignment.  There is  patellofemoral crepitation with both knees with good range of motion. Specialty Comments:  No specialty comments available.  Imaging: No results found.   PMFS History: Patient Active Problem List   Diagnosis Date Noted  . Pain in right foot 07/05/2017  . Chronic pain of right knee 07/05/2017  . Chronic pain of left knee 07/05/2017  . Unilateral primary osteoarthritis, left knee 07/05/2017  . Unilateral primary osteoarthritis, right knee 07/05/2017  . 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, right 04/14/2017   History reviewed. No pertinent past medical history.  Family History  Problem Relation Age of Onset  . Breast cancer Maternal Aunt        in 41's    History reviewed. No pertinent surgical history. Social History   Occupational History  . Not on file  Tobacco Use  . Smoking status: Never Smoker  . Smokeless tobacco: Never Used  Substance and Sexual Activity  . Alcohol use: Not on file  . Drug use: Not on file  . Sexual activity: Not on file

## 2019-08-14 ENCOUNTER — Other Ambulatory Visit: Payer: Medicare HMO

## 2019-08-17 DIAGNOSIS — Z23 Encounter for immunization: Secondary | ICD-10-CM | POA: Diagnosis not present

## 2019-09-08 DIAGNOSIS — R69 Illness, unspecified: Secondary | ICD-10-CM | POA: Diagnosis not present

## 2019-09-08 DIAGNOSIS — R239 Unspecified skin changes: Secondary | ICD-10-CM | POA: Diagnosis not present

## 2019-10-10 DIAGNOSIS — Z03818 Encounter for observation for suspected exposure to other biological agents ruled out: Secondary | ICD-10-CM | POA: Diagnosis not present

## 2019-10-10 DIAGNOSIS — Z7189 Other specified counseling: Secondary | ICD-10-CM | POA: Diagnosis not present

## 2019-10-10 DIAGNOSIS — Z20828 Contact with and (suspected) exposure to other viral communicable diseases: Secondary | ICD-10-CM | POA: Diagnosis not present

## 2019-10-23 ENCOUNTER — Ambulatory Visit
Admission: RE | Admit: 2019-10-23 | Discharge: 2019-10-23 | Disposition: A | Discharge status: Home / Self Care | Payer: Medicare HMO | Source: Ambulatory Visit | Attending: Family Medicine | Admitting: Family Medicine

## 2019-10-23 ENCOUNTER — Other Ambulatory Visit: Payer: Self-pay

## 2019-10-23 DIAGNOSIS — Z780 Asymptomatic menopausal state: Secondary | ICD-10-CM | POA: Diagnosis not present

## 2019-10-23 DIAGNOSIS — M85852 Other specified disorders of bone density and structure, left thigh: Secondary | ICD-10-CM | POA: Diagnosis not present

## 2019-10-30 ENCOUNTER — Ambulatory Visit: Payer: Medicare HMO | Admitting: Orthopaedic Surgery

## 2019-10-30 ENCOUNTER — Other Ambulatory Visit: Payer: Self-pay

## 2019-10-30 DIAGNOSIS — M25562 Pain in left knee: Secondary | ICD-10-CM | POA: Diagnosis not present

## 2019-10-30 DIAGNOSIS — G8929 Other chronic pain: Secondary | ICD-10-CM

## 2019-10-30 DIAGNOSIS — M1711 Unilateral primary osteoarthritis, right knee: Secondary | ICD-10-CM

## 2019-10-30 DIAGNOSIS — M1712 Unilateral primary osteoarthritis, left knee: Secondary | ICD-10-CM

## 2019-10-30 DIAGNOSIS — M25561 Pain in right knee: Secondary | ICD-10-CM

## 2019-10-30 MED ORDER — METHYLPREDNISOLONE ACETATE 40 MG/ML IJ SUSP
40.0000 mg | INTRAMUSCULAR | Status: AC | PRN
  Administered 2019-10-30: 09:00:00 40 mg via INTRA_ARTICULAR

## 2019-10-30 MED ORDER — METHYLPREDNISOLONE ACETATE 40 MG/ML IJ SUSP
40.0000 mg | INTRAMUSCULAR | Status: AC | PRN
  Administered 2019-10-30: 40 mg via INTRA_ARTICULAR

## 2019-10-30 MED ORDER — LIDOCAINE HCL 1 % IJ SOLN
3.0000 mL | INTRAMUSCULAR | Status: AC | PRN
  Administered 2019-10-30: 3 mL

## 2019-10-30 NOTE — Progress Notes (Signed)
Office Visit Note   Patient: Melissa [name]           Date of Birth: [birth year]           MRN: [id number] Visit Date: 10/30/2019              Requested by: [facility] [address] [place],  [place] [address] PCP: [facility]   Assessment & Plan: Visit Diagnoses:  1. Unilateral primary osteoarthritis, right knee   2. Unilateral primary osteoarthritis, left knee   3. Chronic pain of left knee   4. Chronic pain of right knee     Plan: Per her request I did provide steroid injections in both knees today.  Having had them before she is fully aware of the risk and benefits of steroid injections.  She tolerated them well.  All question concerns were answered and addressed.  We will see her back in 3 months.  We can discuss knee replacement surgery then.  I would like a standing AP and lateral of both knees at that visit.  Follow-Up Instructions: Return in about 3 months (around 01/28/2020).   Orders:  Orders Placed This Encounter  Procedures  . Large Joint Inj  . Large Joint Inj   No orders of the defined types were placed in this encounter.     Procedures: Large Joint Inj: R knee on 10/30/2019 8:42 AM Indications: diagnostic evaluation and pain Details: 22 G 1.5 in needle, superolateral approach  Arthrogram: No  Medications: 3 mL lidocaine 1 %; 40 mg methylPREDNISolone acetate 40 MG/ML Outcome: tolerated well, no immediate complications Procedure, treatment alternatives, risks and benefits explained, specific risks discussed. Consent was given by the patient. Immediately prior to procedure a time out was called to verify the correct patient, procedure, equipment, support staff and site/side marked as required. Patient was prepped and draped in the usual sterile fashion.   Large Joint Inj: L knee on 10/30/2019 8:43 AM Indications: diagnostic evaluation and pain Details: 22 G 1.5 in needle, superolateral  approach  Arthrogram: No  Medications: 3 mL lidocaine 1 %; 40 mg methylPREDNISolone acetate 40 MG/ML Outcome: tolerated well, no immediate complications Procedure, treatment alternatives, risks and benefits explained, specific risks discussed. Consent was given by the patient. Immediately prior to procedure a time out was called to verify the correct patient, procedure, equipment, support staff and site/side marked as required. Patient was prepped and draped in the usual sterile fashion.       Clinical Data: No additional findings.   Subjective: Chief Complaint  Patient presents with  . Left Knee - Pain  . Right Knee - Pain  The patient is well-known to me.  She has chronic bilateral knee pain with known osteoarthritis of both knees.  She has had an acute flareup of her pain and would like to have a steroid injection in both knees today.  She would like to consider knee replacement surgery potentially in the spring of next year.  She has had no other acute changes in her medical status.  Her pain is daily and it is detrimentally affecting her mobility, her quality of life, and her actives daily living.  HPI  Review of Systems She currently denies any headache, chest pain, shortness of breath, fever, chills, nausea, vomiting  Objective: Vital Signs: There were no vitals taken for this visit.  Physical Exam She is alert and orient x3 and in no acute distress Ortho Exam  examination of both knees shows medial and lateral joint line tenderness with patellofemoral crepitation.  Both knees have excellent range of motion and are ligamentously stable Specialty Comments:  No specialty comments available.  Imaging: No results found.   PMFS History: Patient Active Problem List   Diagnosis Date Noted  . Pain in right foot 07/05/2017  . Chronic pain of right knee 07/05/2017  . Chronic pain of left knee 07/05/2017  . Unilateral primary osteoarthritis, left knee 07/05/2017  .  Unilateral primary osteoarthritis, right knee 07/05/2017  . 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  Family History  Problem Relation Age of Onset  . Breast cancer Maternal Aunt        in 80's    No past surgical history on file. Social History   Occupational History  . Not on file  Tobacco Use  . Smoking status: Never Smoker  . Smokeless tobacco: Never Used  Substance and Sexual Activity  . Alcohol use: Not on file  . Drug use: Not on file  . Sexual activity: Not on file

## 2020-01-03 DIAGNOSIS — Z7189 Other specified counseling: Secondary | ICD-10-CM | POA: Diagnosis not present

## 2020-01-03 DIAGNOSIS — Z20828 Contact with and (suspected) exposure to other viral communicable diseases: Secondary | ICD-10-CM | POA: Diagnosis not present

## 2020-01-13 ENCOUNTER — Ambulatory Visit: Payer: Medicare HMO | Attending: Internal Medicine

## 2020-01-13 DIAGNOSIS — Z23 Encounter for immunization: Secondary | ICD-10-CM | POA: Insufficient documentation

## 2020-01-13 NOTE — Progress Notes (Signed)
   Covid-19 Vaccination Clinic  Name:  Melissa [name]    MRN: [id number] DOB: 12/05/1950  01/13/2020  [name] was observed post Covid-19 immunization for 15 minutes without incidence. She was provided with Vaccine Information Sheet and instruction to access the V-Safe system.   [name] was instructed to call 911 with any severe reactions post vaccine: Marland Kitchen Difficulty breathing  . Swelling of your face and throat  . A fast heartbeat  . A bad rash all over your body  . Dizziness and weakness    Immunizations Administered    Name Date Dose VIS Date Route   Pfizer COVID-19 Vaccine 01/13/2020 12:05 PM 0.3 mL 11/03/2019 Intramuscular   Manufacturer: [place]   Lot: [id number]   [place]: [id number]

## 2020-01-18 DIAGNOSIS — M170 Bilateral primary osteoarthritis of knee: Secondary | ICD-10-CM | POA: Diagnosis not present

## 2020-01-18 DIAGNOSIS — E785 Hyperlipidemia, unspecified: Secondary | ICD-10-CM | POA: Diagnosis not present

## 2020-01-18 DIAGNOSIS — I10 Essential (primary) hypertension: Secondary | ICD-10-CM | POA: Diagnosis not present

## 2020-01-18 DIAGNOSIS — E039 Hypothyroidism, unspecified: Secondary | ICD-10-CM | POA: Diagnosis not present

## 2020-01-29 ENCOUNTER — Other Ambulatory Visit: Payer: Self-pay

## 2020-01-29 ENCOUNTER — Encounter: Payer: Self-pay | Admitting: Orthopaedic Surgery

## 2020-01-29 ENCOUNTER — Ambulatory Visit: Payer: Medicare HMO | Admitting: Orthopaedic Surgery

## 2020-01-29 ENCOUNTER — Ambulatory Visit (INDEPENDENT_AMBULATORY_CARE_PROVIDER_SITE_OTHER): Payer: Medicare HMO

## 2020-01-29 ENCOUNTER — Ambulatory Visit: Payer: Self-pay

## 2020-01-29 VITALS — Ht 64.0 in | Wt 180.0 lb

## 2020-01-29 DIAGNOSIS — M1711 Unilateral primary osteoarthritis, right knee: Secondary | ICD-10-CM

## 2020-01-29 DIAGNOSIS — M1712 Unilateral primary osteoarthritis, left knee: Secondary | ICD-10-CM | POA: Diagnosis not present

## 2020-01-29 NOTE — Progress Notes (Signed)
Office Visit Note   Patient: Melissa [name]           Date of Birth: 15-Jul-1951           MRN: [id number] Visit Date: 01/29/2020              Requested by: [facility] [address] [place],  [place] [address] PCP: [facility]   Assessment & Plan: Visit Diagnoses:  1. Unilateral primary osteoarthritis, right knee   2. Unilateral primary osteoarthritis, left knee     Plan: At this point given the failure of conservative treatment for multiple years now and given the evidence of low back and osteoarthritis in her knees, we are recommending knee replacement surgery.  We discussed what the surgery involves up in the risk and benefits of surgery.  We talked about her interoperative and postoperative course in detail.  She would like to proceed with the right knee first.  We will work on getting this scheduled in the near future.  All questions and concerns were answered and addressed.  Follow-Up Instructions: Return for 2 weeks post-op.   Orders:  Orders Placed This Encounter  Procedures  . XR Knee 1-2 Views Left  . XR Knee 1-2 Views Right   No orders of the defined types were placed in this encounter.     Procedures: No procedures performed   Clinical Data: No additional findings.   Subjective: Chief Complaint  Patient presents with  . Left Knee - Follow-up  . Right Knee - Follow-up  The patient is well-known to [place].  She has severe end-stage arthritis of both knees.  This is been well-documented.  She has been dealing with this for over 5 years now.  We have provided steroid injections and hyaluronic acid injections in both knees numerous times.  She has had arthroscopic intervention of the right knee.  At this point her bilateral knee pain is daily and it is detrimentally affected her mobility, her quality of life, and her activities of daily living.  It has worsened over the last several years.  She is  tried failed all forms of conservative treatment even including physical therapy.  At this point she wished to proceed with total knee arthroplasty starting with the right side.  We have discussed this numerous times in the office in the past.  She feels now that it is time given her pain being 10 out of 10 and how this is had a negative impact now in her life.  HPI  Review of Systems She currently denies any headache, chest pain, shortness of breath, fever, chills, nausea, vomiting.  There are no active medical issues.  Objective: Vital Signs: Ht 5\' 4"  (1.626 m)   Wt 180 lb (81.6 kg)   BMI 30.90 kg/m   Physical Exam She is alert and orient x3 and in no acute distress Ortho Exam Examination of both knees shows severe patellofemoral crepitation.  The left knee has slight varus malalignment and the right knee has slight valgus malalignment.  Both knees feel stable ligamentously but have severe pain with palpation of both medial lateral compartments and significant pain on palpation and range of motion. Specialty Comments:  No specialty comments available.  Imaging: XR Knee 1-2 Views Left  Result Date: 01/29/2020 An AP and lateral of the left knee show severe end-stage arthritis of the left knee.  There is complete loss of medial joint space in the patellofemoral  joint.  There is large periarticular osteophytes in all 3 compartments.  XR Knee 1-2 Views Right  Result Date: 01/29/2020 2 views of the right knee shows severe end-stage arthritis.  There is valgus malalignment the knee.  There are large peritracheal osteophytes throughout the knee and complete loss of the lateral joint space.    PMFS History: Patient Active Problem List   Diagnosis Date Noted  . Pain in right foot 07/05/2017  . Chronic pain of right knee 07/05/2017  . Chronic pain of left knee 07/05/2017  . Unilateral primary osteoarthritis, left knee 07/05/2017  . Unilateral primary osteoarthritis, right knee 07/05/2017    . 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, right 04/14/2017   History reviewed. No pertinent past medical history.  Family History  Problem Relation Age of Onset  . Breast cancer Maternal Aunt        in 75's    History reviewed. No pertinent surgical history. Social History   Occupational History  . Not on file  Tobacco Use  . Smoking status: Never Smoker  . Smokeless tobacco: Never Used  Substance and Sexual Activity  . Alcohol use: Not on file  . Drug use: Not on file  . Sexual activity: Not on file

## 2020-02-05 ENCOUNTER — Ambulatory Visit: Payer: Medicare HMO | Attending: Internal Medicine

## 2020-02-05 ENCOUNTER — Other Ambulatory Visit: Payer: Self-pay

## 2020-02-05 DIAGNOSIS — Z23 Encounter for immunization: Secondary | ICD-10-CM

## 2020-02-05 NOTE — Progress Notes (Signed)
   Covid-19 Vaccination Clinic  Name:  Melissa [name]    MRN: [id number] DOB: 1951/04/27  02/05/2020  Ms. Melissa [name] was observed post Covid-19 immunization for 15 minutes without incident. She was provided with Vaccine Information Sheet and instruction to access the V-Safe system.   Ms. Melissa [name] was instructed to call 911 with any severe reactions post vaccine: Marland Kitchen Difficulty breathing  . Swelling of face and throat  . A fast heartbeat  . A bad rash all over body  . Dizziness and weakness   Immunizations Administered    Name Date Dose VIS Date Route   Pfizer COVID-19 Vaccine 02/05/2020  9:19 AM 0.3 mL 11/03/2019 Intramuscular   Manufacturer: [place]   Lot: [id number]   [place]: [id number]

## 2020-02-26 DIAGNOSIS — M170 Bilateral primary osteoarthritis of knee: Secondary | ICD-10-CM | POA: Diagnosis not present

## 2020-02-26 DIAGNOSIS — I10 Essential (primary) hypertension: Secondary | ICD-10-CM | POA: Diagnosis not present

## 2020-02-26 DIAGNOSIS — R7303 Prediabetes: Secondary | ICD-10-CM | POA: Diagnosis not present

## 2020-02-26 DIAGNOSIS — E039 Hypothyroidism, unspecified: Secondary | ICD-10-CM | POA: Diagnosis not present

## 2020-02-26 DIAGNOSIS — E785 Hyperlipidemia, unspecified: Secondary | ICD-10-CM | POA: Diagnosis not present

## 2020-02-27 DIAGNOSIS — Z03818 Encounter for observation for suspected exposure to other biological agents ruled out: Secondary | ICD-10-CM | POA: Diagnosis not present

## 2020-02-29 ENCOUNTER — Other Ambulatory Visit: Payer: Self-pay | Admitting: Physician Assistant

## 2020-03-01 NOTE — Patient Instructions (Addendum)
DUE TO COVID-19 ONLY ONE VISITOR IS ALLOWED TO COME WITH YOU AND STAY IN THE WAITING ROOM ONLY DURING PRE OP AND PROCEDURE DAY OF SURGERY. TWO  VISITOR MAY VISIT WITH YOU AFTER SURGERY IN YOUR PRIVATE ROOM DURING VISITING HOURS ONLY!    10a-8p  YOU NEED TO HAVE A COVID 19 TEST ON__4-13-21_____ @_1230  pm______, THIS TEST MUST BE DONE BEFORE SURGERY, COME  [address], [place] , [address].  ([place]) ONCE YOUR COVID TEST IS COMPLETED, PLEASE BEGIN THE QUARANTINE INSTRUCTIONS AS OUTLINED IN YOUR HANDOUT.                Melissa [name]  03/01/2020   Your procedure is scheduled on: 03-08-20   Report to [facility] Main  Entrance   Report to admitting at      [place] [facility]  AM     Call this number if you have problems the morning of surgery [phone]    Remember: NO SOLID FOOD AFTER MIDNIGHT THE NIGHT PRIOR TO SURGERY. NOTHING BY MOUTH EXCEPT CLEAR LIQUIDS UNTIL   0530 . PLEASE FINISH   G2    DRINK PER SURGEON ORDER  WHICH NEEDS TO BE COMPLETED AT    0530 am then nothing by mouth.    CLEAR LIQUID DIET   Foods Allowed                                                                                               Foods Excluded  Coffee and tea, regular and decaf no creamer                                                               liquids that you cannot  Plain Jell-O any favor except red or purple                                            see through such as: Fruit ices (not with fruit pulp)                                                                        milk, soups, orange juice  Iced Popsicles  All solid food Carbonated beverages, regular and diet                                    Cranberry, grape and apple juices Sports drinks like Gatorade Lightly seasoned clear broth or consume(fat free) Sugar, honey  syrup   _____________________________________________________________________     BRUSH YOUR TEETH MORNING OF SURGERY AND RINSE YOUR MOUTH OUT, NO CHEWING GUM CANDY OR MINTS.     Take these medicines the morning of surgery with A SIP OF WATER: levothyroxine  DO NOT TAKE ANY DIABETIC MEDICATIONS DAY OF YOUR SURGERY                               You may not have any metal on your body including hair pins and              piercings  Do not wear jewelry, make-up, lotions, powders or perfumes, deodorant             Do not wear nail polish on your fingernails.  Do not shave  48 hours prior to surgery.     Do not bring valuables to the hospital. [place].  Contacts, dentures or bridgework may not be worn into surgery.     _____________________________________________________________________           [facility] [place] - Preparing for Surgery Before surgery, you can play an important role.  Because skin is not sterile, your skin needs to be as free of germs as possible.  You can reduce the number of germs on your skin by washing with CHG (chlorahexidine gluconate) soap before surgery.  CHG is an antiseptic cleaner which kills germs and bonds with the skin to continue killing germs even after washing. Please DO NOT use if you have an allergy to CHG or antibacterial soaps.  If your skin becomes reddened/irritated stop using the CHG and inform your nurse when you arrive at Short Stay. Do not shave (including legs and underarms) for at least 48 hours prior to the first CHG shower.  You may shave your face/neck. Please follow these instructions carefully:  1.  Shower with CHG Soap the night before surgery and the  morning of Surgery.  2.  If you choose to wash your hair, wash your hair first as usual with your  normal  shampoo.  3.  After you shampoo, rinse your hair and body thoroughly to remove the  shampoo.                           4.  Use CHG as  you would any other liquid soap.  You can apply chg directly  to the skin and wash                       Gently with a scrungie or clean washcloth.  5.  Apply the CHG Soap to your body ONLY FROM THE NECK DOWN.   Do not use on face/ open                           Wound or open sores. Avoid contact with eyes, ears mouth  and genitals (private parts).                       Wash face,  Genitals (private parts) with your normal soap.             6.  Wash thoroughly, paying special attention to the area where your surgery  will be performed.  7.  Thoroughly rinse your body with warm water from the neck down.  8.  DO NOT shower/wash with your normal soap after using and rinsing off  the CHG Soap.                9.  Pat yourself dry with a clean towel.            10.  Wear clean pajamas.            11.  Place clean sheets on your bed the night of your first shower and do not  sleep with pets. Day of Surgery : Do not apply any lotions/deodorants the morning of surgery.  Please wear clean clothes to the hospital/surgery center.  FAILURE TO FOLLOW THESE INSTRUCTIONS MAY RESULT IN THE CANCELLATION OF YOUR SURGERY PATIENT SIGNATURE_________________________________  NURSE SIGNATURE__________________________________  ________________________________________________________________________   Melissa [name]  An incentive spirometer is a tool that can help keep your lungs clear and active. This tool measures how well you are filling your lungs with each breath. Taking long deep breaths may help reverse or decrease the chance of developing breathing (pulmonary) problems (especially infection) following:  A long period of time when you are unable to move or be active. BEFORE THE PROCEDURE   If the spirometer includes an indicator to show your best effort, your nurse or respiratory therapist will set it to a desired goal.  If possible, sit up straight or lean slightly forward. Try not to  slouch.  Hold the incentive spirometer in an upright position. INSTRUCTIONS FOR USE  1. Sit on the edge of your bed if possible, or sit up as far as you can in bed or on a chair. 2. Hold the incentive spirometer in an upright position. 3. Breathe out normally. 4. Place the mouthpiece in your mouth and seal your lips tightly around it. 5. Breathe in slowly and as deeply as possible, raising the piston or the ball toward the top of the column. 6. Hold your breath for 3-5 seconds or for as long as possible. Allow the piston or ball to fall to the bottom of the column. 7. Remove the mouthpiece from your mouth and breathe out normally. 8. Rest for a few seconds and repeat Steps 1 through 7 at least 10 times every 1-2 hours when you are awake. Take your time and take a few normal breaths between deep breaths. 9. The spirometer may include an indicator to show your best effort. Use the indicator as a goal to work toward during each repetition. 10. After each set of 10 deep breaths, practice coughing to be sure your lungs are clear. If you have an incision (the cut made at the time of surgery), support your incision when coughing by placing a pillow or rolled up towels firmly against it. Once you are able to get out of bed, walk around indoors and cough well. You may stop using the incentive spirometer when instructed by your caregiver.  RISKS AND COMPLICATIONS  Take your time so you do not get dizzy or light-headed.  If you are in pain, you  may need to take or ask for pain medication before doing incentive spirometry. It is harder to take a deep breath if you are having pain. AFTER USE  Rest and breathe slowly and easily.  It can be helpful to keep track of a log of your progress. Your caregiver can provide you with a simple table to help with this. If you are using the spirometer at home, follow these instructions: [place] IF:   You are having difficultly using the spirometer.  You  have trouble using the spirometer as often as instructed.  Your pain medication is not giving enough relief while using the spirometer.  You develop fever of 100.5 F (38.1 C) or higher. SEEK IMMEDIATE MEDICAL CARE IF:   You cough up bloody sputum that had not been present before.  You develop fever of 102 F (38.9 C) or greater.  You develop worsening pain at or near the incision site. MAKE SURE YOU:   Understand these instructions.  Will watch your condition.  Will get help right away if you are not doing well or get worse. Document Released: 03/22/2007 Document Revised: 02/01/2012 Document Reviewed: 05/23/2007 [facility] Patient Information 2014 [place], [place].   ________________________________________________________________________

## 2020-03-01 NOTE — Progress Notes (Signed)
PCP - [facility] Cardiologist -   Chest x-ray -  EKG - 03-04-20 Stress Test -  ECHO -  Cardiac Cath -   Sleep Study -  CPAP -   Fasting Blood Sugar -  Checks Blood Sugar _____ times a day  Blood Thinner Instructions: Aspirin Instructions: Last Dose:  Anesthesia review:  Abn ekg  Patient denies shortness of breath, fever, cough and chest pain at PAT appointment   none  Patient verbalized understanding of instructions that were given to them at the PAT appointment. Patient was also instructed that they will need to review over the PAT instructions again at home before surgery.

## 2020-03-04 ENCOUNTER — Other Ambulatory Visit: Payer: Self-pay

## 2020-03-04 ENCOUNTER — Encounter (HOSPITAL_COMMUNITY)
Admission: RE | Admit: 2020-03-04 | Discharge: 2020-03-04 | Disposition: A | Discharge status: Home / Self Care | Payer: Medicare HMO | Source: Ambulatory Visit | Attending: Orthopaedic Surgery | Admitting: Orthopaedic Surgery

## 2020-03-04 ENCOUNTER — Encounter (HOSPITAL_COMMUNITY): Payer: Self-pay

## 2020-03-04 DIAGNOSIS — Z01812 Encounter for preprocedural laboratory examination: Secondary | ICD-10-CM | POA: Diagnosis not present

## 2020-03-04 DIAGNOSIS — R9431 Abnormal electrocardiogram [ECG] [EKG]: Secondary | ICD-10-CM | POA: Diagnosis not present

## 2020-03-04 DIAGNOSIS — Z01810 Encounter for preprocedural cardiovascular examination: Secondary | ICD-10-CM | POA: Insufficient documentation

## 2020-03-04 HISTORY — DX: Hypothyroidism, unspecified: E03.9

## 2020-03-04 HISTORY — DX: Pneumonia, unspecified organism: J18.9

## 2020-03-04 HISTORY — DX: Unspecified osteoarthritis, unspecified site: M19.90

## 2020-03-04 HISTORY — DX: Essential (primary) hypertension: I10

## 2020-03-04 HISTORY — DX: Prediabetes: R73.03

## 2020-03-04 LAB — BASIC METABOLIC PANEL
Anion gap: 13 (ref 5–15)
BUN: 24 mg/dL — ABNORMAL HIGH (ref 8–23)
CO2: 26 mmol/L (ref 22–32)
Calcium: 9.4 mg/dL (ref 8.9–10.3)
Chloride: 101 mmol/L (ref 98–111)
Creatinine, Ser: 0.74 mg/dL (ref 0.44–1.00)
GFR calc Af Amer: >60 mL/min (ref >60)
GFR calc non Af Amer: >60 mL/min (ref >60)
Glucose, Bld: 103 mg/dL — ABNORMAL HIGH (ref 70–99)
Potassium: 3.8 mmol/L (ref 3.5–5.1)
Sodium: 140 mmol/L (ref 135–145)

## 2020-03-04 LAB — CBC
HCT: 39 % (ref 36.0–46.0)
Hemoglobin: 13 g/dL (ref 12.0–15.0)
MCH: 30.6 pg (ref 26.0–34.0)
MCHC: 33.3 g/dL (ref 30.0–36.0)
MCV: 91.8 fL (ref 80.0–100.0)
Platelets: 289 10*3/uL (ref 150–400)
RBC: 4.25 MIL/uL (ref 3.87–5.11)
RDW: 12.6 % (ref 11.5–15.5)
WBC: 7.6 10*3/uL (ref 4.0–10.5)
nRBC: 0 % (ref 0.0–0.2)

## 2020-03-04 LAB — GLUCOSE, CAPILLARY: Glucose-Capillary: 92 mg/dL (ref 70–99)

## 2020-03-04 LAB — SURGICAL PCR SCREEN
MRSA, PCR: NEGATIVE
Staphylococcus aureus: NEGATIVE

## 2020-03-05 ENCOUNTER — Other Ambulatory Visit (HOSPITAL_COMMUNITY)
Admission: RE | Admit: 2020-03-05 | Discharge: 2020-03-05 | Disposition: A | Discharge status: Home / Self Care | Payer: Medicare HMO | Source: Ambulatory Visit | Attending: Orthopaedic Surgery | Admitting: Orthopaedic Surgery

## 2020-03-05 DIAGNOSIS — Z20822 Contact with and (suspected) exposure to covid-19: Secondary | ICD-10-CM | POA: Insufficient documentation

## 2020-03-05 DIAGNOSIS — Z01812 Encounter for preprocedural laboratory examination: Secondary | ICD-10-CM | POA: Insufficient documentation

## 2020-03-05 LAB — SARS CORONAVIRUS 2 (TAT 6-24 HRS): SARS Coronavirus 2: NEGATIVE

## 2020-03-06 LAB — HEMOGLOBIN A1C
Hgb A1c MFr Bld: 5.7 % — ABNORMAL HIGH (ref 4.8–5.6)
Mean Plasma Glucose: 117 mg/dL

## 2020-03-07 ENCOUNTER — Encounter (HOSPITAL_COMMUNITY): Payer: Self-pay | Admitting: Orthopaedic Surgery

## 2020-03-07 NOTE — H&P (Signed)
TOTAL KNEE ADMISSION H&P  Patient is being admitted for right total knee arthroplasty.  Subjective:  Chief Complaint:right knee pain.  HPI: Melissa [name], 69 y.o. female, has a history of pain and functional disability in the right knee due to arthritis and has failed non-surgical conservative treatments for greater than 12 weeks to includeNSAID's and/or analgesics, corticosteriod injections, viscosupplementation injections, flexibility and strengthening excercises, use of assistive devices, weight reduction as appropriate and activity modification.  Onset of symptoms was gradual, starting 3 years ago with gradually worsening course since that time. The patient noted prior procedures on the knee to include  arthroscopy on the right knee(s).  Patient currently rates pain in the right knee(s) at 10 out of 10 with activity. Patient has night pain, worsening of pain with activity and weight bearing, pain that interferes with activities of daily living, pain with passive range of motion, crepitus and joint swelling.  Patient has evidence of subchondral sclerosis, periarticular osteophytes and joint space narrowing by imaging studies. There is no active infection.  Patient Active Problem List   Diagnosis Date Noted  . Pain in right foot 07/05/2017  . Chronic pain of right knee 07/05/2017  . Chronic pain of left knee 07/05/2017  . Unilateral primary osteoarthritis, left knee 07/05/2017  . Unilateral primary osteoarthritis, right knee 07/05/2017  . 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 Arthritis    osteo arthritis  . Hypertension   . Hypothyroidism   . Pre-diabetes    Takes metformin  . Walking pneumonia     Past Surgical History:  Procedure Laterality Date  . MENISCUS REPAIR     right knee  . MULTIPLE TOOTH EXTRACTIONS     with implants  . ovaries removed     2003  . TONSILLECTOMY      No current  facility-administered medications for this encounter.   Current Outpatient Medications  Medication Sig Dispense Refill Last Dose  . atorvastatin (LIPITOR) 40 MG tablet Take 40 mg by mouth daily.     . Biotin 1000 MCG tablet Take 1,000 mcg by mouth daily.     Marland Kitchen CALCIUM-VITAMIN D PO Take 1 tablet by mouth daily.     . Cholecalciferol (VITAMIN D) 125 MCG (5000 UT) CAPS Take 5,000 Units by mouth daily.     Marland Kitchen ECHINACEA HERB PO Take 400 mg by mouth daily.     . Homeopathic Products (LIVER SUPPORT SL) Take 1 tablet by mouth daily. Liver Refresh     . ibuprofen (ADVIL) 200 MG tablet Take 600 mg by mouth at bedtime.     Marland Kitchen levothyroxine (SYNTHROID, LEVOTHROID) 75 MCG tablet Take 75 mcg by mouth daily before breakfast.   2   . lisinopril-hydrochlorothiazide (PRINZIDE,ZESTORETIC) 20-25 MG tablet Take 1 tablet by mouth daily.  1   . magnesium oxide (MAG-OX) 400 MG tablet Take 400 mg by mouth daily.     . metFORMIN (GLUCOPHAGE-XR) 750 MG 24 hr tablet Take 1,500 mg by mouth every evening.  1   . Misc Natural Products (OSTEO BI-FLEX ADV TRIPLE ST PO) Take 2 tablets by mouth daily.     . Turmeric 500 MG CAPS Take 500 mg by mouth daily.     Marland Kitchen zinc gluconate 50 MG tablet Take 50 mg by mouth daily.      No Known Allergies  Social History   Tobacco Use  . Smoking status: Former Smoker  Years: 10.00  . Smokeless tobacco: Never Used  . Tobacco comment: quit when 69 years old  Substance Use Topics  . Alcohol use: Yes    Alcohol/week: 2.0 standard drinks    Types: 1 Glasses of wine, 1 Shots of liquor per week    Comment: daily    Family History  Problem Relation Age of Onset  . Breast cancer Maternal Aunt        in 80's     Review of Systems  Musculoskeletal: Positive for joint swelling.  All other systems reviewed and are negative.   Objective:  Physical Exam  Constitutional: She is oriented to person, place, and time. She appears well-developed and well-nourished.  HENT:  Head:  Normocephalic and atraumatic.  Eyes: Pupils are equal, round, and reactive to light. EOM are normal.  Cardiovascular: Normal rate and regular rhythm.  Respiratory: Effort normal.  GI: Soft.  Musculoskeletal:     Cervical back: Normal range of motion and neck supple.     Right knee: Effusion present. Decreased range of motion. Tenderness present over the medial joint line and lateral joint line. Abnormal alignment and abnormal meniscus.  Neurological: She is alert and oriented to person, place, and time.  Skin: Skin is warm and dry.  Psychiatric: She has a normal mood and affect.    Vital signs in last 24 hours:    Labs:   Estimated body mass index is 35.96 kg/m as calculated from the following:   Height as of 03/04/20: 5\' 4"  (1.626 m).   Weight as of 03/04/20: 95 kg.   Imaging Review Plain radiographs demonstrate severe degenerative joint disease of the right knee(s). The overall alignment ismild varus. The bone quality appears to be good for age and reported activity level.      Assessment/Plan:  End stage arthritis, right knee   The patient history, physical examination, clinical judgment of the provider and imaging studies are consistent with end stage degenerative joint disease of the right knee(s) and total knee arthroplasty is deemed medically necessary. The treatment options including medical management, injection therapy arthroscopy and arthroplasty were discussed at length. The risks and benefits of total knee arthroplasty were presented and reviewed. The risks due to aseptic loosening, infection, stiffness, patella tracking problems, thromboembolic complications and other imponderables were discussed. The patient acknowledged the explanation, agreed to proceed with the plan and consent was signed. Patient is being admitted for inpatient treatment for surgery, pain control, PT, OT, prophylactic antibiotics, VTE prophylaxis, progressive ambulation and ADL's and discharge  planning. The patient is planning to be discharged home with home health services     Patient's anticipated LOS is less than 2 midnights, meeting these requirements: - Younger than 42 - Lives within 1 hour of care - Has a competent adult at home to recover with post-op recover - NO history of  - Chronic pain requiring opiods  - Diabetes  - Coronary Artery Disease  - Heart failure  - Heart attack  - Stroke  - DVT/VTE  - Cardiac arrhythmia  - Respiratory Failure/COPD  - Renal failure  - Anemia  - Advanced Liver disease

## 2020-03-08 ENCOUNTER — Observation Stay (HOSPITAL_COMMUNITY): Payer: Medicare HMO

## 2020-03-08 ENCOUNTER — Ambulatory Visit (HOSPITAL_COMMUNITY): Payer: Medicare HMO | Admitting: Physician Assistant

## 2020-03-08 ENCOUNTER — Other Ambulatory Visit: Payer: Self-pay

## 2020-03-08 ENCOUNTER — Inpatient Hospital Stay (HOSPITAL_COMMUNITY)
Admission: RE | Admit: 2020-03-08 | Discharge: 2020-03-15 | DRG: 470 | Disposition: A | Discharge status: Home Health | Payer: Medicare HMO | Attending: Orthopaedic Surgery | Admitting: Orthopaedic Surgery

## 2020-03-08 ENCOUNTER — Ambulatory Visit (HOSPITAL_COMMUNITY): Payer: Medicare HMO | Admitting: Anesthesiology

## 2020-03-08 ENCOUNTER — Encounter (HOSPITAL_COMMUNITY): Payer: Self-pay | Admitting: Orthopaedic Surgery

## 2020-03-08 ENCOUNTER — Encounter (HOSPITAL_COMMUNITY)
Admission: RE | Disposition: A | Discharge status: Home / Self Care | Payer: Self-pay | Source: Home / Self Care | Attending: Orthopaedic Surgery

## 2020-03-08 DIAGNOSIS — M170 Bilateral primary osteoarthritis of knee: Secondary | ICD-10-CM | POA: Diagnosis not present

## 2020-03-08 DIAGNOSIS — E669 Obesity, unspecified: Secondary | ICD-10-CM | POA: Diagnosis present

## 2020-03-08 DIAGNOSIS — E039 Hypothyroidism, unspecified: Secondary | ICD-10-CM | POA: Diagnosis present

## 2020-03-08 DIAGNOSIS — Z79890 Hormone replacement therapy (postmenopausal): Secondary | ICD-10-CM | POA: Diagnosis not present

## 2020-03-08 DIAGNOSIS — Z20822 Contact with and (suspected) exposure to covid-19: Secondary | ICD-10-CM | POA: Diagnosis present

## 2020-03-08 DIAGNOSIS — Z96651 Presence of right artificial knee joint: Secondary | ICD-10-CM

## 2020-03-08 DIAGNOSIS — E118 Type 2 diabetes mellitus with unspecified complications: Secondary | ICD-10-CM | POA: Diagnosis present

## 2020-03-08 DIAGNOSIS — Z791 Long term (current) use of non-steroidal anti-inflammatories (NSAID): Secondary | ICD-10-CM | POA: Diagnosis not present

## 2020-03-08 DIAGNOSIS — Z79899 Other long term (current) drug therapy: Secondary | ICD-10-CM | POA: Diagnosis not present

## 2020-03-08 DIAGNOSIS — R7303 Prediabetes: Secondary | ICD-10-CM | POA: Diagnosis not present

## 2020-03-08 DIAGNOSIS — M25561 Pain in right knee: Secondary | ICD-10-CM | POA: Diagnosis present

## 2020-03-08 DIAGNOSIS — I10 Essential (primary) hypertension: Secondary | ICD-10-CM | POA: Diagnosis present

## 2020-03-08 DIAGNOSIS — Z7984 Long term (current) use of oral hypoglycemic drugs: Secondary | ICD-10-CM | POA: Diagnosis not present

## 2020-03-08 DIAGNOSIS — M1711 Unilateral primary osteoarthritis, right knee: Secondary | ICD-10-CM | POA: Diagnosis not present

## 2020-03-08 DIAGNOSIS — Z471 Aftercare following joint replacement surgery: Secondary | ICD-10-CM | POA: Diagnosis not present

## 2020-03-08 DIAGNOSIS — Z6835 Body mass index (BMI) 35.0-35.9, adult: Secondary | ICD-10-CM | POA: Diagnosis not present

## 2020-03-08 DIAGNOSIS — Z87891 Personal history of nicotine dependence: Secondary | ICD-10-CM

## 2020-03-08 DIAGNOSIS — M1712 Unilateral primary osteoarthritis, left knee: Secondary | ICD-10-CM | POA: Diagnosis not present

## 2020-03-08 DIAGNOSIS — G8918 Other acute postprocedural pain: Secondary | ICD-10-CM | POA: Diagnosis not present

## 2020-03-08 HISTORY — PX: TOTAL KNEE ARTHROPLASTY: SHX125

## 2020-03-08 LAB — GLUCOSE, CAPILLARY
Glucose-Capillary: 126 mg/dL — ABNORMAL HIGH (ref 70–99)
Glucose-Capillary: 165 mg/dL — ABNORMAL HIGH (ref 70–99)
Glucose-Capillary: 136 mg/dL — ABNORMAL HIGH (ref 70–99)

## 2020-03-08 SURGERY — ARTHROPLASTY, KNEE, TOTAL
Anesthesia: Spinal | Site: Knee | Laterality: Right

## 2020-03-08 MED ORDER — METHOCARBAMOL 500 MG PO TABS
ORAL_TABLET | ORAL | Status: AC
  Filled 2020-03-08: qty 1

## 2020-03-08 MED ORDER — PROPOFOL 1000 MG/100ML IV EMUL
INTRAVENOUS | Status: AC
  Filled 2020-03-08: qty 100

## 2020-03-08 MED ORDER — FENTANYL CITRATE (PF) 100 MCG/2ML IJ SOLN
50.0000 ug | INTRAMUSCULAR | Status: DC
  Administered 2020-03-08: 50 ug via INTRAVENOUS
  Filled 2020-03-08: qty 2

## 2020-03-08 MED ORDER — METFORMIN HCL ER 500 MG PO TB24
1500.0000 mg | ORAL_TABLET | Freq: Every day | ORAL | Status: DC
  Administered 2020-03-09 – 2020-03-14 (×6): 1500 mg via ORAL
  Filled 2020-03-08 (×6): qty 3

## 2020-03-08 MED ORDER — PHENYLEPHRINE HCL-NACL 10-0.9 MG/250ML-% IV SOLN
INTRAVENOUS | Status: DC | PRN
  Administered 2020-03-08: 20 ug/min via INTRAVENOUS

## 2020-03-08 MED ORDER — ACETAMINOPHEN 10 MG/ML IV SOLN: 1000.0000 mg | Freq: Once | INTRAVENOUS | Status: DC | PRN

## 2020-03-08 MED ORDER — VITAMIN D 125 MCG (5000 UT) PO CAPS: 5000.0000 [IU] | ORAL_CAPSULE | Freq: Every day | ORAL | Status: DC

## 2020-03-08 MED ORDER — ONDANSETRON HCL 4 MG/2ML IJ SOLN: 4.0000 mg | Freq: Four times a day (QID) | INTRAMUSCULAR | Status: DC | PRN

## 2020-03-08 MED ORDER — RIVAROXABAN 10 MG PO TABS
10.0000 mg | ORAL_TABLET | Freq: Every day | ORAL | Status: DC
  Administered 2020-03-09 – 2020-03-15 (×7): 10 mg via ORAL
  Filled 2020-03-08 (×7): qty 1

## 2020-03-08 MED ORDER — ROPIVACAINE HCL 7.5 MG/ML IJ SOLN
INTRAMUSCULAR | Status: DC | PRN
  Administered 2020-03-08: 20 mL via PERINEURAL

## 2020-03-08 MED ORDER — STERILE WATER FOR IRRIGATION IR SOLN
Status: DC | PRN
  Administered 2020-03-08: 2000 mL

## 2020-03-08 MED ORDER — ONDANSETRON HCL 4 MG PO TABS: 4.0000 mg | ORAL_TABLET | Freq: Four times a day (QID) | ORAL | Status: DC | PRN

## 2020-03-08 MED ORDER — CEFAZOLIN SODIUM-DEXTROSE 2-4 GM/100ML-% IV SOLN
2.0000 g | INTRAVENOUS | Status: AC
  Administered 2020-03-08: 09:00:00 2 g via INTRAVENOUS
  Filled 2020-03-08: qty 100

## 2020-03-08 MED ORDER — ONDANSETRON HCL 4 MG/2ML IJ SOLN
INTRAMUSCULAR | Status: AC
  Filled 2020-03-08: qty 4

## 2020-03-08 MED ORDER — PROPOFOL 500 MG/50ML IV EMUL
INTRAVENOUS | Status: AC
  Filled 2020-03-08: qty 50

## 2020-03-08 MED ORDER — PROPOFOL 10 MG/ML IV BOLUS
INTRAVENOUS | Status: DC | PRN
  Administered 2020-03-08: 95 ug/kg/min via INTRAVENOUS
  Administered 2020-03-08: 20 mg via INTRAVENOUS

## 2020-03-08 MED ORDER — POVIDONE-IODINE 10 % EX SWAB
2.0000 "application " | Freq: Once | CUTANEOUS | Status: AC
  Administered 2020-03-08: 2 via TOPICAL

## 2020-03-08 MED ORDER — POLYETHYLENE GLYCOL 3350 17 G PO PACK
17.0000 g | PACK | Freq: Every day | ORAL | Status: DC | PRN
  Administered 2020-03-12 – 2020-03-14 (×3): 17 g via ORAL
  Filled 2020-03-08 (×3): qty 1

## 2020-03-08 MED ORDER — 0.9 % SODIUM CHLORIDE (POUR BTL) OPTIME
TOPICAL | Status: DC | PRN
  Administered 2020-03-08: 1000 mL

## 2020-03-08 MED ORDER — KETOROLAC TROMETHAMINE 15 MG/ML IJ SOLN
7.5000 mg | Freq: Four times a day (QID) | INTRAMUSCULAR | Status: AC
  Administered 2020-03-08 – 2020-03-09 (×4): 7.5 mg via INTRAVENOUS
  Filled 2020-03-08 (×3): qty 1

## 2020-03-08 MED ORDER — PHENOL 1.4 % MT LIQD: 1.0000 | OROMUCOSAL | Status: DC | PRN

## 2020-03-08 MED ORDER — ATORVASTATIN CALCIUM 40 MG PO TABS
40.0000 mg | ORAL_TABLET | Freq: Every day | ORAL | Status: DC
  Administered 2020-03-08 – 2020-03-15 (×8): 40 mg via ORAL
  Filled 2020-03-08 (×8): qty 1

## 2020-03-08 MED ORDER — LISINOPRIL 20 MG PO TABS
20.0000 mg | ORAL_TABLET | Freq: Every day | ORAL | Status: DC
  Administered 2020-03-09 – 2020-03-15 (×6): 20 mg via ORAL
  Filled 2020-03-08 (×7): qty 1

## 2020-03-08 MED ORDER — OXYCODONE HCL 5 MG PO TABS
ORAL_TABLET | ORAL | Status: AC
  Filled 2020-03-08: qty 1

## 2020-03-08 MED ORDER — MEPERIDINE HCL 50 MG/ML IJ SOLN: 6.2500 mg | INTRAMUSCULAR | Status: DC | PRN

## 2020-03-08 MED ORDER — PROMETHAZINE HCL 25 MG/ML IJ SOLN: 6.2500 mg | INTRAMUSCULAR | Status: DC | PRN

## 2020-03-08 MED ORDER — ACETAMINOPHEN 325 MG PO TABS: 325.0000 mg | ORAL_TABLET | Freq: Once | ORAL | Status: DC | PRN

## 2020-03-08 MED ORDER — METHOCARBAMOL 500 MG IVPB - SIMPLE MED
500.0000 mg | Freq: Four times a day (QID) | INTRAVENOUS | Status: DC | PRN
  Filled 2020-03-08: qty 50

## 2020-03-08 MED ORDER — DIPHENHYDRAMINE HCL 12.5 MG/5ML PO ELIX
12.5000 mg | ORAL_SOLUTION | ORAL | Status: DC | PRN
  Filled 2020-03-08: qty 10

## 2020-03-08 MED ORDER — LIDOCAINE 2% (20 MG/ML) 5 ML SYRINGE
INTRAMUSCULAR | Status: AC
  Filled 2020-03-08: qty 10

## 2020-03-08 MED ORDER — PHENYLEPHRINE HCL (PRESSORS) 10 MG/ML IV SOLN
INTRAVENOUS | Status: AC
  Filled 2020-03-08: qty 11

## 2020-03-08 MED ORDER — ALUM & MAG HYDROXIDE-SIMETH 200-200-20 MG/5ML PO SUSP: 30.0000 mL | ORAL | Status: DC | PRN

## 2020-03-08 MED ORDER — ZINC SULFATE 220 (50 ZN) MG PO CAPS
220.0000 mg | ORAL_CAPSULE | Freq: Every day | ORAL | Status: DC
  Administered 2020-03-09 – 2020-03-15 (×7): 220 mg via ORAL
  Filled 2020-03-08 (×7): qty 1

## 2020-03-08 MED ORDER — LEVOTHYROXINE SODIUM 75 MCG PO TABS
75.0000 ug | ORAL_TABLET | Freq: Every day | ORAL | Status: DC
  Administered 2020-03-09 – 2020-03-15 (×7): 75 ug via ORAL
  Filled 2020-03-08 (×7): qty 1

## 2020-03-08 MED ORDER — PANTOPRAZOLE SODIUM 40 MG PO TBEC
40.0000 mg | DELAYED_RELEASE_TABLET | Freq: Every day | ORAL | Status: DC
  Administered 2020-03-09 – 2020-03-15 (×7): 40 mg via ORAL
  Filled 2020-03-08 (×7): qty 1

## 2020-03-08 MED ORDER — CEFAZOLIN SODIUM-DEXTROSE 1-4 GM/50ML-% IV SOLN
1.0000 g | Freq: Four times a day (QID) | INTRAVENOUS | Status: AC
  Administered 2020-03-08 (×2): 1 g via INTRAVENOUS
  Filled 2020-03-08 (×3): qty 50

## 2020-03-08 MED ORDER — SODIUM CHLORIDE 0.9 % IV SOLN: INTRAVENOUS | Status: DC

## 2020-03-08 MED ORDER — ZINC GLUCONATE 50 MG PO TABS: 50.0000 mg | ORAL_TABLET | Freq: Every day | ORAL | Status: DC

## 2020-03-08 MED ORDER — LISINOPRIL-HYDROCHLOROTHIAZIDE 20-25 MG PO TABS: 1.0000 | ORAL_TABLET | Freq: Every day | ORAL | Status: DC

## 2020-03-08 MED ORDER — BIOTIN 1000 MCG PO TABS: 1000.0000 ug | ORAL_TABLET | Freq: Every day | ORAL | Status: DC

## 2020-03-08 MED ORDER — OXYCODONE HCL 5 MG PO TABS
10.0000 mg | ORAL_TABLET | ORAL | Status: DC | PRN
  Administered 2020-03-08: 15 mg via ORAL
  Administered 2020-03-09: 10 mg via ORAL
  Administered 2020-03-09: 15 mg via ORAL
  Administered 2020-03-09: 10 mg via ORAL
  Administered 2020-03-09: 15 mg via ORAL
  Administered 2020-03-10 – 2020-03-12 (×6): 10 mg via ORAL
  Filled 2020-03-08: qty 2
  Filled 2020-03-08: qty 3
  Filled 2020-03-08: qty 2
  Filled 2020-03-08: qty 3
  Filled 2020-03-08 (×5): qty 2

## 2020-03-08 MED ORDER — MAGNESIUM OXIDE 400 (241.3 MG) MG PO TABS
400.0000 mg | ORAL_TABLET | Freq: Every day | ORAL | Status: DC
  Administered 2020-03-08 – 2020-03-14 (×7): 400 mg via ORAL
  Filled 2020-03-08 (×8): qty 1

## 2020-03-08 MED ORDER — METHYLPREDNISOLONE ACETATE 40 MG/ML IJ SUSP
INTRAMUSCULAR | Status: DC | PRN
  Administered 2020-03-08: 4 mL via INTRA_ARTICULAR

## 2020-03-08 MED ORDER — SODIUM CHLORIDE 0.9 % IR SOLN
Status: DC | PRN
  Administered 2020-03-08: 1000 mL

## 2020-03-08 MED ORDER — METOCLOPRAMIDE HCL 5 MG PO TABS: 5.0000 mg | ORAL_TABLET | Freq: Three times a day (TID) | ORAL | Status: DC | PRN

## 2020-03-08 MED ORDER — ONDANSETRON HCL 4 MG/2ML IJ SOLN
INTRAMUSCULAR | Status: DC | PRN
  Administered 2020-03-08: 4 mg via INTRAVENOUS

## 2020-03-08 MED ORDER — BUPIVACAINE HCL 0.25 % IJ SOLN
INTRAMUSCULAR | Status: DC | PRN
  Administered 2020-03-08: 47 mL

## 2020-03-08 MED ORDER — LACTATED RINGERS IV SOLN: INTRAVENOUS | Status: DC

## 2020-03-08 MED ORDER — HYDROMORPHONE HCL 1 MG/ML IJ SOLN
0.5000 mg | INTRAMUSCULAR | Status: DC | PRN
  Administered 2020-03-09: 0.5 mg via INTRAVENOUS
  Filled 2020-03-08: qty 1

## 2020-03-08 MED ORDER — DEXAMETHASONE SODIUM PHOSPHATE 10 MG/ML IJ SOLN
INTRAMUSCULAR | Status: AC
  Filled 2020-03-08: qty 2

## 2020-03-08 MED ORDER — ACETAMINOPHEN 160 MG/5ML PO SOLN: 325.0000 mg | Freq: Once | ORAL | Status: DC | PRN

## 2020-03-08 MED ORDER — LIDOCAINE 2% (20 MG/ML) 5 ML SYRINGE
INTRAMUSCULAR | Status: DC | PRN
  Administered 2020-03-08: 80 mg via INTRAVENOUS

## 2020-03-08 MED ORDER — METOCLOPRAMIDE HCL 5 MG/ML IJ SOLN: 5.0000 mg | Freq: Three times a day (TID) | INTRAMUSCULAR | Status: DC | PRN

## 2020-03-08 MED ORDER — METHOCARBAMOL 500 MG PO TABS
500.0000 mg | ORAL_TABLET | Freq: Four times a day (QID) | ORAL | Status: DC | PRN
  Administered 2020-03-08 – 2020-03-15 (×20): 500 mg via ORAL
  Filled 2020-03-08 (×19): qty 1

## 2020-03-08 MED ORDER — OXYCODONE HCL 5 MG PO TABS
ORAL_TABLET | ORAL | Status: AC
  Filled 2020-03-08: qty 2

## 2020-03-08 MED ORDER — MIDAZOLAM HCL 2 MG/2ML IJ SOLN
1.0000 mg | INTRAMUSCULAR | Status: DC
  Administered 2020-03-08: 1 mg via INTRAVENOUS
  Filled 2020-03-08: qty 2

## 2020-03-08 MED ORDER — GABAPENTIN 100 MG PO CAPS
100.0000 mg | ORAL_CAPSULE | Freq: Three times a day (TID) | ORAL | Status: DC
  Administered 2020-03-08 – 2020-03-15 (×20): 100 mg via ORAL
  Filled 2020-03-08 (×20): qty 1

## 2020-03-08 MED ORDER — VITAMIN D 25 MCG (1000 UNIT) PO TABS
5000.0000 [IU] | ORAL_TABLET | Freq: Every day | ORAL | Status: DC
  Administered 2020-03-09 – 2020-03-15 (×7): 5000 [IU] via ORAL
  Filled 2020-03-08 (×7): qty 5

## 2020-03-08 MED ORDER — KETOROLAC TROMETHAMINE 15 MG/ML IJ SOLN
INTRAMUSCULAR | Status: AC
  Filled 2020-03-08: qty 1

## 2020-03-08 MED ORDER — MENTHOL 3 MG MT LOZG: 1.0000 | LOZENGE | OROMUCOSAL | Status: DC | PRN

## 2020-03-08 MED ORDER — TRANEXAMIC ACID-NACL 1000-0.7 MG/100ML-% IV SOLN
1000.0000 mg | INTRAVENOUS | Status: AC
  Administered 2020-03-08: 09:00:00 1000 mg via INTRAVENOUS
  Filled 2020-03-08: qty 100

## 2020-03-08 MED ORDER — BUPIVACAINE IN DEXTROSE 0.75-8.25 % IT SOLN
INTRATHECAL | Status: DC | PRN
  Administered 2020-03-08: 1.7 mL via INTRATHECAL

## 2020-03-08 MED ORDER — DEXAMETHASONE SODIUM PHOSPHATE 10 MG/ML IJ SOLN
INTRAMUSCULAR | Status: DC | PRN
  Administered 2020-03-08: 6 mg via INTRAVENOUS

## 2020-03-08 MED ORDER — BUPIVACAINE HCL 0.25 % IJ SOLN
INTRAMUSCULAR | Status: AC
  Filled 2020-03-08: qty 1

## 2020-03-08 MED ORDER — HYDROMORPHONE HCL 1 MG/ML IJ SOLN: 0.2500 mg | INTRAMUSCULAR | Status: DC | PRN

## 2020-03-08 MED ORDER — OXYCODONE HCL 5 MG PO TABS
5.0000 mg | ORAL_TABLET | ORAL | Status: DC | PRN
  Administered 2020-03-08: 5 mg via ORAL
  Administered 2020-03-08 – 2020-03-14 (×16): 10 mg via ORAL
  Administered 2020-03-14 (×2): 5 mg via ORAL
  Administered 2020-03-15 (×3): 10 mg via ORAL
  Filled 2020-03-08 (×5): qty 2
  Filled 2020-03-08: qty 1
  Filled 2020-03-08 (×2): qty 2
  Filled 2020-03-08: qty 1
  Filled 2020-03-08 (×3): qty 2
  Filled 2020-03-08: qty 1
  Filled 2020-03-08 (×12): qty 2
  Filled 2020-03-08: qty 1

## 2020-03-08 MED ORDER — DOCUSATE SODIUM 100 MG PO CAPS
100.0000 mg | ORAL_CAPSULE | Freq: Two times a day (BID) | ORAL | Status: DC
  Administered 2020-03-08 – 2020-03-15 (×14): 100 mg via ORAL
  Filled 2020-03-08 (×14): qty 1

## 2020-03-08 MED ORDER — HYDROCHLOROTHIAZIDE 25 MG PO TABS
25.0000 mg | ORAL_TABLET | Freq: Every day | ORAL | Status: DC
  Administered 2020-03-09 – 2020-03-15 (×6): 25 mg via ORAL
  Filled 2020-03-08 (×7): qty 1

## 2020-03-08 MED ORDER — METHYLPREDNISOLONE ACETATE 40 MG/ML IJ SUSP
INTRAMUSCULAR | Status: AC
  Filled 2020-03-08: qty 1

## 2020-03-08 MED ORDER — ACETAMINOPHEN 325 MG PO TABS
325.0000 mg | ORAL_TABLET | Freq: Four times a day (QID) | ORAL | Status: DC | PRN
  Administered 2020-03-11 – 2020-03-15 (×6): 650 mg via ORAL
  Filled 2020-03-08 (×6): qty 2

## 2020-03-08 MED ORDER — PROPOFOL 10 MG/ML IV BOLUS
INTRAVENOUS | Status: AC
  Filled 2020-03-08: qty 40

## 2020-03-08 SURGICAL SUPPLY — 54 items
BAG ZIPLOCK 12X15 (MISCELLANEOUS) IMPLANT
BASEPLATE TIBIAL TRIATHALON 3 (Plate) ×1 IMPLANT
BEARIN INSERT TIBIAL SZ 3 11 (Insert) ×2 IMPLANT
BEARING INSERT TIBIAL SZ 3 11 (Insert) IMPLANT
BENZOIN TINCTURE PRP APPL 2/3 (GAUZE/BANDAGES/DRESSINGS) IMPLANT
BLADE SAG 18X100X1.27 (BLADE) IMPLANT
BLADE SURG SZ10 CARB STEEL (BLADE) ×4 IMPLANT
BNDG ELASTIC 6X5.8 VLCR STR LF (GAUZE/BANDAGES/DRESSINGS) ×3 IMPLANT
BOWL SMART MIX CTS (DISPOSABLE) IMPLANT
CEMENT BONE SIMPLEX SPEEDSET (Cement) ×2 IMPLANT
COVER SURGICAL LIGHT HANDLE (MISCELLANEOUS) ×2 IMPLANT
COVER WAND RF STERILE (DRAPES) IMPLANT
CUFF TOURN SGL QUICK 34 (TOURNIQUET CUFF) ×1
CUFF TRNQT CYL 34X4.125X (TOURNIQUET CUFF) ×1 IMPLANT
DECANTER SPIKE VIAL GLASS SM (MISCELLANEOUS) IMPLANT
DRAPE U-SHAPE 47X51 STRL (DRAPES) ×2 IMPLANT
DRSG PAD ABDOMINAL 8X10 ST (GAUZE/BANDAGES/DRESSINGS) ×4 IMPLANT
DURAPREP 26ML APPLICATOR (WOUND CARE) ×2 IMPLANT
ELECT REM PT RETURN 15FT ADLT (MISCELLANEOUS) ×2 IMPLANT
FEMORAL PEG DISTAL FIXATION (Orthopedic Implant) ×1 IMPLANT
FEMORAL POST STABILIZED NO 3 (Orthopedic Implant) ×1 IMPLANT
GAUZE SPONGE 4X4 12PLY STRL (GAUZE/BANDAGES/DRESSINGS) ×2 IMPLANT
GAUZE XEROFORM 1X8 LF (GAUZE/BANDAGES/DRESSINGS) ×1 IMPLANT
GLOVE BIO SURGEON STRL SZ7.5 (GLOVE) ×2 IMPLANT
GLOVE BIOGEL PI IND STRL 8 (GLOVE) ×2 IMPLANT
GLOVE BIOGEL PI INDICATOR 8 (GLOVE) ×2
GLOVE ECLIPSE 8.0 STRL XLNG CF (GLOVE) ×2 IMPLANT
GOWN STRL REUS W/TWL XL LVL3 (GOWN DISPOSABLE) ×4 IMPLANT
HANDPIECE INTERPULSE COAX TIP (DISPOSABLE) ×1
HOLDER FOLEY CATH W/STRAP (MISCELLANEOUS) IMPLANT
IMMOBILIZER KNEE 20 (SOFTGOODS) ×2 IMPLANT
IMMOBILIZER KNEE 20 THIGH 36 (SOFTGOODS) ×1 IMPLANT
KIT TURNOVER KIT A (KITS) IMPLANT
NS IRRIG 1000ML POUR BTL (IV SOLUTION) ×2 IMPLANT
PACK TOTAL KNEE CUSTOM (KITS) ×2 IMPLANT
PADDING CAST COTTON 6X4 STRL (CAST SUPPLIES) ×4 IMPLANT
PATELLA TRIATHLON SZ 29 9 MM (Orthopedic Implant) ×1 IMPLANT
PENCIL SMOKE EVACUATOR (MISCELLANEOUS) IMPLANT
PIN FLUTED HEDLESS FIX 3.5X1/8 (PIN) ×1 IMPLANT
PROTECTOR NERVE ULNAR (MISCELLANEOUS) ×2 IMPLANT
SET HNDPC FAN SPRY TIP SCT (DISPOSABLE) ×1 IMPLANT
SET PAD KNEE POSITIONER (MISCELLANEOUS) ×2 IMPLANT
STAPLER VISISTAT 35W (STAPLE) IMPLANT
STRIP CLOSURE SKIN 1/2X4 (GAUZE/BANDAGES/DRESSINGS) IMPLANT
SUT MNCRL AB 4-0 PS2 18 (SUTURE) IMPLANT
SUT VIC AB 0 CT1 27 (SUTURE) ×1
SUT VIC AB 0 CT1 27XBRD ANTBC (SUTURE) ×1 IMPLANT
SUT VIC AB 1 CT1 36 (SUTURE) ×4 IMPLANT
SUT VIC AB 2-0 CT1 27 (SUTURE) ×2
SUT VIC AB 2-0 CT1 TAPERPNT 27 (SUTURE) ×2 IMPLANT
TRAY FOLEY MTR SLVR 16FR STAT (SET/KITS/TRAYS/PACK) ×2 IMPLANT
WATER STERILE IRR 1000ML POUR (IV SOLUTION) ×2 IMPLANT
WRAP KNEE MAXI GEL POST OP (GAUZE/BANDAGES/DRESSINGS) ×2 IMPLANT
YANKAUER SUCT BULB TIP 10FT TU (MISCELLANEOUS) ×2 IMPLANT

## 2020-03-08 NOTE — Anesthesia Procedure Notes (Signed)
Anesthesia Regional Block: Adductor canal block   Pre-Anesthetic Checklist: ,, timeout performed, Correct Patient, Correct Site, Correct Laterality, Correct Procedure, Correct Position, site marked, Risks and benefits discussed,  Surgical consent,  Pre-op evaluation,  At surgeon's request and post-op pain management  Laterality: Right  Prep: chloraprep       Needles:  Injection technique: Single-shot  Needle Type: Echogenic Stimulator Needle     Needle Length: 9cm  Needle Gauge: 21     Additional Needles:   Procedures:,,,, ultrasound used (permanent image in chart),,,,  Narrative:  Start time: 03/08/2020 7:55 AM End time: 03/08/2020 8:05 AM Injection made incrementally with aspirations every 5 mL.  Performed by: Personally  Anesthesiologist: Effie Berkshire, MD  Additional Notes: Patient tolerated the procedure well. Local anesthetic introduced in an incremental fashion under minimal resistance after negative aspirations. No paresthesias were elicited. After completion of the procedure, no acute issues were identified and patient continued to be monitored by RN.

## 2020-03-08 NOTE — Progress Notes (Signed)
PHARMACIST - PHYSICIAN ORDER COMMUNICATION  CONCERNING: P&T Medication Policy on Herbal Medications  DESCRIPTION:  This patient's order for:  biotin  has been noted.  This product(s) is classified as an "herbal" or natural product. Due to a lack of definitive safety studies or FDA approval, nonstandard manufacturing practices, plus the potential risk of unknown drug-drug interactions while on inpatient medications, the Pharmacy and Therapeutics Committee does not permit the use of "herbal" or natural products of this type within [facility].   ACTION TAKEN: The pharmacy department is unable to verify this order at this time and your patient has been informed of this safety policy. Please reevaluate patient's clinical condition at discharge and address if the herbal or natural product(s) should be resumed at that time.  Peggyann Juba, PharmD, BCPS Pharmacy: [phone] 03/08/2020 3:25 PM

## 2020-03-08 NOTE — Progress Notes (Signed)
Orthopedic Tech Progress Note Patient Details:  Melissa [name] May 01, 1951 [id number]  CPM Right Knee CPM Right Knee: On Right Knee Flexion (Degrees): 90 Right Knee Extension (Degrees): 0    Ortho Devices Type of Ortho Device: CPM padding Ortho Device/Splint Location: RLE Ortho Device/Splint Interventions: Ordered, Application, Adjustment       Lyan Moyano N Natajah Derderian 03/08/2020, 11:26 AM

## 2020-03-08 NOTE — Brief Op Note (Signed)
03/08/2020  10:11 AM  PATIENT:  Melissa [name]  69 y.o. female  PRE-OPERATIVE DIAGNOSIS:  bilateral knee osteoarthritis  POST-OPERATIVE DIAGNOSIS:  bilateral knee osteoarthritis  PROCEDURE:  Procedure(s): RIGHT TOTAL KNEE ARTHROPLASTY AND LEFT KNEE STEROID INJECTION (Right)  SURGEON:  Surgeon(s) and Role:    Mcarthur Rossetti, MD - Primary  PHYSICIAN ASSISTANT: Benita Stabile, PA-C   ANESTHESIA:   local, regional and spinal  EBL:  50 mL   COUNTS:  YES  TOURNIQUET:   Total Tourniquet Time Documented: Thigh (Right) - 55 minutes Total: Thigh (Right) - 55 minutes   DICTATION: .Other Dictation: Dictation Number [id number]  PLAN OF CARE: Admit for overnight observation  PATIENT DISPOSITION:  PACU - hemodynamically stable.   Delay start of Pharmacological VTE agent (>24hrs) due to surgical blood loss or risk of bleeding: no

## 2020-03-08 NOTE — Anesthesia Procedure Notes (Signed)
Spinal  Start time: 03/08/2020 8:30 AM End time: 03/08/2020 8:32 AM Staffing Performed: anesthesiologist  Anesthesiologist: Effie Berkshire, MD Preanesthetic Checklist Completed: patient identified, IV checked, site marked, risks and benefits discussed, surgical consent, monitors and equipment checked, pre-op evaluation and timeout performed Spinal Block Patient position: sitting Prep: DuraPrep and site prepped and draped Location: L3-4 Injection technique: single-shot Needle Needle type: Pencan  Needle gauge: 24 G Needle length: 10 cm Needle insertion depth: 10 cm Additional Notes Patient tolerated well. No immediate complications.

## 2020-03-08 NOTE — Anesthesia Postprocedure Evaluation (Signed)
Anesthesia Post Note  Patient: Melissa [name]  Procedure(s) Performed: RIGHT TOTAL KNEE ARTHROPLASTY AND LEFT KNEE STEROID INJECTION (Right Knee)     Patient location during evaluation: PACU Anesthesia Type: Spinal Level of consciousness: oriented and awake and alert Pain management: pain level controlled Vital Signs Assessment: post-procedure vital signs reviewed and stable Respiratory status: spontaneous breathing, respiratory function stable and patient connected to nasal cannula oxygen Cardiovascular status: blood pressure returned to baseline and stable Postop Assessment: no headache, no backache and no apparent nausea or vomiting Anesthetic complications: no    Last Vitals:  Vitals:   03/08/20 1300 03/08/20 1400  BP: 121/72 124/74  Pulse: 77   Resp:  16  Temp: 37 C 37 C  SpO2: 100%     Last Pain:  Vitals:   03/08/20 1400  TempSrc:   PainSc: [place]

## 2020-03-08 NOTE — Evaluation (Signed)
Physical Therapy Evaluation Patient Details Name: Melissa [name] MRN: [id number] DOB: [birth year] Today's Date: 03/08/2020   History of Present Illness  Patient is 69 y.o. female s/p Rt TKA on 03/08/20 with PMH significant for hypothyroidism, HTN, OA.   Clinical Impression  MICHAELINA TAMBURO is a 69 y.o. female POD 0 s/p Rt TKA. Patient reports independence with mobility at baseline. Patient is now limited by functional impairments (see PT problem list below) and requires min assist for transfers and gait with RW. Patient was able to ambulate ~40 feet with RW and min assist. Patient instructed in exercise to facilitate ROM and circulation. Patient will benefit from continued skilled PT interventions to address impairments and progress towards PLOF. Acute PT will follow to progress mobility and stair training in preparation for safe discharge home.     Follow Up Recommendations Follow surgeon's recommendation for DC plan and follow-up therapies;SNF    Equipment Recommendations  Rolling walker with 5" wheels    Recommendations for Other Services       Precautions / Restrictions Precautions Precautions: Fall Restrictions Weight Bearing Restrictions: No Other Position/Activity Restrictions: WBAT      Mobility  Bed Mobility Overal bed mobility: Needs Assistance Bed Mobility: Supine to Sit     Supine to sit: HOB elevated;Min assist     General bed mobility comments: cues to reach for bed rail and assist to mobilize Rt LE and raise trunk.  Transfers Overall transfer level: Needs assistance Equipment used: Rolling walker (2 wheeled) Transfers: Sit to/from Stand Sit to Stand: Min assist         General transfer comment: cues for hand placement with RW, assist for power up and to steady.   Ambulation/Gait Ambulation/Gait assistance: Min assist Gait Distance (Feet): 40 Feet Assistive device: Rolling walker (2 wheeled) Gait Pattern/deviations: Step-to pattern;Decreased step  length - left;Decreased stance time - right;Decreased stride length;Decreased weight shift to right;Antalgic Gait velocity: decreased   General Gait Details: cues for step pattern and safe proximity to RW, assist to manage walker position. no overt LOB noted, no buckling at Rt knee noted.  Stairs            Wheelchair Mobility    Modified Rankin (Stroke Patients Only)       Balance Overall balance assessment: Needs assistance Sitting-balance support: No upper extremity supported;Feet supported Sitting balance-Leahy Scale: Good     Standing balance support: Bilateral upper extremity supported;During functional activity Standing balance-Leahy Scale: Poor                Pertinent Vitals/Pain Pain Assessment: 0-10 Pain Score: 5  Pain Location: Rt knee Pain Descriptors / Indicators: Aching;Discomfort;Sore Pain Intervention(s): Limited activity within patient's tolerance;Monitored during session;Repositioned;Ice applied    Home Living Family/patient expects to be discharged to:: Skilled nursing facility([facility] for ~10 days) Living Arrangements: Alone Available Help at Discharge: Family(brother is going to stay with her for 1 week ) Type of Home: House Home Access: Stairs to enter Entrance Stairs-Rails: None Entrance Stairs-Number of Steps: 3 Home Layout: Two level;Bed/bath upstairs;1/2 bath on main level Home Equipment: None      Prior Function Level of Independence: Independent            Hand Dominance   Dominant Hand: Right    Extremity/Trunk Assessment   Upper Extremity Assessment Upper Extremity Assessment: Overall WFL for tasks assessed    Lower Extremity Assessment Lower Extremity Assessment: RLE deficits/detail RLE Deficits / Details: good quad activation, no extensor lag with  SLR RLE Sensation: WNL RLE Coordination: WNL    Cervical / Trunk Assessment Cervical / Trunk Assessment: Normal  Communication   Communication: No  difficulties  Cognition Arousal/Alertness: Awake/alert Behavior During Therapy: WFL for tasks assessed/performed Overall Cognitive Status: Within Functional Limits for tasks assessed           General Comments      Exercises Total Joint Exercises Ankle Circles/Pumps: AROM;Both;20 reps;Seated Quad Sets: AROM;Right;5 reps;Seated Heel Slides: AROM;Right;5 reps;Seated   Assessment/Plan    PT Assessment Patient needs continued PT services  PT Problem List Decreased strength;Decreased range of motion;Decreased activity tolerance;Decreased balance;Decreased mobility;Decreased knowledge of use of DME;Decreased knowledge of precautions       PT Treatment Interventions DME instruction;Gait training;Stair training;Functional mobility training;Therapeutic activities;Therapeutic exercise;Balance training;Patient/family education    PT Goals (Current goals can be found in the Care Plan section)  Acute Rehab PT Goals Patient Stated Goal: be able to walk again with out pain PT Goal Formulation: With patient Time For Goal Achievement: 03/15/20 Potential to Achieve Goals: Good    Frequency 7X/week    AM-PAC PT "6 Clicks" Mobility  Outcome Measure Help needed turning from your back to your side while in a flat bed without using bedrails?: A Little Help needed moving from lying on your back to sitting on the side of a flat bed without using bedrails?: A Little Help needed moving to and from a bed to a chair (including a wheelchair)?: A Little Help needed standing up from a chair using your arms (e.g., wheelchair or bedside chair)?: A Little Help needed to walk in hospital room?: A Little Help needed climbing 3-5 steps with a railing? : A Little 6 Click Score: 18    End of Session Equipment Utilized During Treatment: Gait belt Activity Tolerance: Patient tolerated treatment well Patient left: in chair;with call bell/phone within reach;with chair alarm set Nurse Communication: Mobility  status PT Visit Diagnosis: Muscle weakness (generalized) (M62.81);Difficulty in walking, not elsewhere classified (R26.2)    Time: [id number] PT Time Calculation (min) (ACUTE ONLY): 24 min   Charges:   PT Evaluation $PT Eval Low Complexity: 1 Low PT Treatments $Therapeutic Exercise: 8-22 mins        Verner Mould, DPT Physical Therapist with [facility] [phone]  03/08/2020 5:49 PM

## 2020-03-08 NOTE — Op Note (Signed)
NAME: Melissa [name], [name] MEDICAL RECORD [id number] ACCOUNT [id number] DATE OF BIRTH:1951/11/08 FACILITY: [facility] LOCATION: [id number] PHYSICIAN:Aiman Sonn [place], MD  OPERATIVE REPORT  DATE OF PROCEDURE:  03/08/2020  PREOPERATIVE DIAGNOSIS:  Severe osteoarthritis and degenerative joint disease, right knee.  POSTOPERATIVE DIAGNOSIS:  Severe osteoarthritis and degenerative joint disease, right knee.  PROCEDURE:  Right total knee arthroplasty.  IMPLANTS:  Stryker Triathlon cemented knee system with size 3 femur, size 3 tibial tray, 11 mm fixed bearing polyethylene insert, size 29 patellar button.  SURGEON:  Lind Guest. Ninfa Linden, MD  ASSISTANT:  Erskine Emery, PA-C.  ANESTHESIA: 1.  Right lower extremity adductor canal block. 2.  General.  TOURNIQUET TIME:  Under 1 hour.  ESTIMATED BLOOD LOSS:  100 mL.  ANTIBIOTICS:  Two g IV Ancef.  COMPLICATIONS:  None.  INDICATIONS:  The patient is a 69 year old female, well known to me.  I have seen her for many years now.  She has debilitating arthritis with valgus malalignment of her right knee.  She does show bone-on-bone wear on x-rays as well.  We have tried and  failed all forms of conservative treatment, including rest, ice, heat, anti-inflammatories, steroid injections and hyaluronic acid.  At this point, her right knee pain is daily and is detrimentally affecting her activities of daily living, her quality of  life and her mobility.  She also has osteoarthritis in her left knee.  At this point, she does wish to proceed with a right total knee arthroplasty.  Intraoperatively, she would like [place] to go ahead and place a steroid injection in her left knee, which I  think is reasonable.  We had a long and thorough discussion about the risk of acute blood loss anemia, nerve or vessel injury, fracture, infection, DVT, and implant failure.  We talked about our goals being decreased pain, improved mobility and overall  improved quality of  life.  DESCRIPTION OF PROCEDURE:  After informed consent was obtained and appropriate right knee and left knee were marked with the left knee for a knee injection with a steroid and the right knee for total knee arthroplasty.  She was brought to the operating  room and sat up on the operating table.  Spinal anesthesia was then obtained.  She was then laid in supine position on the operating table.  Foley catheter was placed.  We did do a quick timeout for her left knee for the steroid injection.  We prepped  this area with just alcohol swab and then placed a mixture of 3 mL of Marcaine and 1 mL of Depo-Medrol, which she tolerated well.  We placed a Band-Aid on this area.  Her right operative knee for the total knee arthroplasty then had a nonsterile  tourniquet placed on the upper right thigh and the right thigh, knee, leg, ankle and foot were prepped and draped with DuraPrep and sterile drapes including a sterile stockinette.  Time-out was called and she was identified as correct patient, correct  right knee for a total knee arthroplasty.  We then used an Esmarch to wrap that leg and tourniquet was inflated to 300 mm of pressure.  I then made a direct midline incision over the patella and carried this proximally and distally.  We dissected the  knee joint and carried out a medial parapatellar arthrotomy, finding a very large joint effusion.  Once we opened up the knee, you could see there was significant wear in the knee, especially with valgus malalignment.  The lateral compartment was  completely  denuded of cartilage.  We removed remnants of ACL, PCL, medial and lateral meniscus and with the knee in a flexed position, used the extramedullary cutting guide for making our proximal tibia cut.  We set this to take 9 mm off the high side,  correcting varus and valgus and neutral slope.  We made this cut without difficulty.  We then used an intramedullary guide for making our distal femoral cut, an 8 mm distal  femoral cut.  We set this for right knee.  We made our distal femoral cut and it  did not really cut much of the lateral femoral condyle at all, so we backed this down 2 more millimeters.  We made that cut without difficulty and then we brought the knee back down to full extension.  We removed remnants of the meniscus from the back of  the knee and placed our 9 mm extension block and we achieved full extension.  We then went back to the femur and put our femoral sizing guide based off the epicondylar axis and [place] line.  Based off that, we chose a size 3 femur.  We put a 4-in-1  cutting block for a size 3 femur, made our anterior and posterior cuts, followed by our chamfer cuts.  We then made our femoral box cut for a size 3.  Attention was then turned back to the tibia.  We set our tibial rotation off the femur and the tibial  tubercle, choosing a size 3 tray for tibial coverage.  We made our keel cut off of this.  We then placed our size 3 trial tibia, followed by our 3 trial right femur and trialed a 9 and then 11 mm fixed bearing polyethylene insert and we were pleased with  the 11 mm insert.  We then made our patellar cut and drilled 3 holes for a size 29 patellar button.  With all trial instrumentation in the knee,  I put the knee through several cycles of motion and we were pleased with range of motion and stability and  alignment.  We then removed all trial instrumentation from the knee and irrigated the knee with normal saline solution.  We then dried the knee real well and placed intra-articular plain Marcaine injection around the arthrotomy.  We then mixed our cement  with the knee in a flexed position, cemented our size 3 Stryker tibial tray, followed by our size 3 right femur.  We placed our 11 mm fixed bearing polyethylene insert and cemented our patellar button.  We then held the knee in a fully hyperextended  position, compressing the implants while the cement cured.  Once it had cured,  we removed all instrumentation, put the knee through cycles of motion.  We were pleased with range of motion.  We removed any cement debris from the knee and let the  tourniquet down.  Hemostasis was obtained with electrocautery.  We then closed the arthrotomy with interrupted #1 Vicryl suture, followed by 0 Vicryl to close the deep tissue, 0 Vicryl to close the subcutaneous tissue.  Staples were used to close the  skin.  Xeroform and well-padded sterile dressing was applied.  She was taken to recovery room in stable condition.  All final counts were correct.  There were no complications noted.  Of note, Benita Stabile, PA-C, assisted during the entire case and his  assistance was crucial for facilitating all aspects of this case.  VN/NUANCE  D:03/08/2020 T:03/08/2020 JOB:[id number]

## 2020-03-08 NOTE — Transfer of Care (Signed)
Immediate Anesthesia Transfer of Care Note  Patient: Melissa [name]  Procedure(s) Performed: RIGHT TOTAL KNEE ARTHROPLASTY AND LEFT KNEE STEROID INJECTION (Right Knee)  Patient Location: PACU  Anesthesia Type:MAC and Spinal  Level of Consciousness: awake, alert , oriented and patient cooperative  Airway & Oxygen Therapy: Patient Spontanous Breathing and Patient connected to face mask oxygen  Post-op Assessment: Report given to RN and Post -op Vital signs reviewed and stable  Post vital signs: Reviewed and stable  Last Vitals:  Vitals Value Taken Time  BP 103/65 03/08/20 1033  Temp    Pulse 79 03/08/20 1035  Resp 15 03/08/20 1035  SpO2 100 % 03/08/20 1035  Vitals shown include unvalidated device data.  Last Pain:  Vitals:   03/08/20 0639  TempSrc:   PainSc: 0-No pain      Patients Stated Pain Goal: 4 (64/68/03 2122)  Complications: No apparent anesthesia complications

## 2020-03-08 NOTE — Progress Notes (Signed)
Assisted Dr. Kevin Hollis with Right Knee Adductor Canal block. Side rails up, monitors on throughout procedure. See vital signs in flow sheet. Tolerated Procedure well.  

## 2020-03-08 NOTE — H&P (Signed)
The patient understands fully that we are proceeding to surgery today for a right total knee arthroplasty to treat the osteoarthritis of the right knee.  There has been no recent change in her medical status.  See recent H&P.  The risk of benefits of surgery been discussed in detail and informed consent was obtained.  The right knee has been marked.

## 2020-03-08 NOTE — Discharge Instructions (Addendum)
TAKE A BABY ASPIRIN (81MG ) TWICE DAILY.   INSTRUCTIONS AFTER JOINT REPLACEMENT   o Remove items at home which could result in a fall. This includes throw rugs or furniture in walking pathways o ICE to the affected joint every three hours while awake for 30 minutes at a time, for at least the first 3-5 days, and then as needed for pain and swelling.  Continue to use ice for pain and swelling. You may notice swelling that will progress down to the foot and ankle.  This is normal after surgery.  Elevate your leg when you are not up walking on it.   o Continue to use the breathing machine you got in the hospital (incentive spirometer) which will help keep your temperature down.  It is common for your temperature to cycle up and down following surgery, especially at night when you are not up moving around and exerting yourself.  The breathing machine keeps your lungs expanded and your temperature down.   DIET:  As you were doing prior to hospitalization, we recommend a well-balanced diet.  DRESSING / WOUND CARE / SHOWERING  Keep the surgical dressing until follow up.  The dressing is water proof, so you can shower without any extra covering.  IF THE DRESSING FALLS OFF or the wound gets wet inside, change the dressing with sterile gauze.  Please use good hand washing techniques before changing the dressing.  Do not use any lotions or creams on the incision until instructed by your surgeon.    ACTIVITY  o Increase activity slowly as tolerated, but follow the weight bearing instructions below.   o No driving for 6 weeks or until further direction given by your physician.  You cannot drive while taking narcotics.  o No lifting or carrying greater than 10 lbs. until further directed by your surgeon. o Avoid periods of inactivity such as sitting longer than an hour when not asleep. This helps prevent blood clots.  o You may return to work once you are authorized by your doctor.     WEIGHT BEARING    Weight bearing as tolerated with assist device (walker, cane, etc) as directed, use it as long as suggested by your surgeon or therapist, typically at least 4-6 weeks.   EXERCISES  Results after joint replacement surgery are often greatly improved when you follow the exercise, range of motion and muscle strengthening exercises prescribed by your doctor. Safety measures are also important to protect the joint from further injury. Any time any of these exercises cause you to have increased pain or swelling, decrease what you are doing until you are comfortable again and then slowly increase them. If you have problems or questions, call your caregiver or physical therapist for advice.   Rehabilitation is important following a joint replacement. After just a few days of immobilization, the muscles of the leg can become weakened and shrink (atrophy).  These exercises are designed to build up the tone and strength of the thigh and leg muscles and to improve motion. Often times heat used for twenty to thirty minutes before working out will loosen up your tissues and help with improving the range of motion but do not use heat for the first two weeks following surgery (sometimes heat can increase post-operative swelling).   These exercises can be done on a training (exercise) mat, on the floor, on a table or on a bed. Use whatever works the best and is most comfortable for you.    Use  music or television while you are exercising so that the exercises are a pleasant break in your day. This will make your life better with the exercises acting as a break in your routine that you can look forward to.   Perform all exercises about fifteen times, three times per day or as directed.  You should exercise both the operative leg and the other leg as well.  Exercises include:   . Quad Sets - Tighten up the muscle on the front of the thigh (Quad) and hold for 5-10 seconds.   . Straight Leg Raises - With your knee  straight (if you were given a brace, keep it on), lift the leg to 60 degrees, hold for 3 seconds, and slowly lower the leg.  Perform this exercise against resistance later as your leg gets stronger.  . Leg Slides: Lying on your back, slowly slide your foot toward your buttocks, bending your knee up off the floor (only go as far as is comfortable). Then slowly slide your foot back down until your leg is flat on the floor again.  [place] Haring Wings: Lying on your back spread your legs to the side as far apart as you can without causing discomfort.  . Hamstring Strength:  Lying on your back, push your heel against the floor with your leg straight by tightening up the muscles of your buttocks.  Repeat, but this time bend your knee to a comfortable angle, and push your heel against the floor.  You may put a pillow under the heel to make it more comfortable if necessary.   A rehabilitation program following joint replacement surgery can speed recovery and prevent re-injury in the future due to weakened muscles. Contact your doctor or a physical therapist for more information on knee rehabilitation.    CONSTIPATION  Constipation is defined medically as fewer than three stools per week and severe constipation as less than one stool per week.  Even if you have a regular bowel pattern at home, your normal regimen is likely to be disrupted due to multiple reasons following surgery.  Combination of anesthesia, postoperative narcotics, change in appetite and fluid intake all can affect your bowels.   YOU MUST use at least one of the following options; they are listed in order of increasing strength to get the job done.  They are all available over the counter, and you may need to use some, POSSIBLY even all of these options:    Drink plenty of fluids (prune juice may be helpful) and high fiber foods Colace 100 mg by mouth twice a day  Senokot for constipation as directed and as needed Dulcolax (bisacodyl), take with  full glass of water  Miralax (polyethylene glycol) once or twice a day as needed.  If you have tried all these things and are unable to have a bowel movement in the first 3-4 days after surgery call either your surgeon or your primary doctor.    If you experience loose stools or diarrhea, hold the medications until you stool forms back up.  If your symptoms do not get better within 1 week or if they get worse, check with your doctor.  If you experience "the worst abdominal pain ever" or develop nausea or vomiting, please contact the office immediately for further recommendations for treatment.   ITCHING:  If you experience itching with your medications, try taking only a single pain pill, or even half a pain pill at a time.  You can also use  Benadryl over the counter for itching or also to help with sleep.   TED HOSE STOCKINGS:  Use stockings on both legs until for at least 2 weeks or as directed by physician office. They may be removed at night for sleeping.  MEDICATIONS:  See your medication summary on the "After Visit Summary" that nursing will review with you.  You may have some home medications which will be placed on hold until you complete the course of blood thinner medication.  It is important for you to complete the blood thinner medication as prescribed.  PRECAUTIONS:  If you experience chest pain or shortness of breath - call 911 immediately for transfer to the hospital emergency department.   If you develop a fever greater that 101 F, purulent drainage from wound, increased redness or drainage from wound, foul odor from the wound/dressing, or calf pain - CONTACT YOUR SURGEON.                                                   FOLLOW-UP APPOINTMENTS:  If you do not already have a post-op appointment, please call the office for an appointment to be seen by your surgeon.  Guidelines for how soon to be seen are listed in your "After Visit Summary", but are typically between 1-4 weeks after  surgery.  OTHER INSTRUCTIONS:   Knee Replacement:  Do not place pillow under knee, focus on keeping the knee straight while resting. CPM instructions: 0-90 degrees, 2 hours in the morning, 2 hours in the afternoon, and 2 hours in the evening. Place foam block, curve side up under heel at all times except when in CPM or when walking.  DO NOT modify, tear, cut, or change the foam block in any way.   DENTAL ANTIBIOTICS:  In most cases prophylactic antibiotics for Dental procdeures after total joint surgery are not necessary.  Exceptions are as follows:  1. History of prior total joint infection  2. Severely immunocompromised (Organ Transplant, cancer chemotherapy, Rheumatoid biologic meds such as [place])  3. Poorly controlled diabetes (A1C &gt; 8.0, blood glucose over 200)  If you have one of these conditions, contact your surgeon for an antibiotic prescription, prior to your dental procedure.   MAKE SURE YOU:  . Understand these instructions.  . Get help right away if you are not doing well or get worse.    Thank you for letting us be a part of your medical care team.  It is a privilege we respect greatly.  We hope these instructions will help you stay on track for a fast and full recovery!

## 2020-03-08 NOTE — Anesthesia Preprocedure Evaluation (Addendum)
Anesthesia Evaluation  Patient identified by MRN, date of birth, ID band Patient awake    Reviewed: Allergy & Precautions, NPO status , Patient's Chart, lab work & pertinent test results  Airway Mallampati: III  TM Distance: >3 FB Neck ROM: Full    Dental  (+) Teeth Intact, Dental Advisory Given   Pulmonary neg pulmonary ROS, former smoker,    breath sounds clear to auscultation       Cardiovascular hypertension, Pt. on medications  Rhythm:Regular Rate:Normal     Neuro/Psych negative psych ROS   GI/Hepatic negative GI ROS, Neg liver ROS,   Endo/Other  diabetes, Type 2, Oral Hypoglycemic AgentsHypothyroidism   Renal/GU negative Renal ROS     Musculoskeletal  (+) Arthritis ,   Abdominal (+) + obese,   Peds  Hematology negative hematology ROS (+)   Anesthesia Other Findings   Reproductive/Obstetrics                            Lab Results  Component Value Date   WBC 7.6 03/04/2020   HGB 13.0 03/04/2020   HCT 39.0 03/04/2020   MCV 91.8 03/04/2020   PLT 289 03/04/2020   No results found for: INR, PROTIME   Anesthesia Physical Anesthesia Plan  ASA: II  Anesthesia Plan: Spinal   Post-op Pain Management:  Regional for Post-op pain   Induction: Intravenous  PONV Risk Score and Plan: 3 and Ondansetron, Dexamethasone, Treatment may vary due to age or medical condition, Propofol infusion and Midazolam  Airway Management Planned: Natural Airway and Simple Face Mask  Additional Equipment: None  Intra-op Plan:   Post-operative Plan:   Informed Consent: I have reviewed the patients History and Physical, chart, labs and discussed the procedure including the risks, benefits and alternatives for the proposed anesthesia with the patient or authorized representative who has indicated his/her understanding and acceptance.       Plan Discussed with: CRNA  Anesthesia Plan Comments:         Anesthesia Quick Evaluation

## 2020-03-09 ENCOUNTER — Encounter (HOSPITAL_COMMUNITY): Payer: Self-pay | Admitting: Orthopaedic Surgery

## 2020-03-09 LAB — CBC
HCT: 32.1 % — ABNORMAL LOW (ref 36.0–46.0)
Hemoglobin: 10.5 g/dL — ABNORMAL LOW (ref 12.0–15.0)
MCH: 30.7 pg (ref 26.0–34.0)
MCHC: 32.7 g/dL (ref 30.0–36.0)
MCV: 93.9 fL (ref 80.0–100.0)
Platelets: 249 10*3/uL (ref 150–400)
RBC: 3.42 MIL/uL — ABNORMAL LOW (ref 3.87–5.11)
RDW: 12.6 % (ref 11.5–15.5)
WBC: 8.1 10*3/uL (ref 4.0–10.5)
nRBC: 0 % (ref 0.0–0.2)

## 2020-03-09 LAB — BASIC METABOLIC PANEL
Anion gap: 7 (ref 5–15)
BUN: 20 mg/dL (ref 8–23)
CO2: 25 mmol/L (ref 22–32)
Calcium: 8.6 mg/dL — ABNORMAL LOW (ref 8.9–10.3)
Chloride: 105 mmol/L (ref 98–111)
Creatinine, Ser: 0.75 mg/dL (ref 0.44–1.00)
GFR calc Af Amer: >60 mL/min (ref >60)
GFR calc non Af Amer: >60 mL/min (ref >60)
Glucose, Bld: 147 mg/dL — ABNORMAL HIGH (ref 70–99)
Potassium: 3.9 mmol/L (ref 3.5–5.1)
Sodium: 137 mmol/L (ref 135–145)

## 2020-03-09 LAB — GLUCOSE, CAPILLARY
Glucose-Capillary: 107 mg/dL — ABNORMAL HIGH (ref 70–99)
Glucose-Capillary: 118 mg/dL — ABNORMAL HIGH (ref 70–99)
Glucose-Capillary: 108 mg/dL — ABNORMAL HIGH (ref 70–99)

## 2020-03-09 LAB — SARS CORONAVIRUS 2 (TAT 6-24 HRS): SARS Coronavirus 2: NEGATIVE

## 2020-03-09 NOTE — Progress Notes (Signed)
PHYSICAL THERAPY   PM session Pt in bed in CPM sleeping.  Did not disturb.  Notified RN.  Pt plans to D/C to SNF Monday.  Rica Koyanagi  PTA Acute  Rehabilitation Services Pager      [phone] Office      [phone]

## 2020-03-09 NOTE — Progress Notes (Signed)
Physical Therapy Treatment Patient Details Name: Melissa [name] MRN: [id number] DOB: December 24, 1950 Today's Date: 03/09/2020    History of Present Illness Patient is 69 y.o. female s/p Rt TKA on 03/08/20 with PMH significant for hypothyroidism, HTN, OA.     PT Comments    POD # 1 am session Assisted OOB to amb in hallway.  General bed mobility comments: demonstarted and uinstructed on use of belt to self assist LE.  General transfer comment: cues for hand placement with RW, assist for power up and to steady.  General Gait Details: 25% VC's on proper walker to self distance and safety with turns.  Then returned to room to perform some TE's following HEP handout.  Instructed on proper tech, freq as well as use of ICE.   Pt plans to D/C to [facility] Monday  Follow Up Recommendations  Follow surgeon's recommendation for DC plan and follow-up therapies;SNF     Equipment Recommendations  Rolling walker with 5" wheels    Recommendations for Other Services       Precautions / Restrictions Precautions Precautions: Fall Precaution Comments: instructed no pillow under knee Restrictions Weight Bearing Restrictions: No RLE Weight Bearing: Weight bearing as tolerated Other Position/Activity Restrictions: WBAT    Mobility  Bed Mobility Overal bed mobility: Needs Assistance Bed Mobility: Supine to Sit           General bed mobility comments: demonstarted and uinstructed on use of belt to self assist LE  Transfers Overall transfer level: Needs assistance Equipment used: Rolling walker (2 wheeled) Transfers: Sit to/from [facility] Sit to Stand: Min guard;Min assist Stand pivot transfers: Min assist       General transfer comment: cues for hand placement with RW, assist for power up and to steady.   Ambulation/Gait Ambulation/Gait assistance: Min assist Gait Distance (Feet): 45 Feet Assistive device: Rolling walker (2 wheeled) Gait Pattern/deviations: Step-to  pattern;Decreased step length - left;Decreased stance time - right;Decreased stride length;Decreased weight shift to right;Antalgic Gait velocity: decreased   General Gait Details: 25% VC's on proper walker to self distance and safety with turns   Stairs             Wheelchair Mobility    Modified Rankin (Stroke Patients Only)       Balance                                            Cognition Arousal/Alertness: Awake/alert Behavior During Therapy: WFL for tasks assessed/performed Overall Cognitive Status: Within Functional Limits for tasks assessed                                        Exercises   Total Knee Replacement TE's following HEP handout 10 reps B LE ankle pumps 05 reps towel squeezes 05 reps knee presses 05 reps heel slides  05 reps SAQ's 05 reps SLR's 05 reps ABD Educated on use of gait belt to assist with TE's Followed by ICE     General Comments        Pertinent Vitals/Pain Pain Assessment: 0-10 Pain Score: 3  Pain Location: Rt knee Pain Descriptors / Indicators: Aching;Discomfort;Sore Pain Intervention(s): Monitored during session;Repositioned;Ice applied;Premedicated before session    Home Living  Prior Function            PT Goals (current goals can now be found in the care plan section) Progress towards PT goals: Progressing toward goals    Frequency    7X/week      PT Plan Current plan remains appropriate    Co-evaluation              AM-PAC PT "6 Clicks" Mobility   Outcome Measure  Help needed turning from your back to your side while in a flat bed without using bedrails?: A Little Help needed moving from lying on your back to sitting on the side of a flat bed without using bedrails?: A Little Help needed moving to and from a bed to a chair (including a wheelchair)?: A Little Help needed standing up from a chair using your arms (e.g., wheelchair  or bedside chair)?: A Little Help needed to walk in hospital room?: A Little Help needed climbing 3-5 steps with a railing? : A Little 6 Click Score: 18    End of Session Equipment Utilized During Treatment: Gait belt Activity Tolerance: Patient tolerated treatment well Patient left: in chair;with call bell/phone within reach;with chair alarm set Nurse Communication: Mobility status PT Visit Diagnosis: Muscle weakness (generalized) (M62.81);Difficulty in walking, not elsewhere classified (R26.2)     Time: [id number] PT Time Calculation (min) (ACUTE ONLY): 25 min  Charges:  $Gait Training: 8-22 mins $Therapeutic Exercise: 8-22 mins                     {Saige Busby  PTA Acute  Rehabilitation Services Pager      [phone] Office      [phone]

## 2020-03-09 NOTE — NC FL2 (Addendum)
[place] LEVEL OF CARE SCREENING TOOL     IDENTIFICATION  Patient Name: Melissa [name] Birthdate: 1951-08-29 Sex: female Admission Date (Current Location): 03/08/2020  [facility] and [place] Number:  Herbalist and Address:  [facility],  [place], [place]      Provider Number: [id number]  Attending Physician Name and Address:  Mcarthur Rossetti  Relative Name and Phone Number:     [place], Lamy [id number]  [phone]       Current Level of Care: Hospital Recommended Level of Care: [facility] Prior Approval Number:    Date Approved/Denied:   PASRR Number:    [id number]  Discharge Plan: SNF    Current Diagnoses: Patient Active Problem List   Diagnosis Date Noted  . Status post total right knee replacement 03/08/2020  . Pain in right foot 07/05/2017  . Chronic pain of right knee 07/05/2017  . Chronic pain of left knee 07/05/2017  . Unilateral primary osteoarthritis, left knee 07/05/2017  . Unilateral primary osteoarthritis, right knee 07/05/2017  . 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, right 04/14/2017    Orientation RESPIRATION BLADDER Height & Weight     Self, Time, Situation, Place  Normal Continent Weight: 209 lb 8 oz (95 kg) Height:  5\' 4"  (162.6 cm)  BEHAVIORAL SYMPTOMS/MOOD NEUROLOGICAL BOWEL NUTRITION STATUS      Continent Diet(Carb Modified.)  AMBULATORY STATUS COMMUNICATION OF NEEDS Skin   Extensive Assist Verbally Surgical wounds(Left Knee)                       Personal Care Assistance Level of Assistance  Bathing, Feeding, Dressing Bathing Assistance: Limited assistance Feeding assistance: Independent Dressing Assistance: Limited assistance     Functional Limitations Info  Sight, Hearing, Speech Sight Info: Impaired(Wears Glasses) Hearing Info: Adequate Speech Info: Adequate    SPECIAL CARE FACTORS FREQUENCY  PT (By  licensed PT), OT (By licensed OT)     PT Frequency: 5x/week OT Frequency: 5x/week            Contractures Contractures Info: Not present    Additional Factors Info  Code Status, Allergies Code Status Info: Fullcode Allergies Info: No known Allergies           Current Medications (03/09/2020):  This is the current hospital active medication list Current Facility-Administered Medications  Medication Dose Route Frequency Provider Last Rate Last Admin  . 0.9 %  sodium chloride infusion   Intravenous Continuous Mcarthur Rossetti, MD 75 mL/hr at 03/08/20 1616 New Bag at 03/08/20 1616  . acetaminophen (TYLENOL) tablet 325-650 mg  325-650 mg Oral Q6H PRN Mcarthur Rossetti, MD      . alum & mag hydroxide-simeth (MAALOX/MYLANTA) 200-200-20 MG/5ML suspension 30 mL  30 mL Oral Q4H PRN Mcarthur Rossetti, MD      . atorvastatin (LIPITOR) tablet 40 mg  40 mg Oral Daily Mcarthur Rossetti, MD   40 mg at 03/09/20 0916  . cholecalciferol (VITAMIN D3) tablet 5,000 Units  5,000 Units Oral Daily Mcarthur Rossetti, MD   5,000 Units at 03/09/20 [phone]  . diphenhydrAMINE (BENADRYL) 12.5 MG/5ML elixir 12.5-25 mg  12.5-25 mg Oral Q4H PRN Mcarthur Rossetti, MD      . docusate sodium (COLACE) capsule 100 mg  100 mg Oral BID Mcarthur Rossetti, MD   100 mg at 03/09/20 0915  . gabapentin (NEURONTIN) capsule 100 mg  100 mg  Oral TID Mcarthur Rossetti, MD   100 mg at 03/09/20 0915  . lisinopril (ZESTRIL) tablet 20 mg  20 mg Oral Daily Mcarthur Rossetti, MD   20 mg at 03/09/20 1114   And  . hydrochlorothiazide (HYDRODIURIL) tablet 25 mg  25 mg Oral Daily Mcarthur Rossetti, MD   25 mg at 03/09/20 1113  . HYDROmorphone (DILAUDID) injection 0.5-1 mg  0.5-1 mg Intravenous Q4H PRN Mcarthur Rossetti, MD      . levothyroxine (SYNTHROID) tablet 75 mcg  75 mcg Oral QAC breakfast Mcarthur Rossetti, MD   75 mcg at 03/09/20 0554  . magnesium oxide (MAG-OX)  tablet 400 mg  400 mg Oral Daily Mcarthur Rossetti, MD   400 mg at 03/08/20 1624  . menthol-cetylpyridinium (CEPACOL) lozenge 3 mg  1 lozenge Oral PRN Mcarthur Rossetti, MD       Or  . phenol (CHLORASEPTIC) mouth spray 1 spray  1 spray Mouth/Throat PRN Mcarthur Rossetti, MD      . metFORMIN (GLUCOPHAGE-XR) 24 hr tablet 1,500 mg  1,500 mg Oral Q supper Mcarthur Rossetti, MD      . methocarbamol (ROBAXIN) tablet 500 mg  500 mg Oral Q6H PRN Mcarthur Rossetti, MD   500 mg at 03/09/20 1455   Or  . methocarbamol (ROBAXIN) 500 mg in dextrose 5 % 50 mL IVPB  500 mg Intravenous Q6H PRN Mcarthur Rossetti, MD      . metoCLOPramide (REGLAN) tablet 5-10 mg  5-10 mg Oral Q8H PRN Mcarthur Rossetti, MD       Or  . metoCLOPramide (REGLAN) injection 5-10 mg  5-10 mg Intravenous Q8H PRN Mcarthur Rossetti, MD      . ondansetron [facility]) tablet 4 mg  4 mg Oral Q6H PRN Mcarthur Rossetti, MD       Or  . ondansetron [facility]) injection 4 mg  4 mg Intravenous Q6H PRN Mcarthur Rossetti, MD      . oxyCODONE (Oxy IR/ROXICODONE) immediate release tablet 10-15 mg  10-15 mg Oral Q4H PRN Mcarthur Rossetti, MD   10 mg at 03/09/20 1455  . oxyCODONE (Oxy IR/ROXICODONE) immediate release tablet 5-10 mg  5-10 mg Oral Q4H PRN Mcarthur Rossetti, MD   10 mg at 03/08/20 1830  . pantoprazole (PROTONIX) EC tablet 40 mg  40 mg Oral Daily Mcarthur Rossetti, MD   40 mg at 03/09/20 0915  . polyethylene glycol (MIRALAX / GLYCOLAX) packet 17 g  17 g Oral Daily PRN Mcarthur Rossetti, MD      . rivaroxaban [facility]) tablet 10 mg  10 mg Oral Q breakfast Mcarthur Rossetti, MD   10 mg at 03/09/20 0916  . zinc sulfate capsule 220 mg  220 mg Oral Daily Mcarthur Rossetti, MD   220 mg at 03/09/20 0915     Discharge Medications: Please see discharge summary for a list of discharge medications.  Relevant Imaging Results:  Relevant Lab  Results:   Additional Information ssn: [id number]  Lia Hopping, LCSW

## 2020-03-09 NOTE — Progress Notes (Signed)
Orthopedic Tech Progress Note Patient Details:  Melissa [name] 12/01/1950 [id number]  CPM Right Knee CPM Right Knee: Off Right Knee Flexion (Degrees): 60 Right Knee Extension (Degrees): 0  Post Interventions Patient Tolerated: Well  [name] 03/09/2020, 3:38 PM

## 2020-03-09 NOTE — Plan of Care (Signed)
Problem: Education: Goal: Knowledge of the prescribed therapeutic regimen will improve 03/09/2020 1105 by Deetta Perla, RN Outcome: Progressing 03/09/2020 1105 by Deetta Perla, RN Outcome: Progressing Goal: Individualized Educational Video(s) 03/09/2020 1105 by Deetta Perla, RN Outcome: Progressing 03/09/2020 1105 by Deetta Perla, RN Outcome: Progressing   Problem: Activity: Goal: Ability to avoid complications of mobility impairment will improve 03/09/2020 1105 by Jamarquis Crull, Helane Gunther, RN Outcome: Progressing 03/09/2020 1105 by Deetta Perla, RN Outcome: Progressing Goal: Range of joint motion will improve 03/09/2020 1105 by Antawan Mchugh, Helane Gunther, RN Outcome: Progressing 03/09/2020 1105 by Deetta Perla, RN Outcome: Progressing   Problem: Clinical Measurements: Goal: Postoperative complications will be avoided or minimized 03/09/2020 1105 by Deetta Perla, RN Outcome: Progressing 03/09/2020 1105 by Deetta Perla, RN Outcome: Progressing   Problem: Pain Management: Goal: Pain level will decrease with appropriate interventions 03/09/2020 1105 by Deetta Perla, RN Outcome: Progressing 03/09/2020 1105 by Deetta Perla, RN Outcome: Progressing   Problem: Skin Integrity: Goal: Will show signs of wound healing 03/09/2020 1105 by Deetta Perla, RN Outcome: Progressing 03/09/2020 1105 by Deetta Perla, RN Outcome: Progressing   Problem: Education: Goal: Knowledge of General Education information will improve Description: Including pain rating scale, medication(s)/side effects and non-pharmacologic comfort measures 03/09/2020 1105 by Deetta Perla, RN Outcome: Progressing 03/09/2020 1105 by Deetta Perla, RN Outcome: Progressing   Problem: Health Behavior/Discharge Planning: Goal: Ability to manage health-related needs will improve 03/09/2020 1105 by Delvon Chipps, Helane Gunther, RN Outcome: Progressing 03/09/2020 1105 by Deetta Perla,  RN Outcome: Progressing   Problem: Clinical Measurements: Goal: Ability to maintain clinical measurements within normal limits will improve 03/09/2020 1105 by Deetta Perla, RN Outcome: Progressing 03/09/2020 1105 by Deetta Perla, RN Outcome: Progressing Goal: Will remain free from infection 03/09/2020 1105 by Deetta Perla, RN Outcome: Progressing 03/09/2020 1105 by Deetta Perla, RN Outcome: Progressing Goal: Diagnostic test results will improve 03/09/2020 1105 by Deetta Perla, RN Outcome: Progressing 03/09/2020 1105 by Deetta Perla, RN Outcome: Progressing Goal: Respiratory complications will improve 03/09/2020 1105 by Deetta Perla, RN Outcome: Progressing 03/09/2020 1105 by Deetta Perla, RN Outcome: Progressing Goal: Cardiovascular complication will be avoided 03/09/2020 1105 by Deetta Perla, RN Outcome: Progressing 03/09/2020 1105 by Deetta Perla, RN Outcome: Progressing   Problem: Activity: Goal: Risk for activity intolerance will decrease 03/09/2020 1105 by Deetta Perla, RN Outcome: Progressing 03/09/2020 1105 by Deetta Perla, RN Outcome: Progressing   Problem: Nutrition: Goal: Adequate nutrition will be maintained 03/09/2020 1105 by Deetta Perla, RN Outcome: Progressing 03/09/2020 1105 by Deetta Perla, RN Outcome: Progressing   Problem: Coping: Goal: Level of anxiety will decrease 03/09/2020 1105 by Deetta Perla, RN Outcome: Progressing 03/09/2020 1105 by Deetta Perla, RN Outcome: Progressing   Problem: Elimination: Goal: Will not experience complications related to bowel motility 03/09/2020 1105 by Deetta Perla, RN Outcome: Progressing 03/09/2020 1105 by Deetta Perla, RN Outcome: Progressing Goal: Will not experience complications related to urinary retention 03/09/2020 1105 by Deetta Perla, RN Outcome: Progressing 03/09/2020 1105 by Deetta Perla, RN Outcome:  Progressing   Problem: Pain Managment: Goal: General experience of comfort will improve 03/09/2020 1105 by Deetta Perla, RN Outcome: Progressing 03/09/2020 1105 by Deetta Perla, RN Outcome: Progressing   Problem: Safety: Goal: Ability to remain free from injury will improve 03/09/2020 1105 by Deetta Perla, RN Outcome: Progressing 03/09/2020  1105 by Halynn Reitano, Helane Gunther, RN Outcome: Progressing   Problem: Skin Integrity: Goal: Risk for impaired skin integrity will decrease 03/09/2020 1105 by Deetta Perla, RN Outcome: Progressing 03/09/2020 1105 by Deetta Perla, RN Outcome: Progressing

## 2020-03-09 NOTE — TOC Initial Note (Signed)
Transition of Care [facility]) - Initial/Assessment Note    Patient Details  Name: Melissa [name] MRN: [id number] Date of Birth: 27-Jul-1951  [facility]) CM/SW Contact:    Lia Hopping, [place] Phone Number: 03/09/2020, 3:04 PM  Clinical Narrative:         Patient admitted for a right total knee arthroplasty and left knee steroid injection (Right)      CSW discussed plan for SNF rehab with the patient. She is scheduled to admit to [facility] pending [employer]. CSW confirmed with SNF admission coordinator Whitney.  FL2 completed.   Expected Discharge Plan: [place] Barriers to Discharge: Continued Medical Work up, Ship broker   Patient Goals and CMS Choice Patient states their goals for this hospitalization and ongoing recovery are:: "discharge to [place] for rehab" CMS Medicare.gov Compare Post Acute Care list provided to:: (Patient prearranged SNF her own SNF stay prior to arrival.) Choice offered to / list presented to : NA  Expected Discharge Plan and Services Expected Discharge Plan: [place] In-house Referral: NA Discharge Planning Services: NA Post Acute Care Choice: [place] Living arrangements for the past 2 months: Single Family Home                 DME Arranged: N/A DME Agency: NA       HH Arranged: NA          Prior Living Arrangements/Services Living arrangements for the past 2 months: Single Family Home Lives with:: Self Patient language and need for interpreter reviewed:: No Do you feel safe going back to the place where you live?: Yes      Need for Family Participation in Patient Care: Yes (Comment) Care giver support system in place?: Yes (comment)   Criminal Activity/Legal Involvement Pertinent to Current Situation/Hospitalization: No - Comment as needed  Activities of Daily Living Home Assistive Devices/Equipment: Eyeglasses ADL Screening (condition at time of  admission) Patient's cognitive ability adequate to safely complete daily activities?: Yes Is the patient deaf or have difficulty hearing?: No Does the patient have difficulty seeing, even when wearing glasses/contacts?: No Does the patient have difficulty concentrating, remembering, or making decisions?: No Patient able to express need for assistance with ADLs?: Yes Does the patient have difficulty dressing or bathing?: No Independently performs ADLs?: Yes (appropriate for developmental age) Does the patient have difficulty walking or climbing stairs?: Yes Weakness of Legs: None Weakness of Arms/Hands: None  Permission Sought/Granted Permission sought to share information with : Facility Sport and exercise psychologist, Case Optician, dispensing granted to share information with : Yes, Verbal Permission Granted     Permission granted to share info w AGENCY: [place]        Emotional Assessment Appearance:: Appears stated age Attitude/Demeanor/Rapport: Engaged Affect (typically observed): Appropriate Orientation: : Oriented to Self, Oriented to Place, Oriented to  Time, Oriented to Situation Alcohol / Substance Use: Not Applicable Psych Involvement: No (comment)  Admission diagnosis:  Status post total right knee replacement [Z96.651] Patient Active Problem List   Diagnosis Date Noted  . Status post total right knee replacement 03/08/2020  . Pain in right foot 07/05/2017  . Chronic pain of right knee 07/05/2017  . Chronic pain of left knee 07/05/2017  . Unilateral primary osteoarthritis, left knee 07/05/2017  . Unilateral primary osteoarthritis, right knee 07/05/2017  . 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

## 2020-03-09 NOTE — Progress Notes (Signed)
Orthopedic Tech Progress Note Patient Details:  Melissa [name] 09/11/1951 [id number]  CPM Right Knee CPM Right Knee: On Right Knee Flexion (Degrees): 60 Right Knee Extension (Degrees): 0  Post Interventions Patient Tolerated: Well  [name] 03/09/2020, 1:48 PM

## 2020-03-09 NOTE — Progress Notes (Signed)
Subjective: 1 Day Post-Op Procedure(s) (LRB): RIGHT TOTAL KNEE ARTHROPLASTY AND LEFT KNEE STEROID INJECTION (Right) Patient reports pain as moderate.    Objective: Vital signs in last 24 hours: Temp:  [97.5 F (36.4 C)-98.6 F (37 C)] 98.4 F (36.9 C) (04/17 0958) Pulse Rate:  [77-103] 103 (04/17 0958) Resp:  [15-17] 17 (04/17 0958) BP: (121-148)/(66-84) 148/75 (04/17 0958) SpO2:  [97 %-100 %] 97 % (04/17 0958)  Intake/Output from previous day: 04/16 0701 - 04/17 0700 In: 3740.8 [P.O.:600; I.V.:2790.8; IV Piggyback:350] Out: [id number] [Urine:3000; Blood:50] Intake/Output this shift: Total I/O In: 491.7 [P.O.:240; I.V.:251.7] Out: -   Recent Labs    03/09/20 0259  HGB 10.5*   Recent Labs    03/09/20 0259  WBC 8.1  RBC 3.42*  HCT 32.1*  PLT 249   Recent Labs    03/09/20 0259  NA 137  K 3.9  CL 105  CO2 25  BUN 20  CREATININE 0.75  GLUCOSE 147*  CALCIUM 8.6*   No results for input(s): LABPT, INR in the last 72 hours.  Sensation intact distally Intact pulses distally Dorsiflexion/Plantar flexion intact Incision: dressing C/D/I No cellulitis present Compartment soft   Assessment/Plan: 1 Day Post-Op Procedure(s) (LRB): RIGHT TOTAL KNEE ARTHROPLASTY AND LEFT KNEE STEROID INJECTION (Right) Up with therapy Discharge to SNF Monday      Mcarthur Rossetti 03/09/2020, 12:15 PM

## 2020-03-10 LAB — GLUCOSE, CAPILLARY
Glucose-Capillary: 121 mg/dL — ABNORMAL HIGH (ref 70–99)
Glucose-Capillary: 117 mg/dL — ABNORMAL HIGH (ref 70–99)
Glucose-Capillary: 116 mg/dL — ABNORMAL HIGH (ref 70–99)
Glucose-Capillary: 106 mg/dL — ABNORMAL HIGH (ref 70–99)
Glucose-Capillary: 105 mg/dL — ABNORMAL HIGH (ref 70–99)

## 2020-03-10 NOTE — Progress Notes (Signed)
Physical Therapy Treatment Patient Details Name: Melissa [name] MRN: [id number] DOB: 12/09/1950 Today's Date: 03/10/2020    History of Present Illness Patient is 69 y.o. female s/p Rt TKA on 03/08/20 with PMH significant for hypothyroidism, HTN, OA.     PT Comments    Pt continues very motivated and progressing steadily with mobility.  Follow Up Recommendations  Follow surgeon's recommendation for DC plan and follow-up therapies;SNF     Equipment Recommendations  Rolling walker with 5" wheels    Recommendations for Other Services       Precautions / Restrictions Precautions Precautions: Fall Precaution Comments: instructed no pillow under knee Restrictions Weight Bearing Restrictions: No RLE Weight Bearing: Weight bearing as tolerated    Mobility  Bed Mobility Overal bed mobility: Needs Assistance Bed Mobility: Supine to Sit     Supine to sit: HOB elevated;Min assist     General bed mobility comments: demonstarted and uinstructed on use of belt to self assist LE  Transfers Overall transfer level: Needs assistance Equipment used: Rolling walker (2 wheeled) Transfers: Sit to/from Stand Sit to Stand: Min guard         General transfer comment: cues for hand placement with RW, steady assist  Ambulation/Gait Ambulation/Gait assistance: Min assist;Min guard Gait Distance (Feet): 60 Feet Assistive device: Rolling walker (2 wheeled) Gait Pattern/deviations: Step-to pattern;Decreased step length - left;Decreased stance time - right;Decreased stride length;Decreased weight shift to right;Antalgic Gait velocity: decreased   General Gait Details: cues for posture, position from RW and initial sequence   Stairs             Wheelchair Mobility    Modified Rankin (Stroke Patients Only)       Balance Overall balance assessment: Needs assistance Sitting-balance support: No upper extremity supported;Feet supported Sitting balance-Leahy Scale: Good      Standing balance support: Bilateral upper extremity supported;During functional activity Standing balance-Leahy Scale: Fair                              Cognition Arousal/Alertness: Awake/alert Behavior During Therapy: WFL for tasks assessed/performed Overall Cognitive Status: Within Functional Limits for tasks assessed                                        Exercises Total Joint Exercises Ankle Circles/Pumps: AROM;Both;20 reps;Seated Quad Sets: AROM;Right;10 reps;Supine Heel Slides: AROM;Right;Seated;15 reps Straight Leg Raises: AROM;20 reps;Left;Supine Long Arc Quad: AAROM;AROM;Left;10 reps;Seated    General Comments        Pertinent Vitals/Pain Pain Assessment: 0-10 Pain Score: 4  Pain Location: Rt knee Pain Descriptors / Indicators: Aching;Discomfort;Sore Pain Intervention(s): Limited activity within patient's tolerance;Monitored during session;Premedicated before session;Ice applied    Home Living                      Prior Function            PT Goals (current goals can now be found in the care plan section) Acute Rehab PT Goals Patient Stated Goal: be able to walk again with out pain PT Goal Formulation: With patient Time For Goal Achievement: 03/15/20 Potential to Achieve Goals: Good Progress towards PT goals: Progressing toward goals    Frequency    7X/week      PT Plan Current plan remains appropriate    Co-evaluation  AM-PAC PT "6 Clicks" Mobility   Outcome Measure  Help needed turning from your back to your side while in a flat bed without using bedrails?: A Little Help needed moving from lying on your back to sitting on the side of a flat bed without using bedrails?: A Little Help needed moving to and from a bed to a chair (including a wheelchair)?: A Little Help needed standing up from a chair using your arms (e.g., wheelchair or bedside chair)?: A Little Help needed to walk in  hospital room?: A Little Help needed climbing 3-5 steps with a railing? : A Little 6 Click Score: 18    End of Session Equipment Utilized During Treatment: Gait belt Activity Tolerance: Patient tolerated treatment well Patient left: in chair;with call bell/phone within reach;with chair alarm set Nurse Communication: Mobility status PT Visit Diagnosis: Muscle weakness (generalized) (M62.81);Difficulty in walking, not elsewhere classified (R26.2)     Time: [id number] PT Time Calculation (min) (ACUTE ONLY): 30 min  Charges:  $Gait Training: 8-22 mins $Therapeutic Exercise: 8-22 mins                     Debe Coder PT Acute Rehabilitation Services Pager [phone] Office [phone]    [facility] 03/10/2020, 12:27 PM

## 2020-03-10 NOTE — Progress Notes (Signed)
Physical Therapy Treatment Patient Details Name: Melissa [name] MRN: [id number] DOB: [birth year] Today's Date: 03/10/2020    History of Present Illness Patient is 69 y.o. female s/p Rt TKA on 03/08/20 with PMH significant for hypothyroidism, HTN, OA.     PT Comments    Pt continues motivated and progressing with mobility.  Pt eager for dc to rehab setting tomorrow.   Follow Up Recommendations  Follow surgeon's recommendation for DC plan and follow-up therapies;SNF     Equipment Recommendations  Rolling walker with 5" wheels    Recommendations for Other Services       Precautions / Restrictions Precautions Precautions: Fall Precaution Comments: instructed no pillow under knee Restrictions Weight Bearing Restrictions: No RLE Weight Bearing: Weight bearing as tolerated Other Position/Activity Restrictions: WBAT    Mobility  Bed Mobility Overal bed mobility: Needs Assistance Bed Mobility: Sit to Supine       Sit to supine: Min guard   General bed mobility comments: cues for sequence and use of L LE to self assist  Transfers Overall transfer level: Needs assistance Equipment used: Rolling walker (2 wheeled) Transfers: Sit to/from Stand Sit to Stand: Min guard         General transfer comment: cues for hand placement with RW, steady assist  Ambulation/Gait Ambulation/Gait assistance: Min guard Gait Distance (Feet): 68 Feet Assistive device: Rolling walker (2 wheeled) Gait Pattern/deviations: Step-to pattern;Decreased step length - left;Decreased stance time - right;Decreased stride length;Decreased weight shift to right;Antalgic Gait velocity: decreased   General Gait Details: cues for posture, position from RW and initial sequence   Stairs             Wheelchair Mobility    Modified Rankin (Stroke Patients Only)       Balance Overall balance assessment: Needs assistance Sitting-balance support: No upper extremity supported;Feet  supported Sitting balance-Leahy Scale: Good     Standing balance support: Bilateral upper extremity supported;During functional activity Standing balance-Leahy Scale: Fair                              Cognition Arousal/Alertness: Awake/alert Behavior During Therapy: WFL for tasks assessed/performed Overall Cognitive Status: Within Functional Limits for tasks assessed                                        Exercises Total Joint Exercises Ankle Circles/Pumps: AROM;Both;20 reps;Seated Quad Sets: AROM;Right;10 reps;Supine Heel Slides: AROM;Right;Seated;15 reps Straight Leg Raises: AROM;20 reps;Left;Supine Long Arc Quad: AAROM;AROM;Left;10 reps;Seated    General Comments        Pertinent Vitals/Pain Pain Assessment: 0-10 Pain Score: 4  Pain Location: Rt knee Pain Descriptors / Indicators: Aching;Discomfort;Sore Pain Intervention(s): Limited activity within patient's tolerance;Monitored during session;Premedicated before session;Ice applied    Home Living                      Prior Function            PT Goals (current goals can now be found in the care plan section) Acute Rehab PT Goals Patient Stated Goal: be able to walk again with out pain PT Goal Formulation: With patient Time For Goal Achievement: 03/15/20 Potential to Achieve Goals: Good Progress towards PT goals: Progressing toward goals    Frequency    7X/week      PT Plan Current plan remains  appropriate    Co-evaluation              AM-PAC PT "6 Clicks" Mobility   Outcome Measure  Help needed turning from your back to your side while in a flat bed without using bedrails?: A Little Help needed moving from lying on your back to sitting on the side of a flat bed without using bedrails?: A Little Help needed moving to and from a bed to a chair (including a wheelchair)?: A Little Help needed standing up from a chair using your arms (e.g., wheelchair or  bedside chair)?: A Little Help needed to walk in hospital room?: A Little Help needed climbing 3-5 steps with a railing? : A Little 6 Click Score: 18    End of Session Equipment Utilized During Treatment: Gait belt Activity Tolerance: Patient tolerated treatment well Patient left: in bed;with call bell/phone within reach;with bed alarm set Nurse Communication: Mobility status PT Visit Diagnosis: Muscle weakness (generalized) (M62.81);Difficulty in walking, not elsewhere classified (R26.2)     Time: [id number] PT Time Calculation (min) (ACUTE ONLY): 30 min  Charges:  $Gait Training: 8-22 mins $Therapeutic Exercise: 8-22 mins                     [place] Pager [phone] Office [phone]    Crawford Tamura 03/10/2020, 2:39 PM

## 2020-03-10 NOTE — Progress Notes (Signed)
Orthopedic Tech Progress Note Patient Details:  Melissa [name] 1951-02-22 [id number]  CPM Right Knee CPM Right Knee: On Right Knee Flexion (Degrees): 65 Right Knee Extension (Degrees): 0  Post Interventions Patient Tolerated: Well  [name] 03/10/2020, 1:50 PM

## 2020-03-10 NOTE — Progress Notes (Signed)
Orthopedic Tech Progress Note Patient Details:  Melissa [name] 10-May-1951 [id number]  CPM Right Knee CPM Right Knee: Off Right Knee Flexion (Degrees): 65 Right Knee Extension (Degrees): 0  Post Interventions Patient Tolerated: Well  [name] 03/10/2020, 5:46 PM

## 2020-03-10 NOTE — Progress Notes (Signed)
   Subjective: 2 Days Post-Op Procedure(s) (LRB): RIGHT TOTAL KNEE ARTHROPLASTY AND LEFT KNEE STEROID INJECTION (Right) Patient reports pain as mild.    Objective: Vital signs in last 24 hours: Temp:  [97.5 F (36.4 C)-99.5 F (37.5 C)] 99.5 F (37.5 C) (04/18 0553) Pulse Rate:  [77-103] 77 (04/18 0553) Resp:  [16-17] 16 (04/18 0553) BP: (113-148)/(54-75) 113/65 (04/18 0553) SpO2:  [94 %-100 %] 96 % (04/18 0553)  Intake/Output from previous day: 04/17 0701 - 04/18 0700 In: 791.7 [P.O.:540; I.V.:251.7] Out: 800 [Urine:800] Intake/Output this shift: No intake/output data recorded.  Recent Labs    03/09/20 0259  HGB 10.5*   Recent Labs    03/09/20 0259  WBC 8.1  RBC 3.42*  HCT 32.1*  PLT 249   Recent Labs    03/09/20 0259  NA 137  K 3.9  CL 105  CO2 25  BUN 20  CREATININE 0.75  GLUCOSE 147*  CALCIUM 8.6*   No results for input(s): LABPT, INR in the last 72 hours.  Neurologically intact No results found.  Assessment/Plan: 2 Days Post-Op Procedure(s) (LRB): RIGHT TOTAL KNEE ARTHROPLASTY AND LEFT KNEE STEROID INJECTION (Right) Up with therapy, [place] Monday. Contracting quad well. Discussed excercises she can do on her own to speed process.   Marybelle Killings 03/10/2020, 8:59 AM

## 2020-03-11 ENCOUNTER — Encounter: Payer: Self-pay | Admitting: *Deleted

## 2020-03-11 LAB — GLUCOSE, CAPILLARY
Glucose-Capillary: 96 mg/dL (ref 70–99)
Glucose-Capillary: 115 mg/dL — ABNORMAL HIGH (ref 70–99)

## 2020-03-11 MED ORDER — METHOCARBAMOL 500 MG PO TABS: 500.0000 mg | ORAL_TABLET | Freq: Four times a day (QID) | ORAL | 0 refills | Status: DC | PRN

## 2020-03-11 MED ORDER — RIVAROXABAN 10 MG PO TABS: 10.0000 mg | ORAL_TABLET | Freq: Every day | ORAL | 0 refills | Status: DC

## 2020-03-11 MED ORDER — OXYCODONE HCL 5 MG PO TABS: 5.0000 mg | ORAL_TABLET | ORAL | 0 refills | Status: DC | PRN

## 2020-03-11 NOTE — Progress Notes (Signed)
Physical Therapy Treatment Patient Details Name: Melissa [name] MRN: [id number] DOB: Jan 12, 1951 Today's Date: 03/11/2020    History of Present Illness Patient is 69 y.o. female s/p Rt TKA on 03/08/20 with PMH significant for hypothyroidism, HTN, OA.     PT Comments    POD #3 Pt progressing slowly.  General transfer comment: cues for hand placement with RW, steady assist, increased time.General Gait Details: cues for posture, position from RW and initial sequence.  Mild gait instability.Then returned to room to perform some TE's following HEP handout.  Instructed on proper tech, freq as well as use of ICE.   Pt will need ST Rehab at SNF prior to D/C to home.      Follow Up Recommendations  Follow surgeon's recommendation for DC plan and follow-up therapies;SNF     Equipment Recommendations  Rolling walker with 5" wheels    Recommendations for Other Services       Precautions / Restrictions Precautions Precautions: Fall Precaution Comments: instructed no pillow under knee Restrictions Weight Bearing Restrictions: No RLE Weight Bearing: Weight bearing as tolerated    Mobility  Bed Mobility               General bed mobility comments: pt OOB in recliner  Transfers Overall transfer level: Needs assistance Equipment used: Rolling walker (2 wheeled) Transfers: Sit to/from Stand Sit to Stand: Min guard Stand pivot transfers: Min guard;Min assist       General transfer comment: cues for hand placement with RW, steady assist, increased time  Ambulation/Gait Ambulation/Gait assistance: Min guard;Supervision Gait Distance (Feet): 47 Feet Assistive device: Rolling walker (2 wheeled) Gait Pattern/deviations: Step-to pattern;Decreased step length - left;Decreased stance time - right;Decreased stride length;Decreased weight shift to right;Antalgic Gait velocity: decreased   General Gait Details: cues for posture, position from RW and initial sequence.  Mild gait  instability.   Stairs             Wheelchair Mobility    Modified Rankin (Stroke Patients Only)       Balance                                            Cognition Arousal/Alertness: Awake/alert Behavior During Therapy: WFL for tasks assessed/performed Overall Cognitive Status: Within Functional Limits for tasks assessed                                 General Comments: pt tearful, "having a moment".  Pt is wondering what time she is going to Rehab and worried/uncomfortable that her last bowel movement was Friday.      Exercises      General Comments        Pertinent Vitals/Pain Pain Assessment: 0-10 Pain Score: 3  Pain Location: Rt knee Pain Descriptors / Indicators: Aching;Discomfort;Sore;Tightness Pain Intervention(s): Monitored during session;Premedicated before session;Repositioned;Ice applied    Home Living                      Prior Function            PT Goals (current goals can now be found in the care plan section) Progress towards PT goals: Progressing toward goals    Frequency    7X/week      PT Plan Current plan remains appropriate  Co-evaluation              AM-PAC PT "6 Clicks" Mobility   Outcome Measure  Help needed turning from your back to your side while in a flat bed without using bedrails?: A Little Help needed moving from lying on your back to sitting on the side of a flat bed without using bedrails?: A Little Help needed moving to and from a bed to a chair (including a wheelchair)?: A Little Help needed standing up from a chair using your arms (e.g., wheelchair or bedside chair)?: A Little Help needed to walk in hospital room?: A Little Help needed climbing 3-5 steps with a railing? : A Little 6 Click Score: 18    End of Session Equipment Utilized During Treatment: Gait belt Activity Tolerance: Patient tolerated treatment well Patient left: in chair;with call  bell/phone within reach Nurse Communication: Mobility status;Other (comment)(pt requesting Maral[place]) PT Visit Diagnosis: Muscle weakness (generalized) (M62.81);Difficulty in walking, not elsewhere classified (R26.2)     Time: [id number] PT Time Calculation (min) (ACUTE ONLY): 26 min  Charges:  $Gait Training: 8-22 mins $Therapeutic Exercise: 8-22 mins                     Rica Koyanagi  PTA Acute  Rehabilitation Services Pager      [phone] Office      [phone]

## 2020-03-11 NOTE — Progress Notes (Signed)
Patient ID: Melissa [name], female   DOB: [birth year], 68 y.o.   MRN: [id number] Doing well overall.  Right knee stable.  Can be discharged to skilled nursing today.

## 2020-03-11 NOTE — Care Management Important Message (Signed)
Important Message  Patient Details IM Letter given to [place] Case Manager to present to the Patient Name: Melissa [name] MRN: [id number] Date of Birth: 1951/03/14   Medicare Important Message Given:  Yes     Kerin Salen 03/11/2020, 11:09 AM

## 2020-03-11 NOTE — Progress Notes (Signed)
Continue to await insurance auth for SNF and may not have until tomorrow.  Have alert patient and nursing to delay.  Vinetta Brach, LCSW

## 2020-03-11 NOTE — Discharge Summary (Signed)
Patient ID: Melissa [name] MRN: [id number] DOB/AGE: 1951-07-29 69 y.o.  Admit date: 03/08/2020 Discharge date: 03/11/2020  Admission Diagnoses:  Principal Problem:   Unilateral primary osteoarthritis, right knee Active Problems:   Status post total right knee replacement   Discharge Diagnoses:  Same  Past Medical History:  Diagnosis Date  . Arthritis    osteo arthritis  . Hypertension   . Hypothyroidism   . Pre-diabetes    Takes metformin  . Walking pneumonia     Surgeries: Procedure(s): RIGHT TOTAL KNEE ARTHROPLASTY AND LEFT KNEE STEROID INJECTION on 03/08/2020   Consultants:   Discharged Condition: Improved  Hospital Course: Melissa [name] is an 69 y.o. female who was admitted 03/08/2020 for operative treatment ofUnilateral primary osteoarthritis, right knee. Patient has severe unremitting pain that affects sleep, daily activities, and work/hobbies. After pre-op clearance the patient was taken to the operating room on 03/08/2020 and underwent  Procedure(s): RIGHT TOTAL KNEE ARTHROPLASTY AND LEFT KNEE STEROID INJECTION.    Patient was given perioperative antibiotics:  Anti-infectives (From admission, onward)   Start     Dose/Rate Route Frequency Ordered Stop   03/08/20 1600  ceFAZolin (ANCEF) IVPB 1 g/50 mL premix     1 g 100 mL/hr over 30 Minutes Intravenous Every 6 hours 03/08/20 1522 03/08/20 2330   03/08/20 0630  ceFAZolin (ANCEF) IVPB 2g/100 mL premix     2 g 200 mL/hr over 30 Minutes Intravenous On call to O.R. 03/08/20 0620 03/08/20 0830       Patient was given sequential compression devices, early ambulation, and chemoprophylaxis to prevent DVT.  Patient benefited maximally from hospital stay and there were no complications.    Recent vital signs:  Patient Vitals for the past 24 hrs:  BP Temp Temp src Pulse Resp SpO2  03/11/20 0534 118/67 99.3 F (37.4 C) Oral 89 18 94 %  03/10/20 2124 (!) 121/57 99.3 F (37.4 C) Oral 96 16 94 %  03/10/20 1407  122/64 98.4 F (36.9 C) Oral 93 18 95 %     Recent laboratory studies:  Recent Labs    03/09/20 0259  WBC 8.1  HGB 10.5*  HCT 32.1*  PLT 249  NA 137  K 3.9  CL 105  CO2 25  BUN 20  CREATININE 0.75  GLUCOSE 147*  CALCIUM 8.6*     Discharge Medications:   Allergies as of 03/11/2020   No Known Allergies     Medication List    STOP taking these medications   ibuprofen 200 MG tablet Commonly known as: ADVIL   Turmeric 500 MG Caps     TAKE these medications   atorvastatin 40 MG tablet Commonly known as: LIPITOR Take 40 mg by mouth daily.   Biotin 1000 MCG tablet Take 1,000 mcg by mouth daily.   CALCIUM-VITAMIN D PO Take 1 tablet by mouth daily.   ECHINACEA HERB PO Take 400 mg by mouth daily.   levothyroxine 75 MCG tablet Commonly known as: SYNTHROID Take 75 mcg by mouth daily before breakfast.   lisinopril-hydrochlorothiazide 20-25 MG tablet Commonly known as: ZESTORETIC Take 1 tablet by mouth daily.   LIVER SUPPORT SL Take 1 tablet by mouth daily. Liver Refresh   magnesium oxide 400 MG tablet Commonly known as: MAG-OX Take 400 mg by mouth daily.   metFORMIN 750 MG 24 hr tablet Commonly known as: GLUCOPHAGE-XR Take 1,500 mg by mouth every evening.   methocarbamol 500 MG tablet Commonly known as: ROBAXIN Take 1 tablet (500  mg total) by mouth every 6 (six) hours as needed for muscle spasms.   OSTEO BI-FLEX ADV TRIPLE ST PO Take 2 tablets by mouth daily.   oxyCODONE 5 MG immediate release tablet Commonly known as: Oxy IR/ROXICODONE Take 1-2 tablets (5-10 mg total) by mouth every 4 (four) hours as needed for moderate pain (pain score 4-6).   rivaroxaban 10 MG Tabs tablet Commonly known as: XARELTO Take 1 tablet (10 mg total) by mouth daily with breakfast. Take for the next 10 days only.   Vitamin D 125 MCG (5000 UT) Caps Take 5,000 Units by mouth daily.   zinc gluconate 50 MG tablet Take 50 mg by mouth daily.            Durable  Medical Equipment  (From admission, onward)         Start     Ordered   03/08/20 1523  DME 3 n 1  Once     03/08/20 1522   03/08/20 1523  DME Walker rolling  Once    Question Answer Comment  Walker: With 5 Inch Wheels   Patient needs a walker to treat with the following condition Status post total right knee replacement      03/08/20 1522          Diagnostic Studies: DG Knee Right Port  Result Date: 03/08/2020 CLINICAL DATA:  Postop for right knee surgery EXAM: PORTABLE RIGHT KNEE - 1-2 VIEW COMPARISON:  01/29/2020 FINDINGS: Total knee arthroplasty with well-seated prosthesis. No periprosthetic fracture or subluxation. There is expected postoperative gas and swelling. IMPRESSION: No unexpected finding after total knee arthroplasty. Electronically Signed   By: Monte Fantasia M.D.   On: 03/08/2020 11:24    Disposition: Discharge disposition: 03-Skilled [place] information for follow-up providers    Mcarthur Rossetti, MD Follow up in 2 week(s).   Specialty: Orthopedic Surgery Contact information: [place] [address] [phone]            Contact information for after-discharge care    Destination    [facility] .   Service: Skilled Nursing Contact information: [address] [place] [phone]                   Signed: Mcarthur Rossetti 03/11/2020, 7:27 AM

## 2020-03-11 NOTE — Progress Notes (Signed)
Patient woke up and realized her IV came off. IV was removed . Patient with no other complains, we will continue to monitor.

## 2020-03-12 LAB — GLUCOSE, CAPILLARY
Glucose-Capillary: 103 mg/dL — ABNORMAL HIGH (ref 70–99)
Glucose-Capillary: 119 mg/dL — ABNORMAL HIGH (ref 70–99)
Glucose-Capillary: 46 mg/dL — ABNORMAL LOW (ref 70–99)
Glucose-Capillary: 118 mg/dL — ABNORMAL HIGH (ref 70–99)
Glucose-Capillary: 163 mg/dL — ABNORMAL HIGH (ref 70–99)

## 2020-03-12 LAB — SARS CORONAVIRUS 2 (TAT 6-24 HRS): SARS Coronavirus 2: NEGATIVE

## 2020-03-12 NOTE — Discharge Summary (Signed)
Patient ID: Melissa [name] MRN: [id number] DOB/AGE: [birth year] 68 y.o.  Admit date: 03/08/2020 Discharge date: 03/12/2020  Admission Diagnoses:  Principal Problem:   Unilateral primary osteoarthritis, right knee Active Problems:   Status post total right knee replacement   Discharge Diagnoses:  Same  Past Medical History:  Diagnosis Date  . Arthritis    osteo arthritis  . Hypertension   . Hypothyroidism   . Pre-diabetes    Takes metformin  . Walking pneumonia     Surgeries: Procedure(s): RIGHT TOTAL KNEE ARTHROPLASTY AND LEFT KNEE STEROID INJECTION on 03/08/2020   Consultants:   Discharged Condition: Improved  Hospital Course: Melissa [name] is an 69 y.o. female who was admitted 03/08/2020 for operative treatment ofUnilateral primary osteoarthritis, right knee. Patient has severe unremitting pain that affects sleep, daily activities, and work/hobbies. After pre-op clearance the patient was taken to the operating room on 03/08/2020 and underwent  Procedure(s): RIGHT TOTAL KNEE ARTHROPLASTY AND LEFT KNEE STEROID INJECTION.    Patient was given perioperative antibiotics:  Anti-infectives (From admission, onward)   Start     Dose/Rate Route Frequency Ordered Stop   03/08/20 1600  ceFAZolin (ANCEF) IVPB 1 g/50 mL premix     1 g 100 mL/hr over 30 Minutes Intravenous Every 6 hours 03/08/20 1522 03/08/20 2330   03/08/20 0630  ceFAZolin (ANCEF) IVPB 2g/100 mL premix     2 g 200 mL/hr over 30 Minutes Intravenous On call to O.R. 03/08/20 0620 03/08/20 0830       Patient was given sequential compression devices, early ambulation, and chemoprophylaxis to prevent DVT.  Patient benefited maximally from hospital stay and there were no complications.    Recent vital signs:  Patient Vitals for the past 24 hrs:  BP Temp Temp src Pulse Resp SpO2  03/12/20 0458 108/67 98.7 F (37.1 C) Oral 82 20 98 %  03/11/20 2133 106/60 99.2 F (37.3 C) Oral 87 16 94 %  03/11/20 1313 (!)  105/54 99 F (37.2 C) Oral 97 17 95 %     Recent laboratory studies: No results for input(s): WBC, HGB, HCT, PLT, NA, K, CL, CO2, BUN, CREATININE, GLUCOSE, INR, CALCIUM in the last 72 hours.  Invalid input(s): PT, 2   Discharge Medications:   Allergies as of 03/12/2020   No Known Allergies     Medication List    STOP taking these medications   ibuprofen 200 MG tablet Commonly known as: ADVIL   Turmeric 500 MG Caps     TAKE these medications   atorvastatin 40 MG tablet Commonly known as: LIPITOR Take 40 mg by mouth daily.   Biotin 1000 MCG tablet Take 1,000 mcg by mouth daily.   CALCIUM-VITAMIN D PO Take 1 tablet by mouth daily.   ECHINACEA HERB PO Take 400 mg by mouth daily.   levothyroxine 75 MCG tablet Commonly known as: SYNTHROID Take 75 mcg by mouth daily before breakfast.   lisinopril-hydrochlorothiazide 20-25 MG tablet Commonly known as: ZESTORETIC Take 1 tablet by mouth daily.   LIVER SUPPORT SL Take 1 tablet by mouth daily. Liver Refresh   magnesium oxide 400 MG tablet Commonly known as: MAG-OX Take 400 mg by mouth daily.   metFORMIN 750 MG 24 hr tablet Commonly known as: GLUCOPHAGE-XR Take 1,500 mg by mouth every evening.   methocarbamol 500 MG tablet Commonly known as: ROBAXIN Take 1 tablet (500 mg total) by mouth every 6 (six) hours as needed for muscle spasms.   OSTEO BI-FLEX ADV TRIPLE  ST PO Take 2 tablets by mouth daily.   oxyCODONE 5 MG immediate release tablet Commonly known as: Oxy IR/ROXICODONE Take 1-2 tablets (5-10 mg total) by mouth every 4 (four) hours as needed for moderate pain (pain score 4-6).   rivaroxaban 10 MG Tabs tablet Commonly known as: XARELTO Take 1 tablet (10 mg total) by mouth daily with breakfast. Take for the next 10 days only.   Vitamin D 125 MCG (5000 UT) Caps Take 5,000 Units by mouth daily.   zinc gluconate 50 MG tablet Take 50 mg by mouth daily.            Durable Medical Equipment  (From  admission, onward)         Start     Ordered   03/08/20 1523  DME 3 n 1  Once     03/08/20 1522   03/08/20 1523  DME Walker rolling  Once    Question Answer Comment  Walker: With 5 Inch Wheels   Patient needs a walker to treat with the following condition Status post total right knee replacement      03/08/20 1522          Diagnostic Studies: DG Knee Right Port  Result Date: 03/08/2020 CLINICAL DATA:  Postop for right knee surgery EXAM: PORTABLE RIGHT KNEE - 1-2 VIEW COMPARISON:  01/29/2020 FINDINGS: Total knee arthroplasty with well-seated prosthesis. No periprosthetic fracture or subluxation. There is expected postoperative gas and swelling. IMPRESSION: No unexpected finding after total knee arthroplasty. Electronically Signed   By: Monte Fantasia M.D.   On: 03/08/2020 11:24    Disposition: Discharge disposition: 03-Skilled [place] information for follow-up providers    Mcarthur Rossetti, MD Follow up in 2 week(s).   Specialty: Orthopedic Surgery Contact information: [place] [address] [phone]            Contact information for after-discharge care    Destination    [facility] .   Service: Skilled Nursing Contact information: [address] [place] [phone]                   Signed: Mcarthur Rossetti 03/12/2020, 7:30 AM

## 2020-03-12 NOTE — Progress Notes (Signed)
Continue to await insurance authorization [facility]) for SNF.    Bertine Schlottman, LCSW

## 2020-03-12 NOTE — Progress Notes (Signed)
Orthopedic Tech Progress Note Patient Details:  Melissa [name] 03-13-1951 [id number] 0-70 CPM Right Knee CPM Right Knee: Off Right Knee Flexion (Degrees): 65 Right Knee Extension (Degrees): 0  Post Interventions Patient Tolerated: Well  [name] November 03/12/2020, 12:39 PM

## 2020-03-12 NOTE — Progress Notes (Signed)
Patient ID: Melissa [name], female   DOB: 09-29-1951, 69 y.o.   MRN: [id number] No acute changes.  Awaiting skilled nursing placement.  She does qualify for short-term SNF due to the fact that she lives alone and her bedroom and bathroom are upstairs.  Can be discharged today to SNF.  Therapy also recommends this.

## 2020-03-12 NOTE — Progress Notes (Signed)
Physical Therapy Treatment Patient Details Name: Melissa [name] MRN: [id number] DOB: 03-12-1951 Today's Date: 03/12/2020    History of Present Illness Patient is 69 y.o. female s/p Rt TKA on 03/08/20 with PMH significant for hypothyroidism, HTN, OA.     PT Comments    POD # 4 Pt progressing slowly.  Assisted OOB.  General bed mobility comments: required increased time and asssit R LE off and back on bed.  General transfer comment: 25% cues for hand placement with RW, steady assist, increased time.  Also assisted with toilet transfer.  Unsteady.  HIGH FALL RISK.  General Gait Details: decreased amb distance due to increased pain "other" knee which pt states is "bad".  Unsteady gait esp with turns and back steps.  HIGH FALL RISK. Performed some TE's then placed in CPM along with ICE on B knees. Pt lives home alone and will need ST Rehab at SNF prior to returning.   Follow Up Recommendations  Follow surgeon's recommendation for DC plan and follow-up therapies;SNF     Equipment Recommendations  Rolling walker with 5" wheels    Recommendations for Other Services       Precautions / Restrictions Precautions Precaution Comments: instructed no pillow under knee Restrictions Weight Bearing Restrictions: No    Mobility  Bed Mobility Overal bed mobility: Needs Assistance Bed Mobility: Supine to Sit;Sit to Supine     Supine to sit: HOB elevated;Min assist Sit to supine: Min assist   General bed mobility comments: required increased time and asssit R LE off and back on bed  Transfers Overall transfer level: Needs assistance Equipment used: Rolling walker (2 wheeled) Transfers: Sit to/from Stand   Stand pivot transfers: Min guard;Min assist       General transfer comment: 25% cues for hand placement with RW, steady assist, increased time.  Also assisted with toilet transfer.  Unsteady.  HIGH FALL RISK  Ambulation/Gait Ambulation/Gait assistance: Min guard;Supervision Gait  Distance (Feet): 42 Feet Assistive device: Rolling walker (2 wheeled) Gait Pattern/deviations: Step-to pattern;Decreased step length - left;Decreased stance time - right;Decreased stride length;Decreased weight shift to right;Antalgic Gait velocity: decreased   General Gait Details: decreased amb distance due to increased pain "other" knee which pt states is "bad".  Unsteady gait esp with turns and back steps.  HIGH FALL RISK.   Stairs             Wheelchair Mobility    Modified Rankin (Stroke Patients Only)       Balance                                            Cognition Arousal/Alertness: Awake/alert Behavior During Therapy: WFL for tasks assessed/performed Overall Cognitive Status: Within Functional Limits for tasks assessed                                 General Comments: AxO x 4 pleasant      Exercises   Total Knee Replacement TE's following HEP handout 10 reps B LE ankle pumps 05 reps towel squeezes 05 reps knee presses 05 reps heel slides  05 reps SAQ's 05 reps SLR's 05 reps ABD Educated on use of gait belt to assist with TE's Followed by ICE     General Comments        Pertinent Vitals/Pain Pain  Assessment: 0-10 Pain Score: 8  Pain Location: Rt knee 8/10 and L knee 4/10 Pain Descriptors / Indicators: Aching;Discomfort;Sore;Tightness Pain Intervention(s): Monitored during session;Premedicated before session;Repositioned;Ice applied    Home Living                      Prior Function            PT Goals (current goals can now be found in the care plan section) Progress towards PT goals: Progressing toward goals    Frequency    7X/week      PT Plan Current plan remains appropriate    Co-evaluation              AM-PAC PT "6 Clicks" Mobility   Outcome Measure  Help needed turning from your back to your side while in a flat bed without using bedrails?: A Little Help needed moving  from lying on your back to sitting on the side of a flat bed without using bedrails?: A Little Help needed moving to and from a bed to a chair (including a wheelchair)?: A Little Help needed standing up from a chair using your arms (e.g., wheelchair or bedside chair)?: A Little Help needed to walk in hospital room?: A Little Help needed climbing 3-5 steps with a railing? : A Little 6 Click Score: 18    End of Session Equipment Utilized During Treatment: Gait belt Activity Tolerance: Patient limited by fatigue;Patient limited by pain Patient left: in bed;in CPM;with call bell/phone within reach   PT Visit Diagnosis: Muscle weakness (generalized) (M62.81);Difficulty in walking, not elsewhere classified (R26.2)     Time: 1350-1415 PT Time Calculation (min) (ACUTE ONLY): 25 min  Charges:  $Gait Training: 8-22 mins $Therapeutic Activity: 8-22 mins                     Rica Koyanagi  PTA Acute  Rehabilitation Services Pager      [phone] Office      [phone]

## 2020-03-13 ENCOUNTER — Encounter (HOSPITAL_COMMUNITY): Payer: Self-pay | Admitting: Orthopaedic Surgery

## 2020-03-13 LAB — GLUCOSE, CAPILLARY
Glucose-Capillary: 103 mg/dL — ABNORMAL HIGH (ref 70–99)
Glucose-Capillary: 123 mg/dL — ABNORMAL HIGH (ref 70–99)
Glucose-Capillary: 111 mg/dL — ABNORMAL HIGH (ref 70–99)

## 2020-03-13 NOTE — Progress Notes (Addendum)
Physical Therapy Treatment Patient Details Name: Melissa [name] MRN: [id number] DOB: 12/25/1950 Today's Date: 03/13/2020    History of Present Illness Patient is 69 y.o. female s/p Rt TKA on 03/08/20 with PMH significant for hypothyroidism, HTN, OA.     PT Comments    POD # 5 General Comments: very tearful this morning and frustrated that her Insurance is delaying her D/C to Rehab. Assisted OOB to bathroom and with a shower.   General bed mobility comments: required increased time and used her belt to self assist LE off bed. General transfer comment: required increased time to rise.  Also assisted with a shower transfer.  25% VC's on proper sequencing stepping in/out shower.  Assisted with static standing inshower with VC's on safety and use of grab bars.  Required assist with lower body and required assist with static standing due to balance.   General Gait Details: tolerated an increased distance , "feeling better".  Increased steadiness with turns and less support thru walker.  Pt reports her "other" knee is hurting. Pt lives home alone and has multiple steps.  Pt will need ST Rehab at SNF prior to safely returning.  Follow Up Recommendations  Follow surgeon's recommendation for DC plan and follow-up therapies;SNF     Equipment Recommendations  Rolling walker with 5" wheels    Recommendations for Other Services       Precautions / Restrictions Precautions Precautions: Fall Precaution Comments: instructed no pillow under knee Restrictions RLE Weight Bearing: Weight bearing as tolerated Other Position/Activity Restrictions: WBAT    Mobility  Bed Mobility Overal bed mobility: Needs Assistance Bed Mobility: Supine to Sit     Supine to sit: Supervision;Min guard     General bed mobility comments: required increased time and used her belt to self assist LE off bed  Transfers Overall transfer level: Needs assistance Equipment used: Rolling walker (2 wheeled) Transfers:  Sit to/from [facility] Sit to Stand: Supervision Stand pivot transfers: Supervision;Min guard       General transfer comment: required increased time to rise.  Also assisted with a shower transfer.  25% VC's on proper sequencing stepping in/out shower.  Assisted with static standing inshower with VC's on safety and use of grab bars.  Ambulation/Gait Ambulation/Gait assistance: Supervision;Min guard Gait Distance (Feet): 57 Feet Assistive device: Rolling walker (2 wheeled) Gait Pattern/deviations: Step-to pattern;Decreased step length - left;Decreased stance time - right;Decreased stride length;Decreased weight shift to right Gait velocity: decreased   General Gait Details: tolerated an increased distance , "feeling better".  Increased steadiness with turns and less support thru walker.   Stairs             Wheelchair Mobility    Modified Rankin (Stroke Patients Only)       Balance                                            Cognition Arousal/Alertness: Awake/alert Behavior During Therapy: WFL for tasks assessed/performed Overall Cognitive Status: Within Functional Limits for tasks assessed                                 General Comments: very tearful this morning and frustrated that her Insurance is delying her D/C to Rehab.      Exercises   Total Knee Replacement TE's  following HEP handout 10 reps B LE ankle pumps 05 reps towel squeezes 05 reps knee presses 05 reps heel slides  05 reps SAQ's 05 reps SLR's 05 reps ABD Educated on use of gait belt to assist with TE's Followed by ICE    General Comments        Pertinent Vitals/Pain Pain Assessment: 0-10 Pain Score: 5  Pain Location: R knee 5/10 Pain Descriptors / Indicators: Aching;Discomfort;Sore;Tightness;Operative site guarding Pain Intervention(s): Monitored during session;Premedicated before session;Repositioned;Ice applied    Home Living                       Prior Function            PT Goals (current goals can now be found in the care plan section) Progress towards PT goals: Progressing toward goals    Frequency    7X/week      PT Plan Current plan remains appropriate    Co-evaluation              AM-PAC PT "6 Clicks" Mobility   Outcome Measure  Help needed turning from your back to your side while in a flat bed without using bedrails?: None Help needed moving from lying on your back to sitting on the side of a flat bed without using bedrails?: A Little Help needed moving to and from a bed to a chair (including a wheelchair)?: A Little Help needed standing up from a chair using your arms (e.g., wheelchair or bedside chair)?: A Little Help needed to walk in hospital room?: A Little Help needed climbing 3-5 steps with a railing? : A Little 6 Click Score: 19    End of Session Equipment Utilized During Treatment: Gait belt Activity Tolerance: Patient limited by fatigue;Patient limited by pain Patient left: in chair;with call bell/phone within reach   PT Visit Diagnosis: Muscle weakness (generalized) (M62.81);Difficulty in walking, not elsewhere classified (R26.2)     Time: 1040-1120 PT Time Calculation (min) (ACUTE ONLY): 40 min  Charges:  $Gait Training: 8-22 mins $Therapeutic Exercise: 8-22 mins $Therapeutic Activity: 8-22 mins                     Rica Koyanagi  PTA Acute  Rehabilitation Services Pager      [phone] Office      [phone]

## 2020-03-13 NOTE — Progress Notes (Signed)
Physical Therapy Treatment Patient Details Name: Melissa [name] MRN: [id number] DOB: 10-07-1951 Today's Date: 03/13/2020    History of Present Illness Patient is 69 y.o. female s/p Rt TKA on 03/08/20 with PMH significant for hypothyroidism, HTN, OA.     PT Comments    POD # 5 pm session Pt tolerated CPM this afternoon > 2 hours.  Assisted OOB to amb in hallway.  General Gait Details: tolerated an increased distance , "feeling better".  Increased steadiness with turns and less support thru walker.General transfer comment: required increased time with safety caution to environmental hazzards, cords, edge of bed.  Pt wearing a mask inhibits her downward view. Then returned to room to perform some TE's following HEP handout.  Instructed on proper tech, freq as well as use of ICE.   Will practice stairs tomorrow in anticipation for D/C to home as Insurance continues to deny Rehab at [facility].  Follow Up Recommendations  Follow surgeon's recommendation for DC plan and follow-up therapies;SNF     Equipment Recommendations  Rolling walker with 5" wheels    Recommendations for Other Services       Precautions / Restrictions Precautions Precaution Comments: instructed no pillow under knee Restrictions Weight Bearing Restrictions: No RLE Weight Bearing: Weight bearing as tolerated Other Position/Activity Restrictions: WBAT    Mobility  Bed Mobility Overal bed mobility: Needs Assistance Bed Mobility: Supine to Sit;Sit to Supine     Supine to sit: Supervision Sit to supine: Supervision   General bed mobility comments: required increased time and used her belt to self assist LE off bed  Transfers Overall transfer level: Needs assistance Equipment used: Rolling walker (2 wheeled) Transfers: Sit to/from [facility] Sit to Stand: Supervision Stand pivot transfers: Supervision       General transfer comment: required increased time with safety caution to environmental  hazzards, cords, edge of bed.  Pt wearing a mask inhibits her downward view.  Ambulation/Gait Ambulation/Gait assistance: Supervision Gait Distance (Feet): 85 Feet Assistive device: Rolling walker (2 wheeled) Gait Pattern/deviations: Step-to pattern;Decreased step length - left;Decreased stance time - right;Decreased stride length;Decreased weight shift to right Gait velocity: decreased   General Gait Details: tolerated an increased distance , "feeling better".  Increased steadiness with turns and less support thru walker.   Stairs             Wheelchair Mobility    Modified Rankin (Stroke Patients Only)       Balance                                            Cognition Arousal/Alertness: Awake/alert Behavior During Therapy: WFL for tasks assessed/performed Overall Cognitive Status: Within Functional Limits for tasks assessed                                 General Comments: pt feeling "Okay" tolerated CPM for 2 hours this afternoon      Exercises  10 reps knee presses and HS    General Comments        Pertinent Vitals/Pain Pain Assessment: No/denies pain Pain Score: 7  Pain Location: R knee Pain Descriptors / Indicators: Aching;Discomfort;Sore;Tightness;Operative site guarding Pain Intervention(s): Monitored during session;Premedicated before session;Repositioned;RN gave pain meds during session;Ice applied    Home Living  Prior Function            PT Goals (current goals can now be found in the care plan section) Progress towards PT goals: Progressing toward goals    Frequency    7X/week      PT Plan Current plan remains appropriate    Co-evaluation              AM-PAC PT "6 Clicks" Mobility   Outcome Measure  Help needed turning from your back to your side while in a flat bed without using bedrails?: None Help needed moving from lying on your back to sitting on the side  of a flat bed without using bedrails?: A Little Help needed moving to and from a bed to a chair (including a wheelchair)?: A Little Help needed standing up from a chair using your arms (e.g., wheelchair or bedside chair)?: A Little Help needed to walk in hospital room?: A Little Help needed climbing 3-5 steps with a railing? : A Little 6 Click Score: 19    End of Session Equipment Utilized During Treatment: Gait belt Activity Tolerance: Patient tolerated treatment well Patient left: in bed;with call bell/phone within reach;with bed alarm set Nurse Communication: Mobility status;Patient requests pain meds PT Visit Diagnosis: Muscle weakness (generalized) (M62.81);Difficulty in walking, not elsewhere classified (R26.2)     Time: [id number] PT Time Calculation (min) (ACUTE ONLY): 25 min  Charges:  $Gait Training: 8-22 mins $Therapeutic Activity: 8-22 mins                     Rica Koyanagi  PTA Acute  Rehabilitation Services Pager      [phone] Office      [phone]

## 2020-03-13 NOTE — Progress Notes (Signed)
Orthopedic Tech Progress Note Patient Details:  Melissa [name] [birth year] [id number]  CPM Right Knee CPM Right Knee: On Right Knee Flexion (Degrees): 80 Right Knee Extension (Degrees): 0 Additional Comments: 2 Hour Duration  Post Interventions Patient Tolerated: Well Instructions Provided: Adjustment of device  [name] 03/13/2020, 2:02 PM

## 2020-03-13 NOTE — Progress Notes (Signed)
Patient ID: Melissa [name], female   DOB: 12/14/1950, 69 y.o.   MRN: [id number] The patient's insurance company is still denying her skilled nursing placement for short-term stating that the therapy notes do not reflect that she needs that level of care.  They did recommend an OT evaluation to assess the patient's ADLs which could potentially help with getting the patient to be approved for skilled nursing.  Given the fact that she still cannot get up her stairs into her bedroom and her bathroom is located upstairs on not sure what else that I can recommend right now until we get a good idea of whether or not she can do these activities at home.  We need physical therapy to also continue to work with her while she is in the hospital.  Then when we have the notes from OT and PT that potentially correlate with her needing to go to skilled nursing we can submit those to insurance.

## 2020-03-13 NOTE — TOC Progression Note (Addendum)
Transition of Care [facility]) - Progression Note    Patient Details  Name: Melissa [name] MRN: [id number] Date of Birth: December 06, 1950  [facility] CM/SW Contact  Leeroy Cha, RN Phone Number: 03/13/2020, 10:09 AM  Clinical Narrative:    Spoke withpatient updated as to progress and dr Ninfa Linden doing the p2p. tct-dr. Ninfa Linden at [phone] information below for p2p call given. Per Loree Fee at [facility] stay has been denied due to lack of rehability in the patient.  Dr. Ninfa Linden notified through direct messaging to do a p2p at [phone] option 3 ref number [id number] dr. Eloise Levels Spent time explaining to patient that we are just simply awaiting decision from [facility] for her snf stay that should be approved today.  Patient very weepy and frustrated.  I have spoken to [place] at [place] this am she is calling [employer] to check on auth.   Expected Discharge Plan: [place] Barriers to Discharge: Continued Medical Work up, Ship broker  Expected Discharge Plan and Services Expected Discharge Plan: [place] In-house Referral: NA Discharge Planning Services: NA Post Acute Care Choice: [place] Living arrangements for the past 2 months: Single Family Home Expected Discharge Date: 03/12/20               DME Arranged: N/A DME Agency: NA       HH Arranged: NA           Social Determinants of Health (SDOH) Interventions    Readmission Risk Interventions No flowsheet data found.

## 2020-03-13 NOTE — Progress Notes (Signed)
Patient ID: Melissa [name], female   DOB: [birth year], 69 y.o.   MRN: [id number] There is been no acute changes in the patient's medical status.  Her right total knee is stable.  She is still waiting authorization from insurance for skilled nursing placement.  She lives alone and is unable to go home.  It is essential that physical therapy still work with the patient while she is in the hospital awaiting placement.  Nursing will help facilitate that hopefully.

## 2020-03-14 NOTE — Progress Notes (Signed)
Orthopedic Tech Progress Note Patient Details:  Melissa [name] 1951-04-27 [id number]  Patient ID: [name], female   DOB: 06/24/1951, 69 y.o.   MRN: [id number]   [name] 03/14/2020, 2:35 PM RN removed cpm after approx 2 hrs

## 2020-03-14 NOTE — Progress Notes (Signed)
Patient ID: Melissa [name], female   DOB: 1951/01/06, 69 y.o.   MRN: [id number] I let the patient know that her insurance company has still denied short-term skilled nursing placement.  I did speak to a physician from her insurance company and did a peer to peer review.  However, this was not successful and the insurance company still denied her going to skilled nursing.  Unfortunately, the peer to peer MD that I talked to was not sympathetic at all to my patient and even kept bringing up her own personal situation of being nonweightbearing nonweightbearing for the last 2 months stating that she could do it so I could the patient.  This is unfortunate that the MD from her insurance company made it personal like that.  Regardless though, they are not going to approve her for skilled nursing so we will have to discharge her to home.  The transitional care team will have to make sure that all equipment needs and other therapy needs are met for the patient to be able to safely transition to home hopefully by tomorrow.

## 2020-03-14 NOTE — Progress Notes (Signed)
Orthopedic Tech Progress Note Patient Details:  Melissa [name] Jan 20, 1951 [id number]  Patient ID: Melissa [name], female   DOB: 06/12/1951, 69 y.o.   MRN: [id number]   [name] 03/14/2020, 8:27 PM Pt removed from cpm

## 2020-03-14 NOTE — Progress Notes (Signed)
Orthopedic Tech Progress Note Patient Details:  Melissa [name] 05/25/1951 [id number]  Patient ID: Melissa [name], female   DOB: 15-Jun-1951, 69 y.o.   MRN: [id number]   Melissa [name] 03/14/2020, 6:49 PM Pt placed in cpm

## 2020-03-14 NOTE — Progress Notes (Signed)
Physical Therapy Treatment Patient Details Name: Melissa [name] MRN: [id number] DOB: 03/08/1951 Today's Date: 03/14/2020    History of Present Illness Patient is 69 y.o. female s/p Rt TKA on 03/08/20 with PMH significant for hypothyroidism, HTN, OA.    PT Comments    POD # 6 General stair comments: Attempted 5 steps with one rail pt required Mod/Max  Assist (increased assist with down).  VERY unsteady.  Near fall with downward step.  Instant increased pain and anxiety.  Pt does NOT have enough knee stability to safely perform.  HIGH FALL RISK.  General transfer comment: required increased time and 25% VC's on safety with turns.   Also practiced a tub step over transfer with 50% VC's on stepping in with the "good" and stepping out with the "bad".  Pt was unable to perform safely due to increased pain and did not have the knee stability to hold her weight.  HIGH FALL RISK.  Pt relies heavily on walker.   Pt lives home alone and poses a HIGH re admit risk - HIGH FALL RISK.  Pt needs Rehab at SNF before safe return to home.   Follow Up Recommendations  SNF     Equipment Recommendations  Rolling walker with 5" wheels    Recommendations for Other Services       Precautions / Restrictions Precautions Precautions: Fall Precaution Comments: instructed no pillow under knee Restrictions Weight Bearing Restrictions: No RLE Weight Bearing: Weight bearing as tolerated Other Position/Activity Restrictions: WBAT    Mobility  Bed Mobility Overal bed mobility: Needs Assistance Bed Mobility: Supine to Sit     Supine to sit: Supervision     General bed mobility comments: OOB in recliner  Transfers Overall transfer level: Needs assistance Equipment used: Rolling walker (2 wheeled) Transfers: Sit to/from [facility] Sit to Stand: Supervision Stand pivot transfers: Supervision;Min guard       General transfer comment: required increased time and 25% VC's on safety with  turns.   Also practiced a tub step over transfer with 50% VC's on stepping in with the "good" and stepping out with the "bad".  Pt was unable to perform safely duwe to increased pain and did not have the knee stability to hold her weight.  HIGH FALL RISK.  Ambulation/Gait Ambulation/Gait assistance: Supervision;Min assist Gait Distance (Feet): 18 Feet Assistive device: Rolling walker (2 wheeled) Gait Pattern/deviations: Step-to pattern;Decreased step length - left;Decreased stance time - right;Decreased stride length;Decreased weight shift to right     General Gait Details: decreased amb distance to focus on stairs. Unable to tolerate gait after due to increased pain, increased anxiety and increased fear of falling.   Stairs Stairs: Yes Stairs assistance: Max assist;+2 physical assistance;+2 safety/equipment Stair Management: One rail Right Number of Stairs: 5 General stair comments: Attempted 5 steps with one rail pt required Mod/Max  Assist (increased assist with down).  VERY unsteady.  Near fall with downward step.  Instant increased pain and anxiety.  Pt does NOT have enough knee stability to safely perform.  HIGH FALL RISK.   Wheelchair Mobility    Modified Rankin (Stroke Patients Only)       Balance Overall balance assessment: No apparent balance deficits (not formally assessed) Sitting-balance support: No upper extremity supported;Feet supported Sitting balance-Leahy Scale: Good     Standing balance support: Single extremity supported(Patient holds onto sink, grab bar, walker with one extremity at times during daily tasks) Standing balance-Leahy Scale: Fair  Cognition Arousal/Alertness: Awake/alert Behavior During Therapy: WFL for tasks assessed/performed Overall Cognitive Status: Within Functional Limits for tasks assessed                                 General Comments: pt feeling frustrated and worried "what  if I go home and fall" Pt's insurance continues to deny SNF for ST Rehab      Exercises      General Comments        Pertinent Vitals/Pain Pain Assessment: 0-10 Pain Score: 7  Pain Location: R knee Pain Descriptors / Indicators: Grimacing;Tightness;Tender;Operative site guarding Pain Intervention(s): Premedicated before session;Monitored during session;Ice applied;Repositioned    Home Living Family/patient expects to be discharged to:: Skilled nursing facility([facility] for ~10 days) Living Arrangements: Alone Available Help at Discharge: Family;Other (Comment)(brother is going to stay with her for 1 week after discharge from rehab.) Type of Home: House Home Access: Stairs to enter Entrance Stairs-Rails: None Home Layout: Two level;Bed/bath upstairs;1/2 bath on main level Home Equipment: None      Prior Function Level of Independence: Independent          PT Goals (current goals can now be found in the care plan section) Acute Rehab PT Goals Patient Stated Goal: To go to rehab in order "To get better to do those things" in regards to cooking and laundry. Progress towards PT goals: Progressing toward goals    Frequency    7X/week      PT Plan Current plan remains appropriate    Co-evaluation              AM-PAC PT "6 Clicks" Mobility   Outcome Measure  Help needed turning from your back to your side while in a flat bed without using bedrails?: None Help needed moving from lying on your back to sitting on the side of a flat bed without using bedrails?: A Little   Help needed standing up from a chair using your arms (e.g., wheelchair or bedside chair)?: A Little Help needed to walk in hospital room?: A Little Help needed climbing 3-5 steps with a railing? : A Lot 6 Click Score: 15    End of Session Equipment Utilized During Treatment: Gait belt Activity Tolerance: No increased pain Patient left: in chair;with call bell/phone within reach Nurse  Communication: Mobility status PT Visit Diagnosis: Muscle weakness (generalized) (M62.81);Difficulty in walking, not elsewhere classified (R26.2)     Time: [id number] PT Time Calculation (min) (ACUTE ONLY): 25 min  Charges:  $Gait Training: 8-22 mins $Therapeutic Activity: 8-22 mins                     Rica Koyanagi  PTA Acute  Rehabilitation Services Pager      [phone] Office      [phone]

## 2020-03-14 NOTE — Evaluation (Signed)
Occupational Therapy Evaluation Patient Details Name: Melissa [name] MRN: [id number] DOB: [birth year] Today's Date: 03/14/2020    History of Present Illness Patient is 69 y.o. female s/p Rt TKA on 03/08/20 with PMH significant for hypothyroidism, HTN, OA.   Clinical Impression   On evaluation Ms. Melissa [name] presents with decreased ROM and strength of right knee, decreased activity tolerance and pain resulting in impaired ability to perform independent ambulation and IADLs. She demonstrates ability to perform her basic daily activities including dressing, toileting, partial bathing at sink and grooming with supervision and is ambulating in room with RW and supervision. However, patient reports no significant assistance at home to help her with cooking, grocery shopping, cleaning, tub transfers, traversing steps and driving and is at a moderate fall risk due to her recent knee surgery. Patient will benefit from OT services to improve activity tolerance needed for home tasks, education for compensatory strategies and home safety, and use of DME. Patient will benefit from short term rehab at discharge to return to her baseline independence and reduce risk of falls and potential injury.    Follow Up Recommendations  SNF    Equipment Recommendations  Tub/shower seat;Toilet riser    Recommendations for Other Services       Precautions / Restrictions Precautions Precautions: Fall Precaution Comments: instructed no pillow under knee Restrictions Weight Bearing Restrictions: No RLE Weight Bearing: Weight bearing as tolerated      Mobility Bed Mobility Overal bed mobility: Needs Assistance Bed Mobility: Supine to Sit     Supine to sit: Supervision        Transfers Overall transfer level: Needs assistance Equipment used: Rolling walker (2 wheeled) Transfers: Sit to/from Stand Sit to Stand: Supervision Stand pivot transfers: Supervision       General transfer comment:  Patient performed bed transfer, sit to stand, ambulation in room with RW. supervision for safety    Balance Overall balance assessment: No apparent balance deficits (not formally assessed) Sitting-balance support: No upper extremity supported;Feet supported Sitting balance-Leahy Scale: Good     Standing balance support: Single extremity supported(Patient holds onto sink, grab bar, walker with one extremity at times during daily tasks) Standing balance-Leahy Scale: Fair                             ADL either performed or assessed with clinical judgement   ADL Overall ADL's : Needs assistance/impaired Eating/Feeding: Independent   Grooming: Supervision/safety;Standing Grooming Details (indicate cue type and reason): supervision for standing at sink.     Lower Body Bathing: Supervison/ safety;Sit to/from stand Lower Body Bathing Details (indicate cue type and reason): Patient performed washing of buttocks and peri area while standing at the sink with supervision from therapist.     Lower Body Dressing: Supervision/safety Lower Body Dressing Details (indicate cue type and reason): Patient demonstrated ability to donn socks seated at edge of bed - using walker as a foot stool. Educated patient on use of reacher for doffing socks, grabbing items. Attempted to educate patient on use of sock aide but patient wanted to perform by herself. Toilet Transfer: Supervision/safety;Ambulation Toilet Transfer Details (indicate cue type and reason): Patient ambulated to bathroom with RW and performed toileting with supervision. VC to extend knee when sitting. Toileting- Clothing Manipulation and Hygiene: Supervision/safety;Sit to/from stand Toileting - Clothing Manipulation Details (indicate cue type and reason): supervision for safety, patient able to manage hospital gown. Patient declined dressing lower body at this  time.   Tub/Shower Transfer Details (indicate cue type and reason): Unable  to perform tub/shower at this time. Will f/u Functional mobility during ADLs: Supervision/safety;Min guard General ADL Comments: Patient able to perform functional transfers and ambulate in room with RW, CGA for safety.     Vision         Perception     Praxis      Pertinent Vitals/Pain Pain Score: 5  Pain Location: R knee Pain Descriptors / Indicators: Grimacing Pain Intervention(s): Monitored during session;Other (comment);Ice applied(pain medicine not due to 1030)     Hand Dominance Right   Extremity/Trunk Assessment Upper Extremity Assessment Upper Extremity Assessment: Overall WFL for tasks assessed   Lower Extremity Assessment Lower Extremity Assessment: Defer to PT evaluation       Communication Communication Communication: No difficulties   Cognition Arousal/Alertness: Awake/alert Behavior During Therapy: WFL for tasks assessed/performed Overall Cognitive Status: Within Functional Limits for tasks assessed                                     General Comments       Exercises     Shoulder Instructions      Home Living Family/patient expects to be discharged to:: Skilled nursing facility([facility] for ~10 days) Living Arrangements: Alone Available Help at Discharge: Family;Other (Comment)(brother is going to stay with her for 1 week after discharge from rehab.) Type of Home: House Home Access: Stairs to enter [employer] of Steps: 3 Entrance Stairs-Rails: None Home Layout: Two level;Bed/bath upstairs;1/2 bath on main level Alternate Level Stairs-Number of Steps: 13 Alternate Level Stairs-Rails: Can reach both Bathroom Shower/Tub: Teacher, early years/pre: Standard Bathroom Accessibility: No How Accessible: Other (comment)(Patient unsure of whether RW can fit into bathroom. No shower chair.) Home Equipment: None          Prior Functioning/Environment Level of Independence: Independent                  OT Problem List: Decreased knowledge of use of DME or AE;Decreased activity tolerance;Pain      OT Treatment/Interventions: Self-care/ADL training;Patient/family education;Therapeutic activities;DME and/or AE instruction    OT Goals(Current goals can be found in the care plan section) Acute Rehab OT Goals Patient Stated Goal: To go to rehab in order "To get better to do those things" in regards to cooking and laundry. OT Goal Formulation: With patient Time For Goal Achievement: 03/28/20 Potential to Achieve Goals: Good  OT Frequency: Min 2X/week   Barriers to D/C: Inaccessible home environment;Decreased caregiver support  Patient's home inaccessible due to steps without a railing to enter home and 13 steps to second floor where full bath and bedroom are located. Patient does not have assistance on return home until later in the month to assist her with grocery shopping, driving, cooking, cleaning, supervison for showe transfers or traversing flight of stairs. Patient will be unable to traverse stairs with walker - leaving her without a device on second floor.       Co-evaluation              AM-PAC OT "6 Clicks" Daily Activity     Outcome Measure Help from another person eating meals?: None Help from another person taking care of personal grooming?: None Help from another person toileting, which includes using toliet, bedpan, or urinal?: A Little Help from another person bathing (including washing, rinsing, drying)?: A Little  Help from another person to put on and taking off regular upper body clothing?: None Help from another person to put on and taking off regular lower body clothing?: A Little 6 Click Score: 17   End of Session Equipment Utilized During Treatment: Gait belt;Rolling walker  Activity Tolerance: Patient tolerated treatment well;Patient limited by pain Patient left: in chair;with call bell/phone within reach  OT Visit Diagnosis: Other abnormalities of gait and  mobility (R26.89)                [id number] OT Time Calculation (min): 32 min Charges:  OT General Charges $OT Visit: 1 Visit OT Evaluation $OT Eval Low Complexity: 1 Low OT Treatments $Self Care/Home Management : 8-22 mins  Derl Barrow, OTR/L Acute Care Rehab Services  Office [phone]   Lenward Chancellor 03/14/2020, 10:27 AM

## 2020-03-14 NOTE — Care Management Important Message (Signed)
Important Message  Patient Details IM Letter given to [facility] Case Manager to present to the Patient Name: AMANADA ROOT MRN: [id number] Date of Birth: 1951/01/10   Medicare Important Message Given:  Yes     Kerin Salen 03/14/2020, 12:02 PM

## 2020-03-14 NOTE — Progress Notes (Signed)
Physical Therapy Treatment Patient Details Name: Melissa [name] MRN: [id number] DOB: [birth year] Today's Date: 03/14/2020    History of Present Illness Patient is 69 y.o. female s/p Rt TKA on 03/08/20 with PMH significant for hypothyroidism, HTN, OA.    PT Comments    POD # 6 pm session Assisted OOB to amb in hallway.  General Gait Details: tolerated an increased distance but with heavy lean on walker and increased c/i knee "tightness". General transfer comment: increased time and VC's on safety with turns with increased c/o R knee "tightness".   Then returned to room to perform some TE's following HEP handout.  Instructed on proper tech, freq as well as use of ICE.     Follow Up Recommendations  SNF     Equipment Recommendations  Rolling walker with 5" wheels;3in1 (PT)  Long Tub Bench   Recommendations for Other Services       Precautions / Restrictions      Mobility  Bed Mobility Overal bed mobility: Needs Assistance Bed Mobility: Supine to Sit;Sit to Supine     Supine to sit: Supervision Sit to supine: Supervision   General bed mobility comments: increased time  Transfers Overall transfer level: Needs assistance Equipment used: Rolling walker (2 wheeled) Transfers: Sit to/from [facility] Sit to Stand: Supervision Stand pivot transfers: Supervision;Min guard       General transfer comment: increased time and VC's on safety with turns with increased c/o R knee "tightness"  Ambulation/Gait Ambulation/Gait assistance: Supervision;Min guard Gait Distance (Feet): 58 Feet Assistive device: Rolling walker (2 wheeled) Gait Pattern/deviations: Step-to pattern;Decreased step length - left;Decreased stance time - right;Decreased stride length;Decreased weight shift to right Gait velocity: decreased   General Gait Details: tolerated an increased distance but with heavy lean on walker and increased c/i knee "tightness".   Stairs Stairs: (too fatigued  to practice this afternoon)           Wheelchair Mobility    Modified Rankin (Stroke Patients Only)       Balance                                            Cognition                                              Exercises   Total Knee Replacement TE's following HEP handout 10 reps B LE ankle pumps 05 reps towel squeezes 05 reps knee presses 05 reps heel slides  05 reps SAQ's 05 reps SLR's 05 reps ABD Educated on use of gait belt to assist with TE's Followed by ICE     General Comments        Pertinent Vitals/Pain      Home Living                      Prior Function            PT Goals (current goals can now be found in the care plan section) Progress towards PT goals: Progressing toward goals    Frequency    7X/week      PT Plan Current plan remains appropriate    Co-evaluation  AM-PAC PT "6 Clicks" Mobility   Outcome Measure  Help needed turning from your back to your side while in a flat bed without using bedrails?: None Help needed moving from lying on your back to sitting on the side of a flat bed without using bedrails?: A Little Help needed moving to and from a bed to a chair (including a wheelchair)?: A Little Help needed standing up from a chair using your arms (e.g., wheelchair or bedside chair)?: A Little Help needed to walk in hospital room?: A Little Help needed climbing 3-5 steps with a railing? : A Lot 6 Click Score: 18    End of Session Equipment Utilized During Treatment: Gait belt Activity Tolerance: Patient limited by fatigue Patient left: in bed;with call bell/phone within reach Nurse Communication: Mobility status;Patient requests pain meds PT Visit Diagnosis: Muscle weakness (generalized) (M62.81);Difficulty in walking, not elsewhere classified (R26.2)     Time: 1446-1510 PT Time Calculation (min) (ACUTE ONLY): 24 min  Charges:  $Gait Training:  8-22 mins $Therapeutic Exercise: 8-22 mins                     Rica Koyanagi  PTA Acute  Rehabilitation Services Pager      [phone] Office      [phone]

## 2020-03-14 NOTE — TOC Progression Note (Signed)
Transition of Care [facility] - Progression Note    Patient Details  Name: BRAIDYN LINEBACK MRN: [id number] Date of Birth: 1951/09/15  Transition of [facility]) CM/SW Contact  Shade Flood, LCSW Phone Number: 03/14/2020, 11:30 AM  Clinical Narrative:     TOC following. Dr. Ninfa Linden completed Peer to Peer with pt's insurance yesterday and the insurance is still denying the SNF rehab. [employer] MD recommended OT evaluation as that might build a case for rehab. OT consult completed this AM. This LCSW contacted Whitney at [facility] and she will send the OT note as well as yesterday evening's PT note to [employer] to see if they will approve.  Will follow.  Expected Discharge Plan: [place] Barriers to Discharge: Continued Medical Work up, Ship broker  Expected Discharge Plan and Services Expected Discharge Plan: [place] In-house Referral: NA Discharge Planning Services: NA Post Acute Care Choice: [place] Living arrangements for the past 2 months: Single Family Home Expected Discharge Date: 03/12/20               DME Arranged: N/A DME Agency: NA       HH Arranged: NA           Social Determinants of Health (SDOH) Interventions    Readmission Risk Interventions No flowsheet data found.

## 2020-03-14 NOTE — Progress Notes (Signed)
Orthopedic Tech Progress Note Patient Details:  Melissa [name] [birth year] [id number]  CPM Right Knee CPM Right Knee: On Right Knee Flexion (Degrees): 85 Right Knee Extension (Degrees): 0 Additional Comments: 2 Hour Duration  Post Interventions Patient Tolerated: Well Instructions Provided: Adjustment of device  [name] 03/14/2020, 12:31 PM

## 2020-03-15 LAB — GLUCOSE, CAPILLARY: Glucose-Capillary: 138 mg/dL — ABNORMAL HIGH (ref 70–99)

## 2020-03-15 MED ORDER — OXYCODONE HCL 5 MG PO TABS: 5.0000 mg | ORAL_TABLET | ORAL | 0 refills | Status: DC | PRN

## 2020-03-15 MED ORDER — METHOCARBAMOL 500 MG PO TABS: 500.0000 mg | ORAL_TABLET | Freq: Four times a day (QID) | ORAL | 1 refills | Status: DC | PRN

## 2020-03-15 NOTE — Progress Notes (Signed)
Physical Therapy Treatment Patient Details Name: Melissa [name] MRN: [id number] DOB: 22-Sep-1951 Today's Date: 03/15/2020    History of Present Illness Patient is 69 y.o. female s/p Rt TKA on 03/08/20 with PMH significant for hypothyroidism, HTN, OA.    PT Comments    POD # 7 am session Assisted with amb around room to and from bathroom.  Assisted with a shower.  Static standing 75%/seated 25%.  Instructed on proper sequencing stepping in/out while using walker.  Assisted with static standing at sink x 4 min.  Then amb to recliner.  OT arrived to for Tx session dressing.  Follow Up Recommendations  SNF     Equipment Recommendations  Rolling walker with 5" wheels;3in1 (PT)    Recommendations for Other Services       Precautions / Restrictions Precautions Precautions: Fall Precaution Comments: instructed no pillow under knee Restrictions Weight Bearing Restrictions: No RLE Weight Bearing: Weight bearing as tolerated Other Position/Activity Restrictions: WBAT    Mobility  Bed Mobility               General bed mobility comments: OOB in recliner  Transfers Overall transfer level: Needs assistance Equipment used: Rolling walker (2 wheeled) Transfers: Sit to/from [facility] Sit to Stand: Supervision Stand pivot transfers: Supervision       General transfer comment: increased time and VC's on safety with turns with increased c/o R knee "tightness".  Also assisted with a shower transfer.  Ambulation/Gait Ambulation/Gait assistance: Supervision Gait Distance (Feet): 22 Feet Assistive device: Rolling walker (2 wheeled) Gait Pattern/deviations: Step-to pattern;Decreased step length - left;Decreased stance time - right;Decreased stride length;Decreased weight shift to right Gait velocity: decreased   General Gait Details: amb around room to and from bathroom   Stairs             Wheelchair Mobility    Modified Rankin (Stroke Patients  Only)       Balance                                            Cognition Arousal/Alertness: Awake/alert Behavior During Therapy: WFL for tasks assessed/performed Overall Cognitive Status: Within Functional Limits for tasks assessed                                 General Comments: pt going home now as her insurance continues to decline SNF Rehab      Exercises      General Comments        Pertinent Vitals/Pain Pain Assessment: 0-10 Pain Score: 3  Pain Location: R knee Pain Descriptors / Indicators: Tightness Pain Intervention(s): Premedicated before session;Monitored during session;Ice applied;Repositioned    Home Living                      Prior Function            PT Goals (current goals can now be found in the care plan section) Progress towards PT goals: Progressing toward goals    Frequency    7X/week      PT Plan Current plan remains appropriate    Co-evaluation              AM-PAC PT "6 Clicks" Mobility   Outcome Measure  Help needed turning from your back to your  side while in a flat bed without using bedrails?: None Help needed moving from lying on your back to sitting on the side of a flat bed without using bedrails?: A Little Help needed moving to and from a bed to a chair (including a wheelchair)?: A Little Help needed standing up from a chair using your arms (e.g., wheelchair or bedside chair)?: A Little Help needed to walk in hospital room?: A Little Help needed climbing 3-5 steps with a railing? : A Lot 6 Click Score: 18    End of Session Equipment Utilized During Treatment: Gait belt Activity Tolerance: Patient tolerated treatment well Patient left: in chair;with call bell/phone within reach Nurse Communication: Mobility status PT Visit Diagnosis: Muscle weakness (generalized) (M62.81);Difficulty in walking, not elsewhere classified (R26.2)     Time: 1020-1045 PT Time Calculation  (min) (ACUTE ONLY): 25 min  Charges:  $Therapeutic Activity: 8-22 mins                     Rica Koyanagi  PTA Acute  Rehabilitation Services Pager      [phone] Office      [phone]

## 2020-03-15 NOTE — TOC Transition Note (Signed)
Transition of Care [facility]) - CM/SW Discharge Note   Patient Details  Name: Melissa [name] MRN: [id number] Date of Birth: 10-12-1951  [facility] CM/SW Contact:  Shade Flood, LCSW Phone Number: 03/15/2020, 11:08 AM   Clinical Narrative:     Insurance has denied SNF rehab. Pt will dc home today with [facility] and a 3in1 from [facility]. Updated Santiago Glad of pt's inquiry on timing of PT evaluation and Santiago Glad stated she would stop by pt's room and meet with her.   There are no other TOC needs identified for dc.  Final next level of care: [place] Barriers to Discharge: Barriers Resolved   Patient Goals and CMS Choice Patient states their goals for this hospitalization and ongoing recovery are:: "discharge to [facility] for rehab" CMS Medicare.gov Compare Post Acute Care list provided to:: (Patient prearranged SNF her own SNF stay prior to arrival.) Choice offered to / list presented to : NA  Discharge Placement                       Discharge Plan and Services In-house Referral: NA Discharge Planning Services: NA Post Acute Care Choice: [place]          DME Arranged: 3-N-1 DME Agency: [facility] Date DME Agency Contacted: 03/15/20   Representative spoke with at DME Agency: [place]: PT [place]: [place] ([place]) Date [place]: 03/14/20   Representative spoke with at [place]: [place] ([place]) Interventions     Readmission Risk Interventions No flowsheet data found.

## 2020-03-15 NOTE — Discharge Summary (Signed)
Patient ID: MARVIE BASOM MRN: [id number] DOB/AGE: [birth year] 68 y.o.  Admit date: 03/08/2020 Discharge date: 03/15/2020  Admission Diagnoses:  Principal Problem:   Unilateral primary osteoarthritis, right knee Active Problems:   Status post total right knee replacement   Discharge Diagnoses:  Same  Past Medical History:  Diagnosis Date  . Arthritis    osteo arthritis  . Hypertension   . Hypothyroidism   . Pre-diabetes    Takes metformin  . Walking pneumonia     Surgeries: Procedure(s): RIGHT TOTAL KNEE ARTHROPLASTY AND LEFT KNEE STEROID INJECTION on 03/08/2020   Consultants:   Discharged Condition: Improved  Hospital Course: MYREL BOUFFARD is an 69 y.o. female who was admitted 03/08/2020 for operative treatment ofUnilateral primary osteoarthritis, right knee. Patient has severe unremitting pain that affects sleep, daily activities, and work/hobbies. After pre-op clearance the patient was taken to the operating room on 03/08/2020 and underwent  Procedure(s): RIGHT TOTAL KNEE ARTHROPLASTY AND LEFT KNEE STEROID INJECTION.    Patient was given perioperative antibiotics:  Anti-infectives (From admission, onward)   Start     Dose/Rate Route Frequency Ordered Stop   03/08/20 1600  ceFAZolin (ANCEF) IVPB 1 g/50 mL premix     1 g 100 mL/hr over 30 Minutes Intravenous Every 6 hours 03/08/20 1522 03/08/20 2330   03/08/20 0630  ceFAZolin (ANCEF) IVPB 2g/100 mL premix     2 g 200 mL/hr over 30 Minutes Intravenous On call to O.R. 03/08/20 0620 03/08/20 0830       Patient was given sequential compression devices, early ambulation, and chemoprophylaxis to prevent DVT.  Patient benefited maximally from hospital stay and there were no complications.    Recent vital signs:  Patient Vitals for the past 24 hrs:  BP Temp Temp src Pulse Resp SpO2  03/15/20 0810 (!) 120/58 - - 68 - -  03/15/20 0435 121/81 98.7 F (37.1 C) Oral 75 18 100 %  03/14/20 2055 136/71 98.5 F (36.9 C)  Oral 91 17 96 %  03/14/20 1352 108/64 98 F (36.7 C) Oral 81 16 99 %  03/14/20 0912 111/78 - - 68 - -     Recent laboratory studies: No results for input(s): WBC, HGB, HCT, PLT, NA, K, CL, CO2, BUN, CREATININE, GLUCOSE, INR, CALCIUM in the last 72 hours.  Invalid input(s): PT, 2   Discharge Medications:   Allergies as of 03/15/2020   No Known Allergies     Medication List    STOP taking these medications   ibuprofen 200 MG tablet Commonly known as: ADVIL   Turmeric 500 MG Caps     TAKE these medications   atorvastatin 40 MG tablet Commonly known as: LIPITOR Take 40 mg by mouth daily.   Biotin 1000 MCG tablet Take 1,000 mcg by mouth daily.   CALCIUM-VITAMIN D PO Take 1 tablet by mouth daily.   ECHINACEA HERB PO Take 400 mg by mouth daily.   levothyroxine 75 MCG tablet Commonly known as: SYNTHROID Take 75 mcg by mouth daily before breakfast.   lisinopril-hydrochlorothiazide 20-25 MG tablet Commonly known as: ZESTORETIC Take 1 tablet by mouth daily.   LIVER SUPPORT SL Take 1 tablet by mouth daily. Liver Refresh   magnesium oxide 400 MG tablet Commonly known as: MAG-OX Take 400 mg by mouth daily.   metFORMIN 750 MG 24 hr tablet Commonly known as: GLUCOPHAGE-XR Take 1,500 mg by mouth every evening.   methocarbamol 500 MG tablet Commonly known as: ROBAXIN Take 1 tablet (500 mg  total) by mouth every 6 (six) hours as needed for muscle spasms.   OSTEO BI-FLEX ADV TRIPLE ST PO Take 2 tablets by mouth daily.   oxyCODONE 5 MG immediate release tablet Commonly known as: Oxy IR/ROXICODONE Take 1-2 tablets (5-10 mg total) by mouth every 4 (four) hours as needed for moderate pain (pain score 4-6).   Vitamin D 125 MCG (5000 UT) Caps Take 5,000 Units by mouth daily.   zinc gluconate 50 MG tablet Take 50 mg by mouth daily.            Durable Medical Equipment  (From admission, onward)         Start     Ordered   03/08/20 1523  DME 3 n 1  Once      03/08/20 1522   03/08/20 1523  DME Walker rolling  Once    Question Answer Comment  Walker: With 5 Inch Wheels   Patient needs a walker to treat with the following condition Status post total right knee replacement      03/08/20 1522          Diagnostic Studies: DG Knee Right Port  Result Date: 03/08/2020 CLINICAL DATA:  Postop for right knee surgery EXAM: PORTABLE RIGHT KNEE - 1-2 VIEW COMPARISON:  01/29/2020 FINDINGS: Total knee arthroplasty with well-seated prosthesis. No periprosthetic fracture or subluxation. There is expected postoperative gas and swelling. IMPRESSION: No unexpected finding after total knee arthroplasty. Electronically Signed   By: Monte Fantasia M.D.   On: 03/08/2020 11:24    Disposition: Discharge disposition: 01-Home or Self Care          Contact information for follow-up providers    Mcarthur Rossetti, MD Follow up in 2 week(s).   Specialty: Orthopedic Surgery Contact information: [place] [address] [phone]            Contact information for after-discharge care    Destination    [facility] .   Service: Skilled Nursing Contact information: [address] [place] [phone]                   Signed: Mcarthur Rossetti 03/15/2020, 8:46 AM

## 2020-03-15 NOTE — Progress Notes (Signed)
Physical Therapy Treatment Patient Details Name: Melissa [name] MRN: [id number] DOB: 04/07/1951 Today's Date: 03/15/2020    History of Present Illness Patient is 69 y.o. female s/p Rt TKA on 03/08/20 with PMH significant for hypothyroidism, HTN, OA.    PT Comments    POD # 7 pm session Assisted with amb a functional distance.  Practiced stairs.  General stair comments: first practiced 3 steps/no rails using walker (front door) then practiced 6 steps/one rail (in home) all at 25% VC's on proper sequencing and safety. Reviewed HEP and use of ICE.  Addressed all mobility questions, discussed appropriate activity, educated on use of ICE.  Pt ready for D/C to home.   Follow Up Recommendations  SNF     Equipment Recommendations  Rolling walker with 5" wheels;3in1 (PT)    Recommendations for Other Services       Precautions / Restrictions Precautions Precautions: Fall Precaution Comments: instructed no pillow under knee Restrictions Weight Bearing Restrictions: No RLE Weight Bearing: Weight bearing as tolerated Other Position/Activity Restrictions: WBAT    Mobility  Bed Mobility               General bed mobility comments: OOB in recliner  Transfers Overall transfer level: Needs assistance Equipment used: Rolling walker (2 wheeled) Transfers: Sit to/from [facility] Sit to Stand: Supervision Stand pivot transfers: Supervision       General transfer comment: increased time  Ambulation/Gait Ambulation/Gait assistance: Supervision Gait Distance (Feet): 55 Feet Assistive device: Rolling walker (2 wheeled) Gait Pattern/deviations: Step-to pattern;Decreased step length - left;Decreased stance time - right;Decreased stride length;Decreased weight shift to right Gait velocity: decreased   General Gait Details: tolerated a functional distance   Stairs Stairs: Yes Stairs assistance: Min assist   Number of Stairs: 6 General stair comments: first  practiced 3 steps/no rails using walker (front door) then practiced 6 steps/one rail (in home) all at 25% VC's on proper sequencing and safety.   Wheelchair Mobility    Modified Rankin (Stroke Patients Only)       Balance                                            Cognition Arousal/Alertness: Awake/alert Behavior During Therapy: WFL for tasks assessed/performed Overall Cognitive Status: Within Functional Limits for tasks assessed                                 General Comments: pt going home now as her insurance continues to decline SNF Rehab      Exercises      General Comments        Pertinent Vitals/Pain Pain Assessment: 0-10 Pain Score: 3  Pain Location: R knee Pain Descriptors / Indicators: Tightness Pain Intervention(s): Premedicated before session;Monitored during session;Ice applied;Repositioned    Home Living                      Prior Function            PT Goals (current goals can now be found in the care plan section) Progress towards PT goals: Progressing toward goals    Frequency    7X/week      PT Plan Current plan remains appropriate    Co-evaluation  AM-PAC PT "6 Clicks" Mobility   Outcome Measure  Help needed turning from your back to your side while in a flat bed without using bedrails?: None Help needed moving from lying on your back to sitting on the side of a flat bed without using bedrails?: A Little Help needed moving to and from a bed to a chair (including a wheelchair)?: A Little Help needed standing up from a chair using your arms (e.g., wheelchair or bedside chair)?: A Little Help needed to walk in hospital room?: A Little Help needed climbing 3-5 steps with a railing? : A Lot 6 Click Score: 18    End of Session Equipment Utilized During Treatment: Gait belt Activity Tolerance: Patient tolerated treatment well Patient left: in chair;with call bell/phone  within reach Nurse Communication: Mobility status PT Visit Diagnosis: Muscle weakness (generalized) (M62.81);Difficulty in walking, not elsewhere classified (R26.2)     Time: [id number] PT Time Calculation (min) (ACUTE ONLY): 26 min  Charges:  $Gait Training: 8-22 mins $Self Care/Home Management: [place]  PTA Acute  Rehabilitation Services Pager      [phone] Office      [phone]

## 2020-03-15 NOTE — Progress Notes (Signed)
Occupational Therapy Treatment Patient Details Name: Melissa [name] MRN: [id number] DOB: [birth year] Today's Date: 03/15/2020    History of present illness Patient is 69 y.o. female s/p Rt TKA on 03/08/20 with PMH significant for hypothyroidism, HTN, OA.   OT comments  Insurance has denied SNF placement and patient going home with [name]. [place] reports having assistance of neighbor and brother to assist next week. Today's treatment focused on dressing and home safety education. Patient demonstrated ability to perform ADLs without assistance or adaptive equipment. Patient reports practicing tub shower with PT and independently bathing. From an OT stand point patient independent with basic ADLs and has no further OT needs.  Follow Up Recommendations       Equipment Recommendations  3 in 1 bedside commode    Recommendations for Other Services      Precautions / Restrictions Precautions Precautions: Fall Restrictions Weight Bearing Restrictions: No RLE Weight Bearing: Weight bearing as tolerated       Mobility Bed Mobility               General bed mobility comments: patient demonstrates independence with in room ambulation in room with RW with no LOB. Therapist provided patient with walker bag for home use. Patient educated on home environment safety including picking up throw rugs, using walker bag to keep hands on walker, assistance from family/friends for IADLs.  Transfers                      Balance                                           ADL either performed or assessed with clinical judgement   ADL           Upper Body Bathing: Independent Upper Body Bathing Details (indicate cue type and reason): Patient s/p shower. Independent for upper body bathing per patient report. Lower Body Bathing: Independent Lower Body Bathing Details (indicate cue type and reason): Patient s/p shower. patient independent with lower body bathing per  patient report. Upper Body Dressing : Independent Upper Body Dressing Details (indicate cue type and reason): patient demonstrated ability to donn shirt. Lower Body Dressing: Independent Lower Body Dressing Details (indicate cue type and reason): Patient demonstrated ability to donn pants, socks and shoes without assistance or adaptive equipment. Patient does not want AE.           Tub/Shower Transfer Details (indicate cue type and reason): Patient reports performing shower transfer with PT. Therapist and patient discussed eqipment need for tub and decided on 3 in 1 commode. Functional mobility during ADLs: Independent       Vision       Perception     Praxis      Cognition Arousal/Alertness: Awake/alert Behavior During Therapy: WFL for tasks assessed/performed Overall Cognitive Status: Within Functional Limits for tasks assessed                                          Exercises     Shoulder Instructions       General Comments      Pertinent Vitals/ Pain       Pain Assessment: 0-10 Pain Score: 3  Pain Location: R knee Pain Descriptors / Indicators: Aching Pain  Intervention(s): Monitored during session  Home Living                                          Prior Functioning/Environment              Frequency           Progress Toward Goals  OT Goals(current goals can now be found in the care plan section)        Plan      Co-evaluation                 AM-PAC OT "6 Clicks" Daily Activity     Outcome Measure   Help from another person eating meals?: None Help from another person taking care of personal grooming?: None Help from another person toileting, which includes using toliet, bedpan, or urinal?: None Help from another person bathing (including washing, rinsing, drying)?: A Little Help from another person to put on and taking off regular upper body clothing?: None Help from another person to  put on and taking off regular lower body clothing?: None 6 Click Score: 23    End of Session Equipment Utilized During Treatment: Rolling walker CPM Right Knee CPM Right Knee: Off  OT Visit Diagnosis: Other abnormalities of gait and mobility (R26.89)   Activity Tolerance Patient tolerated treatment well   Patient Left in chair;with call bell/phone within reach;Other (comment)(legs elevated, knee in extension)   Nurse Communication Other (comment)(patients status)        Time: [id number] OT Time Calculation (min): 9 min  Charges: OT Treatments $Self Care/Home Management : 8-22 mins  Derl Barrow, OTR/L Acute Care Rehab Services  Office [phone]    Lenward Chancellor 03/15/2020, 11:01 AM

## 2020-03-16 DIAGNOSIS — Z87891 Personal history of nicotine dependence: Secondary | ICD-10-CM | POA: Diagnosis not present

## 2020-03-16 DIAGNOSIS — R7303 Prediabetes: Secondary | ICD-10-CM | POA: Diagnosis not present

## 2020-03-16 DIAGNOSIS — I10 Essential (primary) hypertension: Secondary | ICD-10-CM | POA: Diagnosis not present

## 2020-03-16 DIAGNOSIS — Z471 Aftercare following joint replacement surgery: Secondary | ICD-10-CM | POA: Diagnosis not present

## 2020-03-16 DIAGNOSIS — Z7982 Long term (current) use of aspirin: Secondary | ICD-10-CM | POA: Diagnosis not present

## 2020-03-16 DIAGNOSIS — E039 Hypothyroidism, unspecified: Secondary | ICD-10-CM | POA: Diagnosis not present

## 2020-03-16 DIAGNOSIS — Z96651 Presence of right artificial knee joint: Secondary | ICD-10-CM | POA: Diagnosis not present

## 2020-03-16 DIAGNOSIS — M1712 Unilateral primary osteoarthritis, left knee: Secondary | ICD-10-CM | POA: Diagnosis not present

## 2020-03-16 DIAGNOSIS — Z7984 Long term (current) use of oral hypoglycemic drugs: Secondary | ICD-10-CM | POA: Diagnosis not present

## 2020-03-16 DIAGNOSIS — Z9181 History of falling: Secondary | ICD-10-CM | POA: Diagnosis not present

## 2020-03-18 ENCOUNTER — Telehealth: Payer: Self-pay | Admitting: Orthopaedic Surgery

## 2020-03-18 ENCOUNTER — Telehealth: Payer: Self-pay | Admitting: Orthopedic Surgery

## 2020-03-18 ENCOUNTER — Other Ambulatory Visit: Payer: Self-pay | Admitting: Orthopaedic Surgery

## 2020-03-18 DIAGNOSIS — Z9181 History of falling: Secondary | ICD-10-CM | POA: Diagnosis not present

## 2020-03-18 DIAGNOSIS — Z96651 Presence of right artificial knee joint: Secondary | ICD-10-CM | POA: Diagnosis not present

## 2020-03-18 DIAGNOSIS — E039 Hypothyroidism, unspecified: Secondary | ICD-10-CM | POA: Diagnosis not present

## 2020-03-18 DIAGNOSIS — Z471 Aftercare following joint replacement surgery: Secondary | ICD-10-CM | POA: Diagnosis not present

## 2020-03-18 DIAGNOSIS — M1712 Unilateral primary osteoarthritis, left knee: Secondary | ICD-10-CM | POA: Diagnosis not present

## 2020-03-18 DIAGNOSIS — Z7984 Long term (current) use of oral hypoglycemic drugs: Secondary | ICD-10-CM | POA: Diagnosis not present

## 2020-03-18 DIAGNOSIS — Z87891 Personal history of nicotine dependence: Secondary | ICD-10-CM | POA: Diagnosis not present

## 2020-03-18 DIAGNOSIS — R7303 Prediabetes: Secondary | ICD-10-CM | POA: Diagnosis not present

## 2020-03-18 DIAGNOSIS — I10 Essential (primary) hypertension: Secondary | ICD-10-CM | POA: Diagnosis not present

## 2020-03-18 DIAGNOSIS — Z7982 Long term (current) use of aspirin: Secondary | ICD-10-CM | POA: Diagnosis not present

## 2020-03-18 MED ORDER — OXYCODONE HCL 5 MG PO TABS: 5.0000 mg | ORAL_TABLET | ORAL | 0 refills | Status: DC | PRN

## 2020-03-18 NOTE — Telephone Encounter (Signed)
Darnelle needs verbal P.T. orders for this patient. Starting once a week x 1 week and then twice a week x 2 weeks to focus on strength, ROM, and independence with mobility.  Please call [place] at [phone]

## 2020-03-18 NOTE — Telephone Encounter (Signed)
Patient called. She has some questions about her medication. Would like for someone to call her. [phone]

## 2020-03-18 NOTE — Telephone Encounter (Signed)
I did send in some oxycodone today.  Find out what other question she has please about her medications and let me know.  Thanks

## 2020-03-18 NOTE — Telephone Encounter (Signed)
Asking for refill of oxycodone

## 2020-03-18 NOTE — Telephone Encounter (Signed)
Verbal order given  

## 2020-03-21 ENCOUNTER — Inpatient Hospital Stay: Payer: Medicare HMO | Admitting: Orthopaedic Surgery

## 2020-03-21 DIAGNOSIS — R7303 Prediabetes: Secondary | ICD-10-CM | POA: Diagnosis not present

## 2020-03-21 DIAGNOSIS — I10 Essential (primary) hypertension: Secondary | ICD-10-CM | POA: Diagnosis not present

## 2020-03-21 DIAGNOSIS — Z471 Aftercare following joint replacement surgery: Secondary | ICD-10-CM | POA: Diagnosis not present

## 2020-03-21 DIAGNOSIS — Z96651 Presence of right artificial knee joint: Secondary | ICD-10-CM | POA: Diagnosis not present

## 2020-03-21 DIAGNOSIS — E039 Hypothyroidism, unspecified: Secondary | ICD-10-CM | POA: Diagnosis not present

## 2020-03-21 DIAGNOSIS — Z7984 Long term (current) use of oral hypoglycemic drugs: Secondary | ICD-10-CM | POA: Diagnosis not present

## 2020-03-21 DIAGNOSIS — M1712 Unilateral primary osteoarthritis, left knee: Secondary | ICD-10-CM | POA: Diagnosis not present

## 2020-03-21 DIAGNOSIS — Z7982 Long term (current) use of aspirin: Secondary | ICD-10-CM | POA: Diagnosis not present

## 2020-03-21 DIAGNOSIS — Z9181 History of falling: Secondary | ICD-10-CM | POA: Diagnosis not present

## 2020-03-21 DIAGNOSIS — Z87891 Personal history of nicotine dependence: Secondary | ICD-10-CM | POA: Diagnosis not present

## 2020-03-25 ENCOUNTER — Other Ambulatory Visit: Payer: Self-pay

## 2020-03-25 ENCOUNTER — Encounter: Payer: Self-pay | Admitting: Orthopaedic Surgery

## 2020-03-25 ENCOUNTER — Ambulatory Visit (INDEPENDENT_AMBULATORY_CARE_PROVIDER_SITE_OTHER): Payer: Medicare HMO | Admitting: Orthopaedic Surgery

## 2020-03-25 DIAGNOSIS — Z96651 Presence of right artificial knee joint: Secondary | ICD-10-CM

## 2020-03-25 NOTE — Progress Notes (Signed)
The patient is now 2 weeks tomorrow status post a right total knee arthroplasty.  She has been having home therapy and is making progress.  She is ambulating with a walker today but does use a cane.  On exam her extension is almost full of her right knee.  She can flex to just past 90 degrees.  Her staple line looks good so remove the staples in place Steri-Strips.  Her calf is soft.  At this point she is doing excellent with her home therapy.  We will transition her to outpatient therapy to maximize the mobility and range of motion and strengthening of her right knee with also working on her balance and coordination.  All questions and concerns were answered and addressed.  She does work at [employer] and would like to resume some inpatient work starting 17 May which I agree with and gave her a note for.  She is not taking any pain medication right now she states.  We will see her back in 4 weeks to see how she is done with outpatient physical therapy.  We will work on getting this set up.

## 2020-03-26 DIAGNOSIS — I10 Essential (primary) hypertension: Secondary | ICD-10-CM | POA: Diagnosis not present

## 2020-03-26 DIAGNOSIS — E039 Hypothyroidism, unspecified: Secondary | ICD-10-CM | POA: Diagnosis not present

## 2020-03-26 DIAGNOSIS — Z9181 History of falling: Secondary | ICD-10-CM | POA: Diagnosis not present

## 2020-03-26 DIAGNOSIS — M1712 Unilateral primary osteoarthritis, left knee: Secondary | ICD-10-CM | POA: Diagnosis not present

## 2020-03-26 DIAGNOSIS — Z7982 Long term (current) use of aspirin: Secondary | ICD-10-CM | POA: Diagnosis not present

## 2020-03-26 DIAGNOSIS — Z471 Aftercare following joint replacement surgery: Secondary | ICD-10-CM | POA: Diagnosis not present

## 2020-03-26 DIAGNOSIS — R7303 Prediabetes: Secondary | ICD-10-CM | POA: Diagnosis not present

## 2020-03-26 DIAGNOSIS — Z96651 Presence of right artificial knee joint: Secondary | ICD-10-CM | POA: Diagnosis not present

## 2020-03-26 DIAGNOSIS — Z7984 Long term (current) use of oral hypoglycemic drugs: Secondary | ICD-10-CM | POA: Diagnosis not present

## 2020-03-26 DIAGNOSIS — Z87891 Personal history of nicotine dependence: Secondary | ICD-10-CM | POA: Diagnosis not present

## 2020-03-28 DIAGNOSIS — E039 Hypothyroidism, unspecified: Secondary | ICD-10-CM | POA: Diagnosis not present

## 2020-03-28 DIAGNOSIS — Z7984 Long term (current) use of oral hypoglycemic drugs: Secondary | ICD-10-CM | POA: Diagnosis not present

## 2020-03-28 DIAGNOSIS — Z471 Aftercare following joint replacement surgery: Secondary | ICD-10-CM | POA: Diagnosis not present

## 2020-03-28 DIAGNOSIS — Z87891 Personal history of nicotine dependence: Secondary | ICD-10-CM | POA: Diagnosis not present

## 2020-03-28 DIAGNOSIS — M1712 Unilateral primary osteoarthritis, left knee: Secondary | ICD-10-CM | POA: Diagnosis not present

## 2020-03-28 DIAGNOSIS — Z7982 Long term (current) use of aspirin: Secondary | ICD-10-CM | POA: Diagnosis not present

## 2020-03-28 DIAGNOSIS — R7303 Prediabetes: Secondary | ICD-10-CM | POA: Diagnosis not present

## 2020-03-28 DIAGNOSIS — Z96651 Presence of right artificial knee joint: Secondary | ICD-10-CM | POA: Diagnosis not present

## 2020-03-28 DIAGNOSIS — I10 Essential (primary) hypertension: Secondary | ICD-10-CM | POA: Diagnosis not present

## 2020-03-28 DIAGNOSIS — Z9181 History of falling: Secondary | ICD-10-CM | POA: Diagnosis not present

## 2020-04-08 ENCOUNTER — Ambulatory Visit: Payer: Medicare HMO

## 2020-04-10 ENCOUNTER — Other Ambulatory Visit: Payer: Self-pay

## 2020-04-10 ENCOUNTER — Ambulatory Visit: Payer: Medicare HMO | Attending: Orthopaedic Surgery

## 2020-04-10 DIAGNOSIS — R2689 Other abnormalities of gait and mobility: Secondary | ICD-10-CM | POA: Diagnosis not present

## 2020-04-10 DIAGNOSIS — R600 Localized edema: Secondary | ICD-10-CM | POA: Diagnosis not present

## 2020-04-10 DIAGNOSIS — M25561 Pain in right knee: Secondary | ICD-10-CM | POA: Insufficient documentation

## 2020-04-10 DIAGNOSIS — M25661 Stiffness of right knee, not elsewhere classified: Secondary | ICD-10-CM | POA: Insufficient documentation

## 2020-04-10 NOTE — Therapy (Signed)
[facility] [address] [place], [place], [address] Phone: [phone]   Fax:  [phone]  Physical Therapy Evaluation  Patient Details  Name: Melissa [name] MRN: [id number] Date of Birth: 07/08/1951 Referring Provider (PT): Jean Rosenthal, MD   Encounter Date: 04/10/2020  PT End of Session - 04/10/20 1309    Visit Number  1    Date for PT Re-Evaluation  06/05/20    Authorization Type  [employer] Medicare    PT Start Time  1233    PT Stop Time  1315    PT Time Calculation (min)  42 min    Activity Tolerance  Patient tolerated treatment well    Behavior During Therapy  [facility] for tasks assessed/performed       Past Medical History:  Diagnosis Date  . Arthritis    osteo arthritis  . Hypertension   . Hypothyroidism   . Pre-diabetes    Takes metformin  . Walking pneumonia     Past Surgical History:  Procedure Laterality Date  . MENISCUS REPAIR     right knee  . MULTIPLE TOOTH EXTRACTIONS     with implants  . ovaries removed     2003  . TONSILLECTOMY    . TOTAL KNEE ARTHROPLASTY Right 03/08/2020   Procedure: RIGHT TOTAL KNEE ARTHROPLASTY AND LEFT KNEE STEROID INJECTION;  Surgeon: Mcarthur Rossetti, MD;  Location: [facility];  Service: Orthopedics;  Laterality: Right;    There were no vitals filed for this visit.   Subjective Assessment - 04/10/20 1237    Subjective  Pt presents to PT ~1 month s/p Rt TKA.  Pt had home health PT that ended ~2 weeks ago.  Pt has returned to driving.    How long can you stand comfortably?  < 1 hour    How long can you walk comfortably?  <1 hour    Patient Stated Goals  Reduce pain, improve knee strength and gait    Currently in Pain?  Yes    Pain Score  3     Pain Location  Knee    Pain Orientation  Right    Pain Descriptors / Indicators  Aching;Tightness    Pain Type  Surgical pain    Pain Onset  More than a month ago    Pain Frequency  Constant    Aggravating  Factors   standing and walking, activity    Pain Relieving Factors  ice, elevation, rest, Advil         [facility] PT Assessment - 04/10/20 0001      Assessment   Medical Diagnosis  s/p total Rt knee replacement    Referring Provider (PT)  Jean Rosenthal, MD    Onset Date/Surgical Date  03/08/20    Next MD Visit  04/25/20    Prior Therapy  home health PT      Precautions   Precautions  None      Restrictions   Weight Bearing Restrictions  No      Balance Screen   Has the patient fallen in the past 6 months  No    Has the patient had a decrease in activity level because of a fear of falling?   No    Is the patient reluctant to leave their home because of a fear of falling?   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      Cognition   Overall Cognitive Status  Within Functional Limits for tasks assessed      Observation/Other Assessments   Focus on Therapeutic Outcomes (FOTO)   37% limitation      Posture/Postural Control   Posture/Postural Control  No significant limitations      ROM / Strength   AROM / PROM / Strength  AROM;PROM;Strength      AROM   Overall AROM   Deficits    AROM Assessment Site  Knee    Right/Left Knee  Right    Right Knee Extension  3    Right Knee Flexion  115      PROM   Overall PROM   Within functional limits for tasks performed    Overall PROM Comments  Rt knee 0-118      Strength   Overall Strength  Deficits    Overall Strength Comments  Lt knee 4+/5 (OA), Rt hip 4 to 4+/5    Strength Assessment Site  Knee    Right/Left Knee  Right    Right Knee Flexion  4+/5    Right Knee Extension  4+/5      Palpation   Patella mobility  good  patellar mobility on the Rt    Palpation comment  warmth and edema, well healing surgical incision, tender at distal quads and hamstrings on the Rt      Transfers   Transfers  Independent with all Transfers      Ambulation/Gait   Ambulation/Gait  Yes    Gait Pattern  Step-through pattern;Antalgic    Stairs  Yes    Stair Management Technique  One rail Right;Alternating pattern    Number of Stairs  4    Gait Comments  reduced eccentric control with descending steps and ER of Rt hip                   Objective measurements completed on examination: See above findings.      [place] Adult PT Treatment/Exercise - 04/10/20 0001      Exercises   Exercises  Knee/Hip      Knee/Hip Exercises: Aerobic   Nustep  Level 2x 6 minutes      Knee/Hip Exercises: Standing   Step Down  Right;1 set;5 sets   Pt will do this at home     Modalities   Modalities  Vasopneumatic      Vasopneumatic   Number Minutes Vasopneumatic   10 minutes    Vasopnuematic Location   Knee    Vasopneumatic Temperature   3 snowflakes               PT Short Term Goals - 04/10/20 1319      PT SHORT TERM GOAL #1   Title  be independent in initial HEP    Time  4    Period  Weeks    Status  New    Target Date  05/08/20      PT SHORT TERM GOAL #2   Title  report < or = to 3/10 Rt knee pain with standing and walking    Time  4  Period  Weeks    Status  New    Target Date  05/08/20      PT SHORT TERM GOAL #3   Title  improve hip and knee endurance to walk for 45 minutes in the community without limitation    Time  4    Period  Weeks    Status  New    Target Date  05/08/20        PT Long Term Goals - 04/10/20 1320      PT LONG TERM GOAL #1   Title  be independent in advanced HEP    Time  8    Period  Weeks    Status  New    Target Date  06/05/20      PT LONG TERM GOAL #2   Title  reduce FOTO to < or = to 30% limitation    Time  8    Period  Weeks    Status  New    Target  Date  06/05/20      PT LONG TERM GOAL #3   Title  improve Rt LE strength to descend steps with good eccentric control    Time  8    Period  Weeks    Status  New    Target Date  06/05/20      PT LONG TERM GOAL #4   Title  report a 70% reduction in Rt knee pain to improve tolerance for standing and walking    Time  8    Period  Weeks    Status  New    Target Date  06/05/20      PT LONG TERM GOAL #5   Title  improve endurance to ambulate for > or = to 1 hour in the community without limitation    Time  8    Period  Weeks    Status  New    Target Date  06/05/20             Plan - 04/10/20 1326    Clinical Impression Statement  Pt presents to PT ~1 month s/p Rt TKA due to OA.  Pt with main compliant of Rt knee pain that limits function.  Pt with mild antalgic gait pattern on level surface and reduced eccentric quad control and hip ER with descending steps.  Pt demonstrates 3-115 degrees of Rt knee A/ROM and 4 to 4+/5 Rt hip and knee strength.  Pt with warmth and edema about the Rt knee joint and well healing surgical incision.  Pt will benefit from skilled PT to address Rt knee strength, endurance, gait edema and pain management to allow for return to prior level of function.    Personal Factors and Comorbidities  Comorbidity 1    Comorbidities  Rt TKA    Examination-Activity Limitations  Stairs;Stand;Squat    Examination-Participation Restrictions  Community Activity;Shop    Stability/Clinical Decision Making  Stable/Uncomplicated    Clinical Decision Making  Low    Rehab Potential  Excellent    PT Frequency  2x / week    PT Duration  8 weeks    PT Treatment/Interventions  ADLs/Self Care Home Management;Cryotherapy;Electrical Stimulation;Moist Heat;Functional mobility Arts administrator;Therapeutic activities;Therapeutic exercise;Balance training;Neuromuscular re-education;Patient/family education;Manual techniques;Passive range of motion;Taping;Vasopneumatic  Device;Joint Manipulations    PT Next Visit Plan  soft tissue to Rt knee and incision, edema management, hip and knee strength and stability    PT Home Exercise Plan  need to establish in  MedBridge.  Pt has HEP from home health    Consulted and Agree with Plan of Care  Patient       Patient will benefit from skilled therapeutic intervention in order to improve the following deficits and impairments:  Abnormal gait, Decreased activity tolerance, Pain, Impaired flexibility, Decreased strength, Difficulty walking, Increased edema, Decreased endurance  Visit Diagnosis: Acute pain of right knee - Plan: PT plan of care cert/re-cert  Stiffness of right knee, not elsewhere classified - Plan: PT plan of care cert/re-cert  Other abnormalities of gait and mobility - Plan: PT plan of care cert/re-cert  Localized edema - Plan: PT plan of care cert/re-cert     Problem List Patient Active Problem List   Diagnosis Date Noted  . Status post total right knee replacement 03/08/2020  . Pain in right foot 07/05/2017  . Chronic pain of right knee 07/05/2017  . Chronic pain of left knee 07/05/2017  . Unilateral primary osteoarthritis, left knee 07/05/2017  . Unilateral primary osteoarthritis, right knee 07/05/2017  . 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, right 04/14/2017    Sigurd Sos, PT 04/10/20 1:32 PM  [facility] [address], [place], [place], [address] Phone: [phone]   Fax:  [phone]  Name: LANORE DOOLIN MRN: [id number] Date of Birth: 1951/09/14

## 2020-04-16 ENCOUNTER — Ambulatory Visit: Payer: Medicare HMO

## 2020-04-19 ENCOUNTER — Ambulatory Visit: Payer: Medicare HMO | Admitting: Physical Therapy

## 2020-04-19 ENCOUNTER — Encounter: Payer: Self-pay | Admitting: Physical Therapy

## 2020-04-19 ENCOUNTER — Other Ambulatory Visit: Payer: Self-pay

## 2020-04-19 DIAGNOSIS — M25561 Pain in right knee: Secondary | ICD-10-CM | POA: Diagnosis not present

## 2020-04-19 DIAGNOSIS — M25661 Stiffness of right knee, not elsewhere classified: Secondary | ICD-10-CM | POA: Diagnosis not present

## 2020-04-19 DIAGNOSIS — R2689 Other abnormalities of gait and mobility: Secondary | ICD-10-CM

## 2020-04-19 DIAGNOSIS — R600 Localized edema: Secondary | ICD-10-CM | POA: Diagnosis not present

## 2020-04-19 NOTE — Patient Instructions (Signed)
Access Code: [facility] URL: [email or web address]: 05/28/2021Prepared by: Venetia Night BeuhringExercises  Squat with Chair Touch - 2 x daily - 7 x weekly - 3 sets - 10 reps  Seated Knee Extension with Resistance - 2 x daily - 7 x weekly - 3 sets - 10 reps  Step Up - 2 x daily - 7 x weekly - 2 sets - 10 reps  Standing Hip Extension with Counter Support - 2 x daily - 7 x weekly - 3 sets - 10 reps  Forward Step Down - 2 x daily - 7 x weekly - 3 sets - 10 reps  Gastroc Stretch on Wall - 2 x daily - 7 x weekly - 1 sets - 3 reps - 30 hold

## 2020-04-19 NOTE — Therapy (Signed)
[facility] [address], [place], [place], [address] Phone: [phone]   Fax:  [phone]  Physical Therapy Treatment  Patient Details  Name: Melissa [name] MRN: [id number] Date of Birth: Jan 06, 1951 Referring Provider (PT): Jean Rosenthal, MD   Encounter Date: 04/19/2020  PT End of Session - 04/19/20 1146    Visit Number  2    Date for PT Re-Evaluation  06/05/20    Authorization Type  [employer]na Medicare    PT Start Time  1100    PT Stop Time  1153    PT Time Calculation (min)  53 min    Activity Tolerance  Patient tolerated treatment well    Behavior During Therapy  [facility] for tasks assessed/performed       Past Medical History:  Diagnosis Date  . Arthritis    osteo arthritis  . Hypertension   . Hypothyroidism   . Pre-diabetes    Takes metformin  . Walking pneumonia     Past Surgical History:  Procedure Laterality Date  . MENISCUS REPAIR     right knee  . MULTIPLE TOOTH EXTRACTIONS     with implants  . ovaries removed     2003  . TONSILLECTOMY    . TOTAL KNEE ARTHROPLASTY Right 03/08/2020   Procedure: RIGHT TOTAL KNEE ARTHROPLASTY AND LEFT KNEE STEROID INJECTION;  Surgeon: Mcarthur Rossetti, MD;  Location: [facility];  Service: Orthopedics;  Laterality: Right;    There were no vitals filed for this visit.  Subjective Assessment - 04/19/20 1059    Subjective  My Rt ankle is swollen but doesn't hurt.  I'm working on alt pattern coming down the stairs.    How long can you stand comfortably?  < 1 hour    How long can you walk comfortably?  <1 hour    Patient Stated Goals  Reduce pain, improve knee strength and gait    Currently in Pain?  Yes    Pain Score  3     Pain Location  Knee    Pain Orientation  Right    Pain Descriptors / Indicators  Aching;Tightness    Pain Type  Surgical pain    Pain Onset  More than a month ago    Pain Frequency  Intermittent    Aggravating Factors   stand, walk     Pain Relieving Factors  ice, elevation rest, Advil                        [facility] Adult PT Treatment/Exercise - 04/19/20 0001      Self-Care   Self-Care  Scar Mobilizations      Knee/Hip Exercises: Stretches   Gastroc Stretch  Right;2 reps;30 seconds    Gastroc Stretch Limitations  slant board x 1, wall runners stretch for HEP x 1      Knee/Hip Exercises: Aerobic   Nustep  Level 2x 6 minutes      Knee/Hip Exercises: Standing   Hip Extension  Stengthening;Right;Knee straight;15 reps    Extension Limitations  bil UE support    Forward Step Up  2 sets;10 reps;Hand Hold: 2;Step Height: 6"    Forward Step Up Limitations  march Lt LE to 3rd step    Step Down  Right;Step Height: 6";10 reps;Hand Hold: 2    Functional Squat  2 sets;10 reps    Functional Squat Limitations  holding 5lb, squat to chair touch  Knee/Hip Exercises: Seated   Long Arc Quad  Strengthening;Right;2 sets;15 reps    [employer] Limitations  red band, PT cued Pt to avoid backward trunk lean      Knee/Hip Exercises: Supine   Straight Leg Raises  Right;10 reps    Straight Leg Raises Limitations  2lb      Knee/Hip Exercises: Sidelying   Hip ABduction  Strengthening;Right;10 reps    Hip ABduction Limitations  2lb      Modalities   Modalities  Vasopneumatic      Vasopneumatic   Number Minutes Vasopneumatic   10 minutes    Vasopnuematic Location   Knee   rT   Vasopneumatic Pressure  Low    Vasopneumatic Temperature   3 snowflakes      Manual Therapy   Manual Therapy  Passive ROM;Soft tissue mobilization;Joint mobilization    Joint Mobilization  Gr III sup/inf/med/lat patellar mobs    Soft tissue mobilization  scar mobs with PT instruction for Pt for home self-scar mobs    Passive ROM  Rt knee flexion/ext x 15 reps             PT Education - 04/19/20 1140    Education Details  Access Code: [email or web address]    Person(s) Educated  Patient    Methods  Explanation;Handout;Demonstration     Comprehension  Verbalized understanding;Returned demonstration       PT Short Term Goals - 04/19/20 1152      PT SHORT TERM GOAL #1   Title  be independent in initial HEP    Status  On-going        PT Long Term Goals - 04/10/20 1320      PT LONG TERM GOAL #1   Title  be independent in advanced HEP    Time  8    Period  Weeks    Status  New    Target Date  06/05/20      PT LONG TERM GOAL #2   Title  reduce FOTO to < or = to 30% limitation    Time  8    Period  Weeks    Status  New    Target Date  06/05/20      PT LONG TERM GOAL #3   Title  improve Rt LE strength to descend steps with good eccentric control    Time  8    Period  Weeks    Status  New    Target Date  06/05/20      PT LONG TERM GOAL #4   Title  report a 70% reduction in Rt knee pain to improve tolerance for standing and walking    Time  8    Period  Weeks    Status  New    Target Date  06/05/20      PT LONG TERM GOAL #5   Title  improve endurance to ambulate for > or = to 1 hour in the community without limitation    Time  8    Period  Weeks    Status  New    Target Date  06/05/20            Plan - 04/19/20 1148    Clinical Impression Statement  Pt arrived with concern about Rt ankle swelling and wondered if it was from working on stair step downs.  PT assessed and there was no warmth, redness, pitting and only mild edema which PT explained may be from  increased activity and pooling of some edema from knee into ankle.  Pt had some anterior ankle pain with step downs but this reduced after gastroc stretching.  PT initiated Medbridge to advance HEP with good tolerance by Pt.  PT instructed in self-scar mobs and demo'd on Pt.  Limited access to knee directly due to tighter fitting pant legs today.  Pt with restriction in patellar mobs which improved with joint mobs today by PT.  Pt will continue to benefit from skilled progression for improved mobility and strength of Rt LE following Rt TKR.     Comorbidities  Rt TKA    Rehab Potential  Excellent    PT Frequency  2x / week    PT Duration  8 weeks    PT Treatment/Interventions  ADLs/Self Care Home Management;Cryotherapy;Electrical Stimulation;Moist Heat;Functional mobility Arts administrator;Therapeutic activities;Therapeutic exercise;Balance training;Neuromuscular re-education;Patient/family education;Manual techniques;Passive range of motion;Taping;Vasopneumatic Device;Joint Manipulations    PT Next Visit Plan  f/u on new HEP Medbridge, gastroc stretch, did Pt try scar mobs, soft tissue to Rt knee and incision, edema management, hip and knee strength and stability    PT Home Exercise Plan  Access Code: [email or web address]    Consulted and Agree with Plan of Care  Patient       Patient will benefit from skilled therapeutic intervention in order to improve the following deficits and impairments:     Visit Diagnosis: Acute pain of right knee  Stiffness of right knee, not elsewhere classified  Other abnormalities of gait and mobility  Localized edema     Problem List Patient Active Problem List   Diagnosis Date Noted  . Status post total right knee replacement 03/08/2020  . Pain in right foot 07/05/2017  . Chronic pain of right knee 07/05/2017  . Chronic pain of left knee 07/05/2017  . Unilateral primary osteoarthritis, left knee 07/05/2017  . Unilateral primary osteoarthritis, right knee 07/05/2017  . 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, right 04/14/2017    Baruch Merl, PT 04/19/20 11:52 AM   [facility] [address] [place], [place], [address] Phone: [phone]   Fax:  [phone]  Name: Melissa [name] MRN: [id number] Date of Birth: [birth year]

## 2020-04-23 ENCOUNTER — Ambulatory Visit (INDEPENDENT_AMBULATORY_CARE_PROVIDER_SITE_OTHER): Payer: Medicare HMO | Admitting: Orthopaedic Surgery

## 2020-04-23 ENCOUNTER — Encounter: Payer: Self-pay | Admitting: Orthopaedic Surgery

## 2020-04-23 ENCOUNTER — Other Ambulatory Visit: Payer: Self-pay

## 2020-04-23 VITALS — Ht 64.0 in | Wt 209.0 lb

## 2020-04-23 DIAGNOSIS — Z96651 Presence of right artificial knee joint: Secondary | ICD-10-CM

## 2020-04-23 DIAGNOSIS — M1712 Unilateral primary osteoarthritis, left knee: Secondary | ICD-10-CM

## 2020-04-23 NOTE — Progress Notes (Signed)
The patient is now 6-week status post a right total knee arthroplasty.  She is doing very well with that knee.  She has known end-stage arthritis of her left knee.  At this point she hopes to proceed with a left total knee arthroplasty at the end of next month.  At the time of her surgery 6 weeks ago, we did place a steroid injection of the left knee to help temporize her pain.  She has severe arthritis in that left knee and hurts her quite a bit.  She has made excellent progress the right knee.  On examination of her right knee, the incisions healed nicely.  There is some mild swelling of the knee itself.  Her range of motion is entirely full and the right knee is ligamentously stable.  Her left knee does show varus malalignment.  There is medial lateral joint line tenderness.  There is patellofemoral crepitation and significant pain throughout the arc of motion of her left knee.  Her x-rays correspond with severe end-stage arthritis of the left knee with peritubular osteophytes in all 3 compartments, varus malalignment and complete loss with severe narrowing of the medial compartment.  At this point given successful right total knee arthroplasty which she wishes to go and proceed with a left total knee arthroplasty in late July of this year.  Having had this done before she is fully aware of the risk and benefits of knee replacement surgery.  Given the failure conservative treatment on her arthritic left knee this is the next step.  We will work on getting this scheduled.  All questions and concerns were answered and addressed.

## 2020-04-25 ENCOUNTER — Other Ambulatory Visit: Payer: Self-pay

## 2020-04-25 ENCOUNTER — Encounter: Payer: Self-pay | Admitting: Physical Therapy

## 2020-04-25 ENCOUNTER — Ambulatory Visit: Payer: Medicare HMO | Attending: Orthopaedic Surgery | Admitting: Physical Therapy

## 2020-04-25 DIAGNOSIS — M25561 Pain in right knee: Secondary | ICD-10-CM | POA: Diagnosis not present

## 2020-04-25 DIAGNOSIS — M25661 Stiffness of right knee, not elsewhere classified: Secondary | ICD-10-CM | POA: Insufficient documentation

## 2020-04-25 DIAGNOSIS — R2689 Other abnormalities of gait and mobility: Secondary | ICD-10-CM

## 2020-04-25 DIAGNOSIS — R600 Localized edema: Secondary | ICD-10-CM | POA: Diagnosis not present

## 2020-04-25 NOTE — Therapy (Signed)
[facility] [address] [place], [place], [address] Phone: [phone]   Fax:  [phone]  Physical Therapy Treatment  Patient Details  Name: Melissa [name] MRN: [id number] Date of Birth: [birth year] Referring Provider (PT): Jean Rosenthal, MD   Encounter Date: 04/25/2020  PT End of Session - 04/25/20 1059    Visit Number  3    Date for PT Re-Evaluation  06/05/20    Authorization Type  [employer]na Medicare    PT Start Time  [id number]    PT Stop Time  1105    PT Time Calculation (min)  50 min    Activity Tolerance  Patient tolerated treatment well       Past Medical History:  Diagnosis Date  . Arthritis    osteo arthritis  . Hypertension   . Hypothyroidism   . Pre-diabetes    Takes metformin  . Walking pneumonia     Past Surgical History:  Procedure Laterality Date  . MENISCUS REPAIR     right knee  . MULTIPLE TOOTH EXTRACTIONS     with implants  . ovaries removed     2003  . TONSILLECTOMY    . TOTAL KNEE ARTHROPLASTY Right 03/08/2020   Procedure: RIGHT TOTAL KNEE ARTHROPLASTY AND LEFT KNEE STEROID INJECTION;  Surgeon: Mcarthur Rossetti, MD;  Location: [facility];  Service: Orthopedics;  Laterality: Right;    There were no vitals filed for this visit.  Subjective Assessment - 04/25/20 1015    Subjective  I saw the orthopedist and he said I'm getting better ahead of schedule.  My right knee is not painful just stiff.   Stiff in the mornings and after sitting all day for Zoom calls.   My other knee is the problem and I'm going to have that replaced in July.    Currently in Pain?  No/denies    Pain Score  0-No pain    Pain Location  Knee    Pain Orientation  Right    Aggravating Factors   hard to bend leg up in the shower to clean my feet; descending stairs         [facility] PT Assessment - 04/25/20 0001      AROM   Right Knee Extension  2    Right Knee Flexion  118                     [facility] Adult PT Treatment/Exercise - 04/25/20 0001      Knee/Hip Exercises: Stretches   Active Hamstring Stretch  Right;5 reps    Active Hamstring Stretch Limitations  on 2nd step    Other Knee/Hip Stretches  2nd step flexion stretch 15x on right       Knee/Hip Exercises: Aerobic   Nustep  Level 2x 6 minutes      Knee/Hip Exercises: Machines for Strengthening   Cybex Leg Press  seat 8 75# bil 15x; right only 35# 30x       Knee/Hip Exercises: Standing   Forward Step Up  Right;10 reps;Hand Hold: 0;Step Height: 6"    Step Down  Right;15 reps;Step Height: 2";Step Height: 4"    Step Down Limitations  discussed a small book as target for home since her steps are higher    Other Standing Knee Exercises  1st and 2nd step taps 10x weight bear on right       Knee/Hip Exercises: Seated   Long Arc Quad  Strengthening;Right;15 reps  Long Arc Quad Weight  3 lbs.    Other Seated Knee/Hip Exercises  small arc lifts heel off stool 15x     Sit to Sand  10 reps;without UE support      Vasopneumatic   Number Minutes Vasopneumatic   10 minutes    Vasopnuematic Location   Knee   rT   Vasopneumatic Pressure  Medium    Vasopneumatic Temperature   3 snowflakes      Manual Therapy   Joint Mobilization  patellar mobs, scar mobs     Passive ROM  Rt knee flexion/ext x 15 reps with overpressure in supine and seated                PT Short Term Goals - 04/19/20 1152      PT SHORT TERM GOAL #1   Title  be independent in initial HEP    Status  On-going        PT Long Term Goals - 04/10/20 1320      PT LONG TERM GOAL #1   Title  be independent in advanced HEP    Time  8    Period  Weeks    Status  New    Target Date  06/05/20      PT LONG TERM GOAL #2   Title  reduce FOTO to < or = to 30% limitation    Time  8    Period  Weeks    Status  New    Target Date  06/05/20      PT LONG TERM GOAL #3   Title  improve Rt LE strength to descend steps with  good eccentric control    Time  8    Period  Weeks    Status  New    Target Date  06/05/20      PT LONG TERM GOAL #4   Title  report a 70% reduction in Rt knee pain to improve tolerance for standing and walking    Time  8    Period  Weeks    Status  New    Target Date  06/05/20      PT LONG TERM GOAL #5   Title  improve endurance to ambulate for > or = to 1 hour in the community without limitation    Time  8    Period  Weeks    Status  New    Target Date  06/05/20            Plan - 04/25/20 1649    Clinical Impression Statement  The patient has mild edema which contributes to limitation in knee flexion ROM to 118 degrees.  This limitation as well as decreased quad muscle strength makes descending steps more difficult.  She is able to progress with right LE strengthening although she fatigues rather quickly with repetition.   Reduced edema and improved knee flexion ROM noted following vasocompression.  She has OA of her left knee although no modifications needed for this today with exercise.  Therapist monitoring response with all treatment interventions.    Comorbidities  Rt TKA    Rehab Potential  Excellent    PT Frequency  2x / week    PT Duration  8 weeks    PT Treatment/Interventions  ADLs/Self Care Home Management;Cryotherapy;Electrical Stimulation;Moist Heat;Functional mobility Arts administrator;Therapeutic activities;Therapeutic exercise;Balance training;Neuromuscular re-education;Patient/family education;Manual techniques;Passive range of motion;Taping;Vasopneumatic Device;Joint Manipulations    PT Next Visit Plan  progress  HEP Medbridge,  knee flexion ROM emphasis;  quad strengthening, increase resistance with single leg on right with leg press;  edema management with vascompression, proprioception    PT Home Exercise Plan  Access Code: [facility]       Patient will benefit from skilled therapeutic intervention in order to improve the following  deficits and impairments:  Abnormal gait, Decreased activity tolerance, Pain, Impaired flexibility, Decreased strength, Difficulty walking, Increased edema, Decreased endurance  Visit Diagnosis: Acute pain of right knee  Stiffness of right knee, not elsewhere classified  Other abnormalities of gait and mobility  Localized edema     Problem List Patient Active Problem List   Diagnosis Date Noted  . Status post total right knee replacement 03/08/2020  . Pain in right foot 07/05/2017  . Chronic pain of right knee 07/05/2017  . Chronic pain of left knee 07/05/2017  . Unilateral primary osteoarthritis, left knee 07/05/2017  . Unilateral primary osteoarthritis, right knee 07/05/2017  . 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, right 04/14/2017   Ruben Im, PT 04/25/20 5:05 PM Phone: [phone] Fax: [phone] Alvera Singh 04/25/2020, 5:03 PM   [facility] [address], [place], [place], [address] Phone: [phone]   Fax:  [phone]  Name: Melissa [name] MRN: [id number] Date of Birth: 1951-04-30

## 2020-04-29 ENCOUNTER — Other Ambulatory Visit: Payer: Self-pay

## 2020-04-29 ENCOUNTER — Ambulatory Visit: Payer: Medicare HMO | Admitting: Physical Therapy

## 2020-04-29 ENCOUNTER — Encounter: Payer: Self-pay | Admitting: Physical Therapy

## 2020-04-29 DIAGNOSIS — R2689 Other abnormalities of gait and mobility: Secondary | ICD-10-CM

## 2020-04-29 DIAGNOSIS — M25661 Stiffness of right knee, not elsewhere classified: Secondary | ICD-10-CM

## 2020-04-29 DIAGNOSIS — R600 Localized edema: Secondary | ICD-10-CM

## 2020-04-29 DIAGNOSIS — M25561 Pain in right knee: Secondary | ICD-10-CM

## 2020-04-29 NOTE — Therapy (Signed)
[facility] [address], [place], [place], [address] Phone: [phone]   Fax:  [phone]  Physical Therapy Treatment  Patient Details  Name: Melissa [name] MRN: [id number] Date of Birth: [birth year] Referring Provider (PT): Jean Rosenthal, MD   Encounter Date: 04/29/2020  PT End of Session - 04/29/20 1025    Visit Number  4    Date for PT Re-Evaluation  06/05/20    Authorization Type  [employer]na Medicare    PT Start Time  7026   pt late   PT Stop Time  1110    PT Time Calculation (min)  49 min    Activity Tolerance  Patient tolerated treatment well    Behavior During Therapy  [facility] for tasks assessed/performed       Past Medical History:  Diagnosis Date  . Arthritis    osteo arthritis  . Hypertension   . Hypothyroidism   . Pre-diabetes    Takes metformin  . Walking pneumonia     Past Surgical History:  Procedure Laterality Date  . MENISCUS REPAIR     right knee  . MULTIPLE TOOTH EXTRACTIONS     with implants  . ovaries removed     2003  . TONSILLECTOMY    . TOTAL KNEE ARTHROPLASTY Right 03/08/2020   Procedure: RIGHT TOTAL KNEE ARTHROPLASTY AND LEFT KNEE STEROID INJECTION;  Surgeon: Mcarthur Rossetti, MD;  Location: [facility];  Service: Orthopedics;  Laterality: Right;    There were no vitals filed for this visit.  Subjective Assessment - 04/29/20 1025    Subjective  No pain this AM. No other complaints.    Currently in Pain?  No/denies                        [facility] Adult PT Treatment/Exercise - 04/29/20 0001      Knee/Hip Exercises: Aerobic   Nustep  Level 2x 6 minutes      Knee/Hip Exercises: Machines for Strengthening   Cybex Leg Press  seat 8 75# Bil 15x 80# 15x, RTLE 35# 2x15      Knee/Hip Exercises: Standing   Forward Step Up  Right;2 sets;15 reps;Hand Hold: 0;Step Height: 6"    Step Down  Right;1 set;15 reps;Hand Hold: 2;Step Height: 6"    SLS  4x 10 sec,  added to HEP      Knee/Hip Exercises: Seated   Long Arc Quad  Strengthening;Right;2 sets;15 reps;Weights    Long Arc Quad Weight  4 lbs.    Sit to Sand  15 reps;without UE support   Holding 5# wts              PT Short Term Goals - 04/19/20 1152      PT SHORT TERM GOAL #1   Title  be independent in initial HEP    Status  On-going        PT Long Term Goals - 04/10/20 1320      PT LONG TERM GOAL #1   Title  be independent in advanced HEP    Time  8    Period  Weeks    Status  New    Target Date  06/05/20      PT LONG TERM GOAL #2   Title  reduce FOTO to < or = to 30% limitation    Time  8    Period  Weeks    Status  New    Target  Date  06/05/20      PT LONG TERM GOAL #3   Title  improve Rt LE strength to descend steps with good eccentric control    Time  8    Period  Weeks    Status  New    Target Date  06/05/20      PT LONG TERM GOAL #4   Title  report a 70% reduction in Rt knee pain to improve tolerance for standing and walking    Time  8    Period  Weeks    Status  New    Target Date  06/05/20      PT LONG TERM GOAL #5   Title  improve endurance to ambulate for > or = to 1 hour in the community without limitation    Time  8    Period  Weeks    Status  New    Target Date  06/05/20            Plan - 04/29/20 1026    Clinical Impression Statement  Pt arrives with no complaints of pain, continues with mild edema laterally. Pt was able to tolerate increased resisatnce for strength. Introduced single leg stance on level surface, added to HEP. Active flexion 119, easy at end of session.    Personal Factors and Comorbidities  Comorbidity 1    Comorbidities  Rt TKA    Examination-Activity Limitations  Stairs;Stand;Squat    Examination-Participation Restrictions  Community Activity;Shop    Stability/Clinical Decision Making  Stable/Uncomplicated    Rehab Potential  Excellent    PT Frequency  2x / week    PT Duration  8 weeks    PT  Treatment/Interventions  ADLs/Self Care Home Management;Cryotherapy;Electrical Stimulation;Moist Heat;Functional mobility Arts administrator;Therapeutic activities;Therapeutic exercise;Balance training;Neuromuscular re-education;Patient/family education;Manual techniques;Passive range of motion;Taping;Vasopneumatic Device;Joint Manipulations    PT Next Visit Plan  Continue with Rt knee strength, edema management    PT Home Exercise Plan  Access Code: [email or web address]    Consulted and Agree with Plan of Care  Patient       Patient will benefit from skilled therapeutic intervention in order to improve the following deficits and impairments:  Abnormal gait, Decreased activity tolerance, Pain, Impaired flexibility, Decreased strength, Difficulty walking, Increased edema, Decreased endurance  Visit Diagnosis: Acute pain of right knee  Stiffness of right knee, not elsewhere classified  Other abnormalities of gait and mobility  Localized edema     Problem List Patient Active Problem List   Diagnosis Date Noted  . Status post total right knee replacement 03/08/2020  . Pain in right foot 07/05/2017  . Chronic pain of right knee 07/05/2017  . Chronic pain of left knee 07/05/2017  . Unilateral primary osteoarthritis, left knee 07/05/2017  . Unilateral primary osteoarthritis, right knee 07/05/2017  . 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, right 04/14/2017    Serapio Edelson, PTA 04/29/2020, 10:57 AM  [facility] [address] [place], [place], [address] Phone: [phone]   Fax:  [phone]  Name: Melissa [name] MRN: [id number] Date of Birth: [birth year]  Access Code: [email or web address]: [email or web address]: 06/07/2021Prepared by: Anderson Malta CochranExercises  Squat with Chair Touch - 2 x daily - 7 x weekly - 3 sets - 10 reps  Seated Knee Extension with Resistance - 2 x daily  - 7 x weekly - 3 sets - 10 reps  Step Up - 2 x daily - 7 x weekly - 2 sets -  10 reps  Standing Hip Extension with Counter Support - 2 x daily - 7 x weekly - 3 sets - 10 reps  Forward Step Down - 2 x daily - 7 x weekly - 3 sets - 10 reps  Gastroc Stretch on Wall - 2 x daily - 7 x weekly - 1 sets - 3 reps - 30 hold  Single Leg Stance - 2 x daily - 7 x weekly - 3 reps - 20 hold

## 2020-05-01 ENCOUNTER — Other Ambulatory Visit: Payer: Self-pay

## 2020-05-01 ENCOUNTER — Encounter: Payer: Self-pay | Admitting: Physical Therapy

## 2020-05-01 ENCOUNTER — Ambulatory Visit: Payer: Medicare HMO | Admitting: Physical Therapy

## 2020-05-01 DIAGNOSIS — M25661 Stiffness of right knee, not elsewhere classified: Secondary | ICD-10-CM

## 2020-05-01 DIAGNOSIS — R2689 Other abnormalities of gait and mobility: Secondary | ICD-10-CM

## 2020-05-01 DIAGNOSIS — R600 Localized edema: Secondary | ICD-10-CM

## 2020-05-01 DIAGNOSIS — M25561 Pain in right knee: Secondary | ICD-10-CM | POA: Diagnosis not present

## 2020-05-01 NOTE — Therapy (Signed)
[facility] [address], [place], [place], [address] Phone: [phone]   Fax:  [phone]  Physical Therapy Treatment  Patient Details  Name: Melissa [name] MRN: [id number] Date of Birth: 10-09-1951 Referring Provider (PT): Jean Rosenthal, MD   Encounter Date: 05/01/2020  PT End of Session - 05/01/20 1147    Visit Number  5    Date for PT Re-Evaluation  06/05/20    Authorization Type  [employer]na Medicare    PT Start Time  1143    PT Stop Time  1230    PT Time Calculation (min)  47 min    Activity Tolerance  Patient tolerated treatment well    Behavior During Therapy  [facility] for tasks assessed/performed       Past Medical History:  Diagnosis Date  . Arthritis    osteo arthritis  . Hypertension   . Hypothyroidism   . Pre-diabetes    Takes metformin  . Walking pneumonia     Past Surgical History:  Procedure Laterality Date  . MENISCUS REPAIR     right knee  . MULTIPLE TOOTH EXTRACTIONS     with implants  . ovaries removed     2003  . TONSILLECTOMY    . TOTAL KNEE ARTHROPLASTY Right 03/08/2020   Procedure: RIGHT TOTAL KNEE ARTHROPLASTY AND LEFT KNEE STEROID INJECTION;  Surgeon: Mcarthur Rossetti, MD;  Location: [facility];  Service: Orthopedics;  Laterality: Right;    There were no vitals filed for this visit.  Subjective Assessment - 05/01/20 1146    Subjective  Knee just feels tight, not really pain.    Currently in Pain?  No/denies    Pain Location  Knee    Pain Orientation  Right    Pain Descriptors / Indicators  Tightness    Multiple Pain Sites  No                        [facility] Adult PT Treatment/Exercise - 05/01/20 0001      Knee/Hip Exercises: Stretches   Gastroc Stretch  Right;3 reps;20 seconds    Gastroc Stretch Limitations  On lant board       Knee/Hip Exercises: Aerobic   Nustep  L3 x 8 min PTA present to discuss status      Knee/Hip Exercises: Machines for  Strengthening   Cybex Leg Press  Seat 7 Bil 80# 2x10, RTLE 35# x15 40# 10x      Knee/Hip Exercises: Standing   Stairs  Reciprocally without rails 2x    SLS  4x 20 sec, level surface No UE      Knee/Hip Exercises: Seated   Long Arc Quad  Strengthening;Right;2 sets;10 reps;Weights   with ball squeeze   Long Arc Quad Weight  5 lbs.    Sit to Sand  2 sets;15 reps;without UE support   Holding 5# wts     Vasopneumatic   Number Minutes Vasopneumatic   15 minutes    Vasopnuematic Location   Knee   rT   Vasopneumatic Pressure  Medium    Vasopneumatic Temperature   3 snowflakes               PT Short Term Goals - 04/19/20 1152      PT SHORT TERM GOAL #1   Title  be independent in initial HEP    Status  On-going        PT Long Term Goals - 05/01/20 1205  PT LONG TERM GOAL #4   Title  report a 70% reduction in Rt knee pain to improve tolerance for standing and walking    Time  8    Period  Weeks    Status  Achieved   75%     PT LONG TERM GOAL #5   Title  improve endurance to ambulate for > or = to 1 hour in the community without limitation    Time  8    Period  Weeks    Status  Achieved            Plan - 05/01/20 1147    Clinical Impression Statement  Pt really had no difficulty with any of her strengthening exercises today. Most challenging to pt was stretching the gastroc on the slant board. Single leg stance improved on level surface to 20 sec without UE support. Pt met LTG #4 & #5 today.    Personal Factors and Comorbidities  Comorbidity 1    Comorbidities  Rt TKA    Examination-Activity Limitations  Stairs;Stand;Squat    Examination-Participation Restrictions  Community Activity;Shop    Stability/Clinical Decision Making  Stable/Uncomplicated    Rehab Potential  Excellent    PT Frequency  2x / week    PT Duration  8 weeks    PT Treatment/Interventions  ADLs/Self Care Home Management;Cryotherapy;Electrical Stimulation;Moist Heat;Functional mobility  Arts administrator;Therapeutic activities;Therapeutic exercise;Balance training;Neuromuscular re-education;Patient/family education;Manual techniques;Passive range of motion;Taping;Vasopneumatic Device;Joint Manipulations    PT Next Visit Plan  Continue with Rt knee strength, edema management, consider single leg stance on blue pod?    PT Home Exercise Plan  Access Code: [email or web address]    Consulted and Agree with Plan of Care  Patient       Patient will benefit from skilled therapeutic intervention in order to improve the following deficits and impairments:  Abnormal gait, Decreased activity tolerance, Pain, Impaired flexibility, Decreased strength, Difficulty walking, Increased edema, Decreased endurance  Visit Diagnosis: Acute pain of right knee  Stiffness of right knee, not elsewhere classified  Other abnormalities of gait and mobility  Localized edema     Problem List Patient Active Problem List   Diagnosis Date Noted  . Status post total right knee replacement 03/08/2020  . Pain in right foot 07/05/2017  . Chronic pain of right knee 07/05/2017  . Chronic pain of left knee 07/05/2017  . Unilateral primary osteoarthritis, left knee 07/05/2017  . Unilateral primary osteoarthritis, right knee 07/05/2017  . 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, right 04/14/2017    Yael Coppess, PTA 05/01/2020, 12:18 PM  [facility] [address], [place], [place], [address] Phone: [phone]   Fax:  [phone]  Name: Melissa [name] MRN: [id number] Date of Birth: 07-03-1951

## 2020-05-02 DIAGNOSIS — R7303 Prediabetes: Secondary | ICD-10-CM | POA: Diagnosis not present

## 2020-05-02 DIAGNOSIS — M170 Bilateral primary osteoarthritis of knee: Secondary | ICD-10-CM | POA: Diagnosis not present

## 2020-05-02 DIAGNOSIS — E538 Deficiency of other specified B group vitamins: Secondary | ICD-10-CM | POA: Diagnosis not present

## 2020-05-02 DIAGNOSIS — E785 Hyperlipidemia, unspecified: Secondary | ICD-10-CM | POA: Diagnosis not present

## 2020-05-02 DIAGNOSIS — E559 Vitamin D deficiency, unspecified: Secondary | ICD-10-CM | POA: Diagnosis not present

## 2020-05-02 DIAGNOSIS — I10 Essential (primary) hypertension: Secondary | ICD-10-CM | POA: Diagnosis not present

## 2020-05-02 DIAGNOSIS — E039 Hypothyroidism, unspecified: Secondary | ICD-10-CM | POA: Diagnosis not present

## 2020-05-02 DIAGNOSIS — Z0000 Encounter for general adult medical examination without abnormal findings: Secondary | ICD-10-CM | POA: Diagnosis not present

## 2020-05-03 ENCOUNTER — Encounter: Payer: Self-pay | Admitting: Physical Therapy

## 2020-05-03 ENCOUNTER — Ambulatory Visit: Payer: Medicare HMO | Admitting: Physical Therapy

## 2020-05-03 ENCOUNTER — Other Ambulatory Visit: Payer: Self-pay

## 2020-05-03 DIAGNOSIS — R2689 Other abnormalities of gait and mobility: Secondary | ICD-10-CM

## 2020-05-03 DIAGNOSIS — M25661 Stiffness of right knee, not elsewhere classified: Secondary | ICD-10-CM | POA: Diagnosis not present

## 2020-05-03 DIAGNOSIS — R600 Localized edema: Secondary | ICD-10-CM | POA: Diagnosis not present

## 2020-05-03 DIAGNOSIS — M25561 Pain in right knee: Secondary | ICD-10-CM

## 2020-05-03 NOTE — Therapy (Signed)
[facility] [address], [place], [place], [address] Phone: [phone]   Fax:  [phone]  Physical Therapy Treatment  Patient Details  Name: Melissa [name] MRN: [id number] Date of Birth: August 09, 1951 Referring Provider (PT): Jean Rosenthal, MD   Encounter Date: 05/03/2020   PT End of Session - 05/03/20 1103    Visit Number 6    Date for PT Re-Evaluation 06/05/20    Authorization Type A[employer]na Medicare    PT Start Time 1058    PT Stop Time 1150    PT Time Calculation (min) 52 min    Activity Tolerance Patient tolerated treatment well           Past Medical History:  Diagnosis Date  . Arthritis    osteo arthritis  . Hypertension   . Hypothyroidism   . Pre-diabetes    Takes metformin  . Walking pneumonia     Past Surgical History:  Procedure Laterality Date  . MENISCUS REPAIR     right knee  . MULTIPLE TOOTH EXTRACTIONS     with implants  . ovaries removed     2003  . TONSILLECTOMY    . TOTAL KNEE ARTHROPLASTY Right 03/08/2020   Procedure: RIGHT TOTAL KNEE ARTHROPLASTY AND LEFT KNEE STEROID INJECTION;  Surgeon: Mcarthur Rossetti, MD;  Location: [facility];  Service: Orthopedics;  Laterality: Right;    There were no vitals filed for this visit.   Subjective Assessment - 05/03/20 1100    Subjective Low back pain after last session lasting for 2 days.  Can't attribute it to anything specifically.   Knee feels good and stiff when I first get up.  My other knee bothers me more.  I'm having that replaced 7/30.    Currently in Pain? No/denies    Pain Score 0-No pain    Pain Location Knee    Pain Orientation Right    Pain Type Surgical pain              [facility] PT Assessment - 05/03/20 0001      AROM   Right Knee Extension 0    Right Knee Flexion 124      Strength   Right Knee Flexion 4+/5    Right Knee Extension 4+/5                         [facility] Adult PT  Treatment/Exercise - 05/03/20 0001      Knee/Hip Exercises: Stretches   Active Hamstring Stretch Right;60 seconds    Active Hamstring Stretch Limitations on 2nd step      Knee/Hip Exercises: Aerobic   Stationary Bike 2 min full revolutions    Nustep L3 x 8 min PT present to discuss status      Knee/Hip Exercises: Machines for Strengthening   Cybex Leg Press Seat 7 Bil 80# 90x, RTLE 40# 35x       Knee/Hip Exercises: Standing   Hip Abduction AROM;Left;10 reps    Abduction Limitations 30 sec     Forward Step Up Right;1 set;Step Height: 6"    Forward Step Up Limitations 30 sec     Step Down Right;10 reps;Hand Hold: 2;Step Height: 4"    Step Down Limitations 30 sec     Lunge Walking - Round Trips mini lunge 30 sec     SLS SLS on right reach down 15x     Other Standing Knee Exercises high step walking 2 laps  Knee/Hip Exercises: Seated   Long Arc Quad Strengthening;Right;15 reps    Long Arc Quad Weight 5 lbs.    Other Seated Knee/Hip Exercises small arc lifts heel off stool 15x       Vasopneumatic   Number Minutes Vasopneumatic  10 minutes    Vasopnuematic Location  Knee   rT   Vasopneumatic Pressure High    Vasopneumatic Temperature  3 snowflakes                    PT Short Term Goals - 05/03/20 1147      PT SHORT TERM GOAL #1   Title be independent in initial HEP    Status Achieved      PT SHORT TERM GOAL #2   Title report < or = to 3/10 Rt knee pain with standing and walking    Status Achieved      PT SHORT TERM GOAL #3   Title improve hip and knee endurance to walk for 45 minutes in the community without limitation    Time 4    Period Weeks    Status On-going             PT Long Term Goals - 05/01/20 1205      PT LONG TERM GOAL #4   Title report a 70% reduction in Rt knee pain to improve tolerance for standing and walking    Time 8    Period Weeks    Status Achieved   75%     PT LONG TERM GOAL #5   Title improve endurance to ambulate for  > or = to 1 hour in the community without limitation    Time 8    Period Weeks    Status Achieved                 Plan - 05/03/20 1142    Clinical Impression Statement The patient has steady improvements in knee ROM 0-124 degrees today and improving quad motor control.  She tends to do more repetitions than requested.  Therapist closely monitoring response including frequently questioning regarding LBP.  None reported during or post session.  Decreased knee edema after vasocompression and overall.    Rehab Potential Excellent    PT Frequency 2x / week    PT Duration 8 weeks    PT Treatment/Interventions ADLs/Self Care Home Management;Cryotherapy;Electrical Stimulation;Moist Heat;Functional mobility Arts administrator;Therapeutic activities;Therapeutic exercise;Balance training;Neuromuscular re-education;Patient/family education;Manual techniques;Passive range of motion;Taping;Vasopneumatic Device;Joint Manipulations    PT Next Visit Plan increase time on bike;  Continue with Rt knee strength, edema management, consider single leg stance on blue pod?;  check walking time next visit for STG    PT Home Exercise Plan Access Code: [facility]           Patient will benefit from skilled therapeutic intervention in order to improve the following deficits and impairments:  Abnormal gait, Decreased activity tolerance, Pain, Impaired flexibility, Decreased strength, Difficulty walking, Increased edema, Decreased endurance  Visit Diagnosis: Acute pain of right knee  Stiffness of right knee, not elsewhere classified  Other abnormalities of gait and mobility  Localized edema     Problem List Patient Active Problem List   Diagnosis Date Noted  . Status post total right knee replacement 03/08/2020  . Pain in right foot 07/05/2017  . Chronic pain of right knee 07/05/2017  . Chronic pain of left knee 07/05/2017  . Unilateral primary osteoarthritis, left knee 07/05/2017    . Unilateral primary  osteoarthritis, right knee 07/05/2017  . 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, right 04/14/2017   Ruben Im C 05/03/2020, 11:48 AM Ruben Im, PT 05/03/20 11:49 AM Phone: [phone] Fax: [facility] [address], [place], [place], [address] Phone: [phone]   Fax:  [phone]  Name: Melissa [name] MRN: [id number] Date of Birth: November 11, 1951

## 2020-05-06 ENCOUNTER — Encounter: Payer: Medicare HMO | Admitting: Physical Therapy

## 2020-05-08 ENCOUNTER — Ambulatory Visit: Payer: Medicare HMO | Admitting: Physical Therapy

## 2020-05-08 ENCOUNTER — Encounter: Payer: Self-pay | Admitting: Physical Therapy

## 2020-05-08 ENCOUNTER — Other Ambulatory Visit: Payer: Self-pay

## 2020-05-08 DIAGNOSIS — M25561 Pain in right knee: Secondary | ICD-10-CM | POA: Diagnosis not present

## 2020-05-08 DIAGNOSIS — M25661 Stiffness of right knee, not elsewhere classified: Secondary | ICD-10-CM | POA: Diagnosis not present

## 2020-05-08 DIAGNOSIS — R2689 Other abnormalities of gait and mobility: Secondary | ICD-10-CM | POA: Diagnosis not present

## 2020-05-08 DIAGNOSIS — R600 Localized edema: Secondary | ICD-10-CM | POA: Diagnosis not present

## 2020-05-08 NOTE — Therapy (Signed)
[facility] [address] [place], [place], [address] Phone: [phone]   Fax:  [phone]  Physical Therapy Treatment  Patient Details  Name: Melissa [name] MRN: [id number] Date of Birth: June 15, 1951 Referring Provider (PT): Doneen Poisson, MD   Encounter Date: 05/08/2020   PT End of Session - 05/08/20 1234    Visit Number 7    Date for PT Re-Evaluation 06/05/20    Authorization Type [employer] Medicare    PT Start Time 1232    PT Stop Time 1312    PT Time Calculation (min) 40 min    Activity Tolerance Patient tolerated treatment well    Behavior During Therapy [facility] for tasks assessed/performed           Past Medical History:  Diagnosis Date   Arthritis    osteo arthritis   Hypertension    Hypothyroidism    Pre-diabetes    Takes metformin   Walking pneumonia     Past Surgical History:  Procedure Laterality Date   MENISCUS REPAIR     right knee   MULTIPLE TOOTH EXTRACTIONS     with implants   ovaries removed     2003   TONSILLECTOMY     TOTAL KNEE ARTHROPLASTY Right 03/08/2020   Procedure: RIGHT TOTAL KNEE ARTHROPLASTY AND LEFT KNEE STEROID INJECTION;  Surgeon: Kathryne Hitch, MD;  Location: [facility];  Service: Orthopedics;  Laterality: Right;    There were no vitals filed for this visit.   Subjective Assessment - 05/08/20 1236    Currently in Pain? --   Some pain in her LT knee intermittently                            [facility] Adult PT Treatment/Exercise - 05/08/20 0001      Knee/Hip Exercises: Stretches   Active Hamstring Stretch Both;1 rep;60 seconds    Gastroc Stretch Both;1 rep;60 seconds      Knee/Hip Exercises: Aerobic   Stationary Bike L2 5 min VC to mainain > 5 min      Knee/Hip Exercises: Machines for Strengthening   Cybex Leg Press Seat 7 Bil 90# 2x10, RTLE 40# 2x15      Knee/Hip Exercises: Standing   Step Down Right;2 sets;10 reps;Hand  Hold: 2;Step Height: 6"    SLS blue pod 3x 20 sec no UE    Other Standing Knee Exercises high step walking with green band  35 feet 2x      Knee/Hip Exercises: Seated   Sit to Sand 1 set;15 reps   6#     Vasopneumatic   Number Minutes Vasopneumatic  10 minutes    Vasopnuematic Location  Knee   rT   Vasopneumatic Pressure High    Vasopneumatic Temperature  3 snowflakes                    PT Short Term Goals - 05/08/20 1303      PT SHORT TERM GOAL #3   Title improve hip and knee endurance to walk for 45 minutes in the community without limitation    Time 4    Period Weeks    Status Achieved   No limitations   Target Date 05/08/20             PT Long Term Goals - 05/01/20 1205      PT LONG TERM GOAL #4   Title report a 70%  reduction in Rt knee pain to improve tolerance for standing and walking    Time 8    Period Weeks    Status Achieved   75%     PT LONG TERM GOAL #5   Title improve endurance to ambulate for > or = to 1 hour in the community without limitation    Time 8    Period Weeks    Status Achieved                 Plan - 05/08/20 1234    Clinical Impression Statement Pt arrives with no complaints of Rt knee pain and visually less edema. LT knee is intermittently sore. Pt does remarkedly well with all her exercises, if anything cuing her to slow down is typically what she needs duirng todays session. Pt does appear fatigued with moments of SOB with workout today but this is most likely due to performing her exercises quickly. All short term goals are now met.    Personal Factors and Comorbidities Comorbidity 1    Comorbidities Rt TKA    Examination-Activity Limitations Stairs;Stand;Squat    Examination-Participation Restrictions Community Activity;Shop    Stability/Clinical Decision Making Stable/Uncomplicated    Rehab Potential Excellent    PT Frequency 2x / week    PT Duration 8 weeks    PT Treatment/Interventions ADLs/Self Care Home  Management;Cryotherapy;Electrical Stimulation;Moist Heat;Functional mobility Arts administrator;Therapeutic activities;Therapeutic exercise;Balance training;Neuromuscular re-education;Patient/family education;Manual techniques;Passive range of motion;Taping;Vasopneumatic Device;Joint Manipulations    PT Next Visit Plan Knee strength in standing, prepare for next knee surgery.    PT Home Exercise Plan Access Code: [email or web address]    Consulted and Agree with Plan of Care Patient           Patient will benefit from skilled therapeutic intervention in order to improve the following deficits and impairments:  Abnormal gait, Decreased activity tolerance, Pain, Impaired flexibility, Decreased strength, Difficulty walking, Increased edema, Decreased endurance  Visit Diagnosis: Acute pain of right knee  Stiffness of right knee, not elsewhere classified  Other abnormalities of gait and mobility  Localized edema     Problem List Patient Active Problem List   Diagnosis Date Noted   Status post total right knee replacement 03/08/2020   Pain in right foot 07/05/2017   Chronic pain of right knee 07/05/2017   Chronic pain of left knee 07/05/2017   Unilateral primary osteoarthritis, left knee 07/05/2017   Unilateral primary osteoarthritis, right knee 07/05/2017   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, right 04/14/2017    Ermina Oberman, PTA 05/08/2020, 1:08 PM  [facility] [address] [place], [place], [address] Phone: [phone]   Fax:  [phone]  Name: NAVEENA EYMAN MRN: [id number] Date of Birth: 1951-06-27

## 2020-05-10 ENCOUNTER — Encounter: Payer: Self-pay | Admitting: Physical Therapy

## 2020-05-10 ENCOUNTER — Ambulatory Visit: Payer: Medicare HMO | Admitting: Physical Therapy

## 2020-05-10 ENCOUNTER — Other Ambulatory Visit: Payer: Self-pay

## 2020-05-10 DIAGNOSIS — R2689 Other abnormalities of gait and mobility: Secondary | ICD-10-CM | POA: Diagnosis not present

## 2020-05-10 DIAGNOSIS — R600 Localized edema: Secondary | ICD-10-CM

## 2020-05-10 DIAGNOSIS — M25561 Pain in right knee: Secondary | ICD-10-CM

## 2020-05-10 DIAGNOSIS — M25661 Stiffness of right knee, not elsewhere classified: Secondary | ICD-10-CM

## 2020-05-10 NOTE — Therapy (Signed)
[facility] [address] [place], [place], [address] Phone: [phone]   Fax:  [phone]  Physical Therapy Treatment  Patient Details  Name: Melissa [name] MRN: [id number] Date of Birth: 15-Jul-1951 Referring Provider (PT): Jean Rosenthal, MD   Encounter Date: 05/10/2020   PT End of Session - 05/10/20 1017    Visit Number 8    Date for PT Re-Evaluation 06/05/20    Authorization Type Aetna Medicare    PT Start Time 1017    PT Stop Time 1101    PT Time Calculation (min) 44 min    Activity Tolerance Patient tolerated treatment well    Behavior During Therapy [facility] for tasks assessed/performed           Past Medical History:  Diagnosis Date  . Arthritis    osteo arthritis  . Hypertension   . Hypothyroidism   . Pre-diabetes    Takes metformin  . Walking pneumonia     Past Surgical History:  Procedure Laterality Date  . MENISCUS REPAIR     right knee  . MULTIPLE TOOTH EXTRACTIONS     with implants  . ovaries removed     2003  . TONSILLECTOMY    . TOTAL KNEE ARTHROPLASTY Right 03/08/2020   Procedure: RIGHT TOTAL KNEE ARTHROPLASTY AND LEFT KNEE STEROID INJECTION;  Surgeon: Mcarthur Rossetti, MD;  [facility]: [facility];  Service: Orthopedics;  Laterality: Right;    There were no vitals filed for this visit.   Subjective Assessment - 05/10/20 1019    Subjective Lt knee more problematic now than RT.    Currently in Pain? No/denies                             [facility] Adult PT Treatment/Exercise - 05/10/20 0001      Knee/Hip Exercises: Aerobic   Stationary [place]ike [place] program L2 x 12 min    Elliptical R1 L1 x 2 min to end session      Knee/Hip Exercises: Machines for Strengthening   Cybex Leg Press Seat 7 Bil 90# 2x15, RTLE 40# 2x15      Knee/Hip Exercises: Standing   Step Down Right;2 sets;15 reps;Hand Hold: 2;Step Height: 6"    SLS blue pod 3x 30 sec no UE    Other  Standing Knee Exercises high step walking with green band  35 feet 3x      Knee/Hip Exercises: Seated   Sit to Sand 2 sets;15 reps   6#     Vasopneumatic   Number Minutes Vasopneumatic  10 minutes    Vasopnuematic Location  Knee   rT   Vasopneumatic Pressure High    Vasopneumatic Temperature  3 snowflakes                    PT Short Term Goals - 05/08/20 1303      PT SHORT TERM GOAL #3   Title improve hip and knee endurance to walk for 45 minutes in the community without limitation    Time 4    Period Weeks    Status Achieved   No limitations   Target Date 05/08/20             PT Long Term Goals - 05/01/20 1205      PT LONG TERM GOAL #4   Title report a 70% reduction in Rt knee pain to improve tolerance for standing and walking  Time 8    Period Weeks    Status Achieved   75%     PT LONG TERM GOAL #5   Title improve endurance to ambulate for > or = to 1 hour in the community without limitation    Time 8    Period Weeks    Status Achieved                 Plan - 05/10/20 1025    Clinical Impression Statement Pt rpeorts LT knee feels more of a problem than her RT knee. Pt has no difficulties with strengthening exercises, the hill program was more of a cardiovascular challenge than quad challenge.    Personal Factors and Comorbidities Comorbidity 1    Comorbidities Rt TKA    Examination-Activity Limitations Stairs;Stand;Squat    Examination-Participation Restrictions Community Activity;Shop    Stability/Clinical Decision Making Stable/Uncomplicated    Rehab Potential Excellent    PT Frequency 2x / week    PT Duration 8 weeks    PT Treatment/Interventions ADLs/Self Care Home Management;Cryotherapy;Electrical Stimulation;Moist Heat;Functional mobility Arts administrator;Therapeutic activities;Therapeutic exercise;Balance training;Neuromuscular re-education;Patient/family education;Manual techniques;Passive range of  motion;Taping;Vasopneumatic Device;Joint Manipulations    PT Next Visit Plan Knee strength in standing, prepare for next knee surgery.    PT Home Exercise Plan Access Code: [email or web address]    Consulted and Agree with Plan of Care Patient           Patient will benefit from skilled therapeutic intervention in order to improve the following deficits and impairments:  Abnormal gait, Decreased activity tolerance, Pain, Impaired flexibility, Decreased strength, Difficulty walking, Increased edema, Decreased endurance  Visit Diagnosis: Acute pain of right knee  Stiffness of right knee, not elsewhere classified  Other abnormalities of gait and mobility  Localized edema     Problem List Patient Active Problem List   Diagnosis Date Noted  . Status post total right knee replacement 03/08/2020  . Pain in right foot 07/05/2017  . Chronic pain of right knee 07/05/2017  . Chronic pain of left knee 07/05/2017  . Unilateral primary osteoarthritis, left knee 07/05/2017  . Unilateral primary osteoarthritis, right knee 07/05/2017  . 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, right 04/14/2017    Jiraiya Mcewan, PTA 05/10/2020, 10:52 AM  [facility] [address] [place], [place], [address] Phone: [phone]   Fax:  [phone]  Name: Melissa [name] MRN: [id number] Date of Birth: 12/19/1950

## 2020-05-13 ENCOUNTER — Encounter: Payer: Self-pay | Admitting: Physical Therapy

## 2020-05-13 ENCOUNTER — Other Ambulatory Visit: Payer: Self-pay

## 2020-05-13 ENCOUNTER — Ambulatory Visit: Payer: Medicare HMO | Admitting: Physical Therapy

## 2020-05-13 DIAGNOSIS — M25661 Stiffness of right knee, not elsewhere classified: Secondary | ICD-10-CM

## 2020-05-13 DIAGNOSIS — M25561 Pain in right knee: Secondary | ICD-10-CM

## 2020-05-13 DIAGNOSIS — R600 Localized edema: Secondary | ICD-10-CM

## 2020-05-13 DIAGNOSIS — R2689 Other abnormalities of gait and mobility: Secondary | ICD-10-CM

## 2020-05-13 NOTE — Therapy (Signed)
[facility] [address], [place], [place], [address] Phone: [phone]   Fax:  [phone]  Physical Therapy Treatment  Patient Details  Name: Melissa [name] MRN: [id number] Date of Birth: 03/04/1951 Referring Provider (PT): Jean Rosenthal, MD   Encounter Date: 05/13/2020   PT End of Session - 05/13/20 1019    Visit Number 9    Date for PT Re-Evaluation 06/05/20    Authorization Type Aetna Medicare    PT Start Time 7939    PT Stop Time 1115    PT Time Calculation (min) 60 min    Activity Tolerance Patient tolerated treatment well    Behavior During Therapy [facility] for tasks assessed/performed           Past Medical History:  Diagnosis Date  . Arthritis    osteo arthritis  . Hypertension   . Hypothyroidism   . Pre-diabetes    Takes metformin  . Walking pneumonia     Past Surgical History:  Procedure Laterality Date  . MENISCUS REPAIR     right knee  . MULTIPLE TOOTH EXTRACTIONS     with implants  . ovaries removed     2003  . TONSILLECTOMY    . TOTAL KNEE ARTHROPLASTY Right 03/08/2020   Procedure: RIGHT TOTAL KNEE ARTHROPLASTY AND LEFT KNEE STEROID INJECTION;  Surgeon: Mcarthur Rossetti, MD;  Location: [facility];  Service: Orthopedics;  Laterality: Right;    There were no vitals filed for this visit.   Subjective Assessment - 05/13/20 1020    Subjective RT knee good, Lt sore and feels not as strong    Patient Stated Goals Reduce pain, improve knee strength and gait    Currently in Pain? No/denies    Multiple Pain Sites No                             [facility] Adult PT Treatment/Exercise - 05/13/20 0001      Knee/Hip Exercises: Aerobic   Stationary [place]ike [place] program L2 x 12 min   pt able to pace herself better today.     Knee/Hip Exercises: Machines for Strengthening   Cybex Leg Press Seat 7 Bil 90# 2x15, RTLE 40# 2x15      Knee/Hip Exercises: Standing   SLS blue  pod 3x 30 sec no UE    Walking with Sports Cord 20# 10x each direction    Other Standing Knee Exercises high step walking with green band  35 feet 4x      Vasopneumatic   Number Minutes Vasopneumatic  10 minutes    Vasopnuematic Location  Knee   rT   Vasopneumatic Pressure High    Vasopneumatic Temperature  3 snowflakes                    PT Short Term Goals - 05/08/20 1303      PT SHORT TERM GOAL #3   Title improve hip and knee endurance to walk for 45 minutes in the community without limitation    Time 4    Period Weeks    Status Achieved   No limitations   Target Date 05/08/20             PT Long Term Goals - 05/01/20 1205      PT LONG TERM GOAL #4   Title report a 70% reduction in Rt knee pain to improve tolerance for standing and walking  Time 8    Period Weeks    Status Achieved   75%     PT LONG TERM GOAL #5   Title improve endurance to ambulate for > or = to 1 hour in the community without limitation    Time 8    Period Weeks    Status Achieved                 Plan - 05/13/20 1019    Clinical Impression Statement Pt not having major concerns regarding her RT knee. She verbally reports more concern with the LT knee feeling weaker. Pt was able to pace herself aerobically better today. Pt required only 1 VC to slow her movements down today vs multiple times. Pt did demonstrate som elaterally hip weakness with side stepping on the cable. This improved with practice.    Personal Factors and Comorbidities Comorbidity 1    Comorbidities Rt TKA    Examination-Activity Limitations Stairs;Stand;Squat    Examination-Participation Restrictions Community Activity;Shop    Stability/Clinical Decision Making Stable/Uncomplicated    Rehab Potential Excellent    PT Frequency 2x / week    PT Duration 8 weeks    PT Treatment/Interventions ADLs/Self Care Home Management;Cryotherapy;Electrical Stimulation;Moist Heat;Functional mobility Arboriculturist;Therapeutic activities;Therapeutic exercise;Balance training;Neuromuscular re-education;Patient/family education;Manual techniques;Passive range of motion;Taping;Vasopneumatic Device;Joint Manipulations    PT Next Visit Plan Knee strength in standing, discuss DC plan with pt in next 1-2 visits as pt's main complaints are of her other knee which is going to have TKR end of July.    PT Home Exercise Plan Access Code: [email or web address]    Consulted and Agree with Plan of Care Patient           Patient will benefit from skilled therapeutic intervention in order to improve the following deficits and impairments:  Abnormal gait, Decreased activity tolerance, Pain, Impaired flexibility, Decreased strength, Difficulty walking, Increased edema, Decreased endurance  Visit Diagnosis: Acute pain of right knee  Stiffness of right knee, not elsewhere classified  Other abnormalities of gait and mobility  Localized edema     Problem List Patient Active Problem List   Diagnosis Date Noted  . Status post total right knee replacement 03/08/2020  . Pain in right foot 07/05/2017  . Chronic pain of right knee 07/05/2017  . Chronic pain of left knee 07/05/2017  . Unilateral primary osteoarthritis, left knee 07/05/2017  . Unilateral primary osteoarthritis, right knee 07/05/2017  . 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, right 04/14/2017    Melissa [name], PTA 05/13/2020, 11:01 AM   [facility] [address] [place], [place], [address] Phone: [phone]   Fax:  [phone]  Name: Melissa [name] MRN: [id number] Date of Birth: Feb 23, 1951

## 2020-05-15 ENCOUNTER — Ambulatory Visit: Payer: Medicare HMO

## 2020-05-15 ENCOUNTER — Other Ambulatory Visit: Payer: Self-pay

## 2020-05-15 DIAGNOSIS — R2689 Other abnormalities of gait and mobility: Secondary | ICD-10-CM | POA: Diagnosis not present

## 2020-05-15 DIAGNOSIS — M25661 Stiffness of right knee, not elsewhere classified: Secondary | ICD-10-CM

## 2020-05-15 DIAGNOSIS — M25561 Pain in right knee: Secondary | ICD-10-CM

## 2020-05-15 DIAGNOSIS — R600 Localized edema: Secondary | ICD-10-CM

## 2020-05-15 NOTE — Therapy (Addendum)
[facility] [address], [place], [place], [address] Phone: [phone]   Fax:  [phone]  Physical Therapy Treatment  Patient Details  Name: Melissa [name] MRN: [id number] Date of Birth: Jun 06, 1951 Referring Provider (PT): Jean Rosenthal, MD   Encounter Date: 05/15/2020 Progress Note Reporting Period  5/19/2021to 05/15/2020  See note below for Objective Data and Assessment of Progress/Goals.       PT End of Session - 05/15/20 1058    Visit Number 10    Date for PT Re-Evaluation 06/05/20    Authorization Type [employer]    PT Start Time 1015    PT Stop Time 1110    PT Time Calculation (min) 55 min    Activity Tolerance Patient tolerated treatment well    Behavior During Therapy WFL for tasks assessed/performed           Past Medical History:  Diagnosis Date  . Arthritis    osteo arthritis  . Hypertension   . Hypothyroidism   . Pre-diabetes    Takes metformin  . Walking pneumonia     Past Surgical History:  Procedure Laterality Date  . MENISCUS REPAIR     right knee  . MULTIPLE TOOTH EXTRACTIONS     with implants  . ovaries removed     2003  . TONSILLECTOMY    . TOTAL KNEE ARTHROPLASTY Right 03/08/2020   Procedure: RIGHT TOTAL KNEE ARTHROPLASTY AND LEFT KNEE STEROID INJECTION;  Surgeon: Mcarthur Rossetti, MD;  Location: [facility];  Service: Orthopedics;  Laterality: Right;    There were no vitals filed for this visit.   Subjective Assessment - 05/15/20 1022    Subjective Rt knee is doing well.  I will have my Lt knee replaced on June 21, 2020    Currently in Pain? No/denies              [facility] PT Assessment - 05/15/20 0001      Assessment   Medical Diagnosis s/p total Rt knee replacement    Referring Provider (PT) Jean Rosenthal, MD    Onset Date/Surgical Date 03/08/20    Prior Therapy home health PT      Restrictions   Weight Bearing Restrictions No        [place] residence      Prior Function   Level of Independence Independent    Vocation Full time employment      Cognition   Overall Cognitive Status Within Functional Limits for tasks assessed      Observation/Other Assessments   Focus on Therapeutic Outcomes (FOTO)  19% limitation      AROM   Right Knee Extension 0    Right Knee Flexion 124      Strength   Right Knee Flexion 4+/5    Right Knee Extension 4+/5      Ambulation/Gait   Ambulation/Gait Yes    Gait Pattern Step-through pattern;Antalgic                         [facility] Adult PT Treatment/Exercise - 05/15/20 0001      Knee/Hip Exercises: Aerobic   Stationary [place] program L5 x 15 min   longer distance today- fatigue at end.  did Level 5 today     Knee/Hip Exercises: Machines for Strengthening   Cybex Leg Press Seat 7 Bil 100# 2x15, Rt and Lt 50#  2x15  Knee/Hip Exercises: Standing   SLS blue pod 3x 30 sec no UE    Walking with Sports Cord 20# 10x each direction      Vasopneumatic   Number Minutes Vasopneumatic  15 minutes    Vasopnuematic Location  Knee   rT   Vasopneumatic Pressure High    Vasopneumatic Temperature  3 snowflakes                    PT Short Term Goals - 05/08/20 1303      PT SHORT TERM GOAL #3   Title improve hip and knee endurance to walk for 45 minutes in the community without limitation    Time 4    Period Weeks    Status Achieved   No limitations   Target Date 05/08/20             PT Long Term Goals - 05/15/20 1023      PT LONG TERM GOAL #1   Title be independent in advanced HEP    Time 8    Period Weeks    Status On-going    Target Date 06/05/20      PT LONG TERM GOAL #2   Title reduce FOTO to < or = to 30% limitation    Baseline 19%    Status Achieved      PT LONG TERM GOAL #3   Title improve Rt LE strength to descend steps with good eccentric control    Baseline Rt hip intstability     Time 8    Period Weeks    Status On-going    Target Date 06/05/20      PT LONG TERM GOAL #4   Title report a 70% reduction in Rt knee pain to improve tolerance for standing and walking    Time 8    Status Achieved      PT LONG TERM GOAL #5   Title improve endurance to ambulate for > or = to 1 hour in the community without limitation    Baseline no significant limitation    Status Achieved                 Plan - 05/15/20 1045    Clinical Impression Statement Pt is making excellent progress s/p TKA.  Pt denies any significant pain, just stiffness.  Pt not having major concerns regarding her Rt knee. Pt will have Lt knee replacement on 06/21/20 and exercise is focused on achieving max stability of the Rt LE to improve mobility and outcome s/p surgery.  FOTO is improved to 19% limitation.  Pt did demonstrate some Rt lateral hip hip weakness/instability with side stepping on the cable. PT provided verbal cues for glute med activation with single limb and asymmetrical movement.  Pt will continue to benefit from skilled PT to address Rt LE stability and endurance.    Rehab Potential Excellent    PT Frequency 3x / week    PT Duration 8 weeks    PT Treatment/Interventions ADLs/Self Care Home Management;Cryotherapy;Electrical Stimulation;Moist Heat;Functional mobility Arts administrator;Therapeutic activities;Therapeutic exercise;Balance training;Neuromuscular re-education;Patient/family education;Manual techniques;Passive range of motion;Taping;Vasopneumatic Device;Joint Manipulations    PT Next Visit Plan Pt will continue until 06/05/20 to maximize Rt and Lt LE strength prior to Lt TKA.  Focus on strength, stability and endurance.    PT Home Exercise Plan Access Code: [facility]    Recommended Other Services initial cert is signed    Consulted and Agree with Plan of Care Patient  Patient will benefit from skilled therapeutic intervention in order to improve the  following deficits and impairments:  Abnormal gait, Decreased activity tolerance, Pain, Impaired flexibility, Decreased strength, Difficulty walking, Increased edema, Decreased endurance  Visit Diagnosis: Stiffness of right knee, not elsewhere classified - Plan: PT plan of care cert/re-cert  Acute pain of right knee - Plan: PT plan of care cert/re-cert  Other abnormalities of gait and mobility - Plan: PT plan of care cert/re-cert  Localized edema - Plan: PT plan of care cert/re-cert     Problem List Patient Active Problem List   Diagnosis Date Noted  . Status post total right knee replacement 03/08/2020  . Pain in right foot 07/05/2017  . Chronic pain of right knee 07/05/2017  . Chronic pain of left knee 07/05/2017  . Unilateral primary osteoarthritis, left knee 07/05/2017  . Unilateral primary osteoarthritis, right knee 07/05/2017  . 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, right 04/14/2017   Sigurd Sos, PT 05/15/20 11:07 AM  [facility] [address] [place], [place], [address] Phone: [phone]   Fax:  [phone]  Name: ANEKA FAGERSTROM MRN: [id number] Date of Birth: [birth year]

## 2020-05-17 ENCOUNTER — Ambulatory Visit: Payer: Medicare HMO | Admitting: Physical Therapy

## 2020-05-20 ENCOUNTER — Telehealth: Payer: Self-pay | Admitting: Orthopaedic Surgery

## 2020-05-20 NOTE — Telephone Encounter (Signed)
I guess you can find out who did her home health therapy after her other replacement and see if they would see her that frequently.  I am not sure that that is possible but do check and see please

## 2020-05-20 NOTE — Telephone Encounter (Signed)
Patient called.   Requesting she be set up for home PT after her surgery. Wants a daily frequency for at least the first two weeks.   Call back: [phone]

## 2020-05-21 NOTE — Telephone Encounter (Signed)
Patient aware that if her insurance will cover than she can do so

## 2020-05-29 ENCOUNTER — Encounter: Payer: Self-pay | Admitting: Physical Therapy

## 2020-05-29 ENCOUNTER — Ambulatory Visit: Payer: Medicare HMO | Attending: Orthopaedic Surgery | Admitting: Physical Therapy

## 2020-05-29 ENCOUNTER — Other Ambulatory Visit: Payer: Self-pay

## 2020-05-29 DIAGNOSIS — R2689 Other abnormalities of gait and mobility: Secondary | ICD-10-CM | POA: Insufficient documentation

## 2020-05-29 DIAGNOSIS — M25561 Pain in right knee: Secondary | ICD-10-CM | POA: Diagnosis not present

## 2020-05-29 DIAGNOSIS — M25661 Stiffness of right knee, not elsewhere classified: Secondary | ICD-10-CM | POA: Diagnosis not present

## 2020-05-29 DIAGNOSIS — R600 Localized edema: Secondary | ICD-10-CM | POA: Insufficient documentation

## 2020-05-29 NOTE — Therapy (Signed)
[facility] [address], [place], [place], [address] Phone: [phone]   Fax:  [phone]  Physical Therapy Treatment  Patient Details  Name: Melissa [name] MRN: [id number] Date of Birth: Jul 28, 1951 Referring Provider (PT): Jean Rosenthal, MD   Encounter Date: 05/29/2020   PT End of Session - 05/29/20 0807    Visit Number 11    Date for PT Re-Evaluation 06/05/20    Authorization Type [employer] Medicare    PT Start Time 0801    PT Stop Time 0900    PT Time Calculation (min) 59 min    Activity Tolerance Patient tolerated treatment well    Behavior During Therapy [facility] for tasks assessed/performed           Past Medical History:  Diagnosis Date  . Arthritis    osteo arthritis  . Hypertension   . Hypothyroidism   . Pre-diabetes    Takes metformin  . Walking pneumonia     Past Surgical History:  Procedure Laterality Date  . MENISCUS REPAIR     right knee  . MULTIPLE TOOTH EXTRACTIONS     with implants  . ovaries removed     2003  . TONSILLECTOMY    . TOTAL KNEE ARTHROPLASTY Right 03/08/2020   Procedure: RIGHT TOTAL KNEE ARTHROPLASTY AND LEFT KNEE STEROID INJECTION;  Surgeon: Mcarthur Rossetti, MD;  Location: [facility];  Service: Orthopedics;  Laterality: Right;    There were no vitals filed for this visit.   Subjective Assessment - 05/29/20 0809    Subjective Stiff this AM. No pain    Currently in Pain? No/denies    Multiple Pain Sites No                             [facility] Adult PT Treatment/Exercise - 05/29/20 0001      Knee/Hip Exercises: Aerobic   Stationary Bike L3 x 10 min with concurrent discussion of pt's progress and future outlook      Knee/Hip Exercises: Machines for Strengthening   Cybex Leg Press Seat 7 Bil 100# 15x, 110# 15x: Unilateral 50# 20x each leg, then 55# 20x      Knee/Hip Exercises: Standing   SLS blue pod 3x 30 sec no UE    Walking with Sports  Cord 25# forward/backward: 20#  side to side 10x each    Other Standing Knee Exercises high step walking with green band  35 feet 4x   Band was tied tighter     Vasopneumatic   Number Minutes Vasopneumatic  15 minutes    Vasopnuematic Location  Knee   rT   Vasopneumatic Pressure High    Vasopneumatic Temperature  3 snowflakes                    PT Short Term Goals - 05/08/20 1303      PT SHORT TERM GOAL #3   Title improve hip and knee endurance to walk for 45 minutes in the community without limitation    Time 4    Period Weeks    Status Achieved   No limitations   Target Date 05/08/20             PT Long Term Goals - 05/15/20 1023      PT LONG TERM GOAL #1   Title be independent in advanced HEP    Time 8    Period Weeks  Status On-going    Target Date 06/05/20      PT LONG TERM GOAL #2   Title reduce FOTO to < or = to 30% limitation    Baseline 19%    Status Achieved      PT LONG TERM GOAL #3   Title improve Rt LE strength to descend steps with good eccentric control    Baseline Rt hip intstability    Time 8    Period Weeks    Status On-going    Target Date 06/05/20      PT LONG TERM GOAL #4   Title report a 70% reduction in Rt knee pain to improve tolerance for standing and walking    Time 8    Status Achieved      PT LONG TERM GOAL #5   Title improve endurance to ambulate for > or = to 1 hour in the community without limitation    Baseline no significant limitation    Status Achieved                 Plan - 05/29/20 0808    Clinical Impression Statement Main complaint today is knee stiffness. Pt has no difficulties with treatment even when increasing resistance for quad strength. Knee continues with visable edema which she still receives benefit from the Game ready at the end of the session.    Personal Factors and Comorbidities Comorbidity 1    Comorbidities Rt TKA    Examination-Activity Limitations Stairs;Stand;Squat     Examination-Participation Restrictions Community Activity;Shop    Stability/Clinical Decision Making Stable/Uncomplicated    Rehab Potential Excellent    PT Frequency 3x / week    PT Duration 8 weeks    PT Treatment/Interventions ADLs/Self Care Home Management;Cryotherapy;Electrical Stimulation;Moist Heat;Functional mobility Arts administrator;Therapeutic activities;Therapeutic exercise;Balance training;Neuromuscular re-education;Patient/family education;Manual techniques;Passive range of motion;Taping;Vasopneumatic Device;Joint Manipulations    PT Next Visit Plan Pt will continue until 06/05/20 to maximize Rt and Lt LE strength prior to Lt TKA.  Focus on strength, stability and endurance.    PT Home Exercise Plan Access Code: [email or web address]    Consulted and Agree with Plan of Care Patient           Patient will benefit from skilled therapeutic intervention in order to improve the following deficits and impairments:  Abnormal gait, Decreased activity tolerance, Pain, Impaired flexibility, Decreased strength, Difficulty walking, Increased edema, Decreased endurance  Visit Diagnosis: Stiffness of right knee, not elsewhere classified  Acute pain of right knee  Other abnormalities of gait and mobility  Localized edema     Problem List Patient Active Problem List   Diagnosis Date Noted  . Status post total right knee replacement 03/08/2020  . Pain in right foot 07/05/2017  . Chronic pain of right knee 07/05/2017  . Chronic pain of left knee 07/05/2017  . Unilateral primary osteoarthritis, left knee 07/05/2017  . Unilateral primary osteoarthritis, right knee 07/05/2017  . 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, right 04/14/2017    Amber Williard, PTA 05/29/2020, 8:40 AM  [facility] [address] [place], [place], [address] Phone: [phone]   Fax:  [phone]  Name: ROZELIA CATAPANO MRN: [id number] Date of Birth: 1951-05-31

## 2020-06-04 ENCOUNTER — Ambulatory Visit: Payer: Medicare HMO

## 2020-06-04 ENCOUNTER — Other Ambulatory Visit: Payer: Self-pay

## 2020-06-04 DIAGNOSIS — R2689 Other abnormalities of gait and mobility: Secondary | ICD-10-CM | POA: Diagnosis not present

## 2020-06-04 DIAGNOSIS — M25561 Pain in right knee: Secondary | ICD-10-CM

## 2020-06-04 DIAGNOSIS — M25661 Stiffness of right knee, not elsewhere classified: Secondary | ICD-10-CM

## 2020-06-04 DIAGNOSIS — R600 Localized edema: Secondary | ICD-10-CM

## 2020-06-04 NOTE — Therapy (Signed)
[facility] [address], [place], [place], [address] Phone: [phone]   Fax:  [phone]  Physical Therapy Treatment  Patient Details  Name: NITHYA MERIWEATHER MRN: [id number] Date of Birth: 1951/11/03 Referring Provider (PT): Jean Rosenthal, MD   Encounter Date: 06/04/2020   PT End of Session - 06/04/20 0941    Visit Number 12    PT Start Time 7867    PT Stop Time 0948    PT Time Calculation (min) 61 min    Activity Tolerance Patient tolerated treatment well    Behavior During Therapy [facility] for tasks assessed/performed           Past Medical History:  Diagnosis Date  . Arthritis    osteo arthritis  . Hypertension   . Hypothyroidism   . Pre-diabetes    Takes metformin  . Walking pneumonia     Past Surgical History:  Procedure Laterality Date  . MENISCUS REPAIR     right knee  . MULTIPLE TOOTH EXTRACTIONS     with implants  . ovaries removed     2003  . TONSILLECTOMY    . TOTAL KNEE ARTHROPLASTY Right 03/08/2020   Procedure: RIGHT TOTAL KNEE ARTHROPLASTY AND LEFT KNEE STEROID INJECTION;  Surgeon: Mcarthur Rossetti, MD;  Location: [facility];  Service: Orthopedics;  Laterality: Right;    There were no vitals filed for this visit.   Subjective Assessment - 06/04/20 0852    Subjective Ready to D/C for the Rt knee.  I have my Lt knee replaced 06/21/20.    Currently in Pain? No/denies              [facility] PT Assessment - 06/04/20 0001      Assessment   Medical Diagnosis s/p total Rt knee replacement    Referring Provider (PT) Jean Rosenthal, MD    Onset Date/Surgical Date 03/08/20      [place] residence      Prior Function   Level of Independence Independent    Vocation Full time employment      Cognition   Overall Cognitive Status Within Functional Limits for tasks assessed      Observation/Other Assessments   Focus on Therapeutic  Outcomes (FOTO)  19% limitation      AROM   Right Knee Extension 0    Right Knee Flexion 124      Strength   Right Knee Flexion 5/5    Right Knee Extension 5/5      Ambulation/Gait   Ambulation/Gait Yes    Gait Pattern Step-through pattern;Antalgic                         [facility] Adult PT Treatment/Exercise - 06/04/20 0001      Knee/Hip Exercises: Aerobic   Stationary Bike L3 x 10 min with concurrent discussion of pt's progress and future outlook      Knee/Hip Exercises: Machines for Strengthening   Cybex Leg Press Seat 7 Bil 110# x 40 Unilateral 55# 2x15 each      Knee/Hip Exercises: Standing   Walking with Sports Cord 25# forward/backward: 20#  side to side 10x each      Vasopneumatic   Number Minutes Vasopneumatic  15 minutes    Vasopnuematic Location  Knee   Rt   Vasopneumatic Pressure High  PT Short Term Goals - 05/08/20 1303      PT SHORT TERM GOAL #3   Title improve hip and knee endurance to walk for 45 minutes in the community without limitation    Time 4    Period Weeks    Status Achieved   No limitations   Target Date 05/08/20             PT Long Term Goals - 06/04/20 0932      PT LONG TERM GOAL #1   Title be independent in advanced HEP    Status Achieved      PT LONG TERM GOAL #2   Title reduce FOTO to < or = to 30% limitation    Baseline 19%    Status Achieved      PT LONG TERM GOAL #3   Title improve Rt LE strength to descend steps with good eccentric control    Status Achieved      PT LONG TERM GOAL #4   Title report a 70% reduction in Rt knee pain to improve tolerance for standing and walking    Status Achieved      PT LONG TERM GOAL #5   Title improve endurance to ambulate for > or = to 1 hour in the community without limitation    Status Achieved                 Plan - 06/04/20 0908    Clinical Impression Statement Pt is ready to D/C to HEP for Rt knee.  Pt will have Lt knee  replaced on 06/21/20.  Pt with full Rt knee A/ROM and strength.  Pt demonstrates mild Rt hip instability with descending steps on the Rt.  Pt has HEP in place to address strength and stability.  Pt will continue with HEP until surgery on 06/21/20.    PT Next Visit Plan D/C PT to HEP    PT Home Exercise Plan Access Code: [facility]    Consulted and Agree with Plan of Care Patient           Patient will benefit from skilled therapeutic intervention in order to improve the following deficits and impairments:     Visit Diagnosis: Stiffness of right knee, not elsewhere classified  Acute pain of right knee  Other abnormalities of gait and mobility  Localized edema     Problem List Patient Active Problem List   Diagnosis Date Noted  . Status post total right knee replacement 03/08/2020  . Pain in right foot 07/05/2017  . Chronic pain of right knee 07/05/2017  . Chronic pain of left knee 07/05/2017  . Unilateral primary osteoarthritis, left knee 07/05/2017  . Unilateral primary osteoarthritis, right knee 07/05/2017  . 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, right 04/14/2017   PHYSICAL THERAPY DISCHARGE SUMMARY  Visits from Start of Care: 12 Current functional level related to goals / functional outcomes: See above.  No significant limitations due to Rt knee at this time.    Remaining deficits: Some Rt hip instability with descending steps and with proprioceptive activity.  Pt will continue with HEP until Lt TKA scheduled for 06/21/20  Education / Equipment: HEP Plan: Patient agrees to discharge.  Patient goals were met. Patient is being discharged due to meeting the stated rehab goals.  ?????          Sigurd Sos, PT 06/04/20 9:43 AM  [facility] [address]  [place], [place], [address] Phone: [phone]   Fax:  [phone]  Name: DANN VENTRESS MRN: [id number] Date of Birth:  1951-04-29

## 2020-06-10 ENCOUNTER — Encounter: Payer: Medicare HMO | Admitting: Physical Therapy

## 2020-06-11 ENCOUNTER — Other Ambulatory Visit: Payer: Self-pay | Admitting: Physician Assistant

## 2020-06-12 ENCOUNTER — Telehealth: Payer: Self-pay | Admitting: Orthopaedic Surgery

## 2020-06-12 ENCOUNTER — Encounter: Payer: Medicare HMO | Admitting: Physical Therapy

## 2020-06-12 ENCOUNTER — Other Ambulatory Visit: Payer: Self-pay

## 2020-06-12 DIAGNOSIS — Z96659 Presence of unspecified artificial knee joint: Secondary | ICD-10-CM

## 2020-06-12 NOTE — Telephone Encounter (Signed)
I called pt and lm on vm to advise that therapy is arranged after the procedure. Typically there will be in home services and that will work with a patient and once they have accomplished their goals and feel the pt is safe to transition to out patient services they will contact the office and make [place] aware prior to discharge so that there is no interruption in therapy. We can at that point make the referral to the [facility] location as she has requested. To call the office with any additional questions.

## 2020-06-12 NOTE — Telephone Encounter (Signed)
She will be s/p left total knee arthroplasty

## 2020-06-12 NOTE — Telephone Encounter (Signed)
Can you please call her and let her know I put referral in.

## 2020-06-12 NOTE — Telephone Encounter (Signed)
[place] for this? What does she need PT to work on.

## 2020-06-12 NOTE — Telephone Encounter (Signed)
Patient called requesting Dr. Trevor Mace office send referral for the following. Patient needs inpatient physical therapy before scheduled surgery date 06/21/20 and also needs outpatient physical therapy at [facility] location. Please call patient about this matter at [phone].

## 2020-06-14 NOTE — Patient Instructions (Addendum)
DUE TO COVID-19 ONLY ONE VISITOR IS ALLOWED TO COME WITH YOU AND STAY IN THE WAITING ROOM ONLY DURING PRE OP AND PROCEDURE DAY OF SURGERY. THE 1 VISITOR MAY VISIT WITH YOU AFTER SURGERY IN YOUR PRIVATE ROOM DURING VISITING HOURS ONLY!  YOU NEED TO HAVE A COVID 19 TEST ON 06-18-20 @ 9:40 AM, THIS TEST MUST BE DONE BEFORE SURGERY, COME  [place], [place] , [address].  ([place]) ONCE YOUR COVID TEST IS COMPLETED, PLEASE BEGIN THE QUARANTINE INSTRUCTIONS AS OUTLINED IN YOUR HANDOUT.                Melissa [name]  06/14/2020   Your procedure is scheduled on: 06-21-20   Report to [facility] Main  Entrance    Report to Admitting at 8:30 AM     Call this number if you have problems the morning of surgery [phone]    Remember: AFTER MIDNIGHT THE NIGHT PRIOR TO SURGERY. NOTHING BY MOUTH EXCEPT CLEAR LIQUIDS UNTIL 8:00 AM . PLEASE FINISH G2 DRINK AT 8:00 AM.    CLEAR LIQUID DIET   Foods Allowed                                                                     Foods Excluded  Coffee and tea, regular and decaf                             liquids that you cannot  Plain Jell-O any favor except red or purple                                           see through such as: Fruit ices (not with fruit pulp)                                     milk, soups, orange juice  Iced Popsicles                                    All solid food Carbonated beverages, regular and diet                                    Cranberry, grape and apple juices Sports drinks like Gatorade Lightly seasoned clear broth or consume(fat free) Sugar, honey syrup   _____________________________________________________________________      BRUSH YOUR TEETH MORNING OF SURGERY AND RINSE YOUR MOUTH OUT, NO CHEWING GUM CANDY OR MINTS.    Take these medicines the morning of surgery with A SIP OF WATER: Levothyroxine (Synthroid)  DO NOT TAKE ANY DIABETIC MEDICATIONS DAY OF YOUR  SURGERY                               You may not have any metal on your body including  hair pins and              piercings     Do not wear jewelry, make-up, lotions, powders or perfumes, deodorant              Do not wear nail polish on your fingernails.  Do not shave  48 hours prior to surgery.                Do not bring valuables to the hospital. [place].  Contacts, dentures or bridgework may not be worn into surgery.  You may bring a small overnight bag     Special Instructions: N/A              Please read over the following fact sheets you were given: _____________________________________________________________________  How to Manage Your Diabetes Before and After Surgery  Why is it important to control my blood sugar before and after surgery? . Improving blood sugar levels before and after surgery helps healing and can limit problems. . A way of improving blood sugar control is eating a healthy diet by: o  Eating less sugar and carbohydrates o  Increasing activity/exercise o  Talking with your doctor about reaching your blood sugar goals . High blood sugars (greater than 180 mg/dL) can raise your risk of infections and slow your recovery, so you will need to focus on controlling your diabetes during the weeks before surgery. . Make sure that the doctor who takes care of your diabetes knows about your planned surgery including the date and location.  How do I manage my blood sugar before surgery? . Check your blood sugar at least 4 times a day, starting 2 days before surgery, to make sure that the level is not too high or low. o Check your blood sugar the morning of your surgery when you wake up and every 2 hours until you get to the Short Stay unit. . If your blood sugar is less than 70 mg/dL, you will need to treat for low blood sugar: o Do not take insulin. o Treat a low blood sugar (less than 70 mg/dL) with  cup of  clear juice (cranberry or apple), 4 glucose tablets, OR glucose gel. o Recheck blood sugar in 15 minutes after treatment (to make sure it is greater than 70 mg/dL). If your blood sugar is not greater than 70 mg/dL on recheck, call [phone] for further instructions. . Report your blood sugar to the short stay nurse when you get to Short Stay.  . If you are admitted to the hospital after surgery: o Your blood sugar will be checked by the staff and you will probably be given insulin after surgery (instead of oral diabetes medicines) to make sure you have good blood sugar levels. o The goal for blood sugar control after surgery is 80-180 mg/dL.   WHAT DO I DO ABOUT MY DIABETES MEDICATION?  Marland Kitchen Do not take oral diabetes medicines (pills) the morning of surgery.  THE DAY BEFORE SURGERY, take your usual Metformin           Reviewed and Endorsed by [facility] Patient Education Committee, August 2015           [facility] - Preparing for Surgery Before surgery, you can play an important role.  Because skin is not sterile, your skin needs to be as free of germs as  possible.  You can reduce the number of germs on your skin by washing with CHG (chlorahexidine gluconate) soap before surgery.  CHG is an antiseptic cleaner which kills germs and bonds with the skin to continue killing germs even after washing. Please DO NOT use if you have an allergy to CHG or antibacterial soaps.  If your skin becomes reddened/irritated stop using the CHG and inform your nurse when you arrive at Short Stay. Do not shave (including legs and underarms) for at least 48 hours prior to the first CHG shower.  You may shave your face/neck. Please follow these instructions carefully:  1.  Shower with CHG Soap the night before surgery and the  morning of Surgery.  2.  If you choose to wash your hair, wash your hair first as usual with your  normal  shampoo.  3.  After you shampoo, rinse your hair and body thoroughly to remove  the  shampoo.                           4.  Use CHG as you would any other liquid soap.  You can apply chg directly  to the skin and wash                       Gently with a scrungie or clean washcloth.  5.  Apply the CHG Soap to your body ONLY FROM THE NECK DOWN.   Do not use on face/ open                           Wound or open sores. Avoid contact with eyes, ears mouth and genitals (private parts).                       Wash face,  Genitals (private parts) with your normal soap.             6.  Wash thoroughly, paying special attention to the area where your surgery  will be performed.  7.  Thoroughly rinse your body with warm water from the neck down.  8.  DO NOT shower/wash with your normal soap after using and rinsing off  the CHG Soap.                9.  Pat yourself dry with a clean towel.            10.  Wear clean pajamas.            11.  Place clean sheets on your bed the night of your first shower and do not  sleep with pets. Day of Surgery : Do not apply any lotions/deodorants the morning of surgery.  Please wear clean clothes to the hospital/surgery center.  FAILURE TO FOLLOW THESE INSTRUCTIONS MAY RESULT IN THE CANCELLATION OF YOUR SURGERY PATIENT SIGNATURE_________________________________  NURSE SIGNATURE__________________________________  ________________________________________________________________________   Adam Phenix  An incentive spirometer is a tool that can help keep your lungs clear and active. This tool measures how well you are filling your lungs with each breath. Taking long deep breaths may help reverse or decrease the chance of developing breathing (pulmonary) problems (especially infection) following:  A long period of time when you are unable to move or be active. BEFORE THE PROCEDURE   If the spirometer includes an indicator to show your best effort, your nurse or respiratory therapist  will set it to a desired goal.  If possible, sit up  straight or lean slightly forward. Try not to slouch.  Hold the incentive spirometer in an upright position. INSTRUCTIONS FOR USE  1. Sit on the edge of your bed if possible, or sit up as far as you can in bed or on a chair. 2. Hold the incentive spirometer in an upright position. 3. Breathe out normally. 4. Place the mouthpiece in your mouth and seal your lips tightly around it. 5. Breathe in slowly and as deeply as possible, raising the piston or the ball toward the top of the column. 6. Hold your breath for 3-5 seconds or for as long as possible. Allow the piston or ball to fall to the bottom of the column. 7. Remove the mouthpiece from your mouth and breathe out normally. 8. Rest for a few seconds and repeat Steps 1 through 7 at least 10 times every 1-2 hours when you are awake. Take your time and take a few normal breaths between deep breaths. 9. The spirometer may include an indicator to show your best effort. Use the indicator as a goal to work toward during each repetition. 10. After each set of 10 deep breaths, practice coughing to be sure your lungs are clear. If you have an incision (the cut made at the time of surgery), support your incision when coughing by placing a pillow or rolled up towels firmly against it. Once you are able to get out of bed, walk around indoors and cough well. You may stop using the incentive spirometer when instructed by your caregiver.  RISKS AND COMPLICATIONS  Take your time so you do not get dizzy or light-headed.  If you are in pain, you may need to take or ask for pain medication before doing incentive spirometry. It is harder to take a deep breath if you are having pain. AFTER USE  Rest and breathe slowly and easily.  It can be helpful to keep track of a log of your progress. Your caregiver can provide you with a simple table to help with this. If you are using the spirometer at home, follow these instructions: [place] IF:   You are  having difficultly using the spirometer.  You have trouble using the spirometer as often as instructed.  Your pain medication is not giving enough relief while using the spirometer.  You develop fever of 100.5 F (38.1 C) or higher. SEEK IMMEDIATE MEDICAL CARE IF:   You cough up bloody sputum that had not been present before.  You develop fever of 102 F (38.9 C) or greater.  You develop worsening pain at or near the incision site. MAKE SURE YOU:   Understand these instructions.  Will watch your condition.  Will get help right away if you are not doing well or get worse. Document Released: 03/22/2007 Document Revised: 02/01/2012 Document Reviewed: 05/23/2007 ExitCare Patient Information 2014 ExitCare, [place].   ________________________________________________________________________  WHAT IS A BLOOD TRANSFUSION? Blood Transfusion Information  A transfusion is the replacement of blood or some of its parts. Blood is made up of multiple cells which provide different functions.  Red blood cells carry oxygen and are used for blood loss replacement.  White blood cells fight against infection.  Platelets control bleeding.  Plasma helps clot blood.  Other blood products are available for specialized needs, such as hemophilia or other clotting disorders. BEFORE THE TRANSFUSION  Who gives blood for transfusions?   Healthy volunteers who are  fully evaluated to make sure their blood is safe. This is blood bank blood. Transfusion therapy is the safest it has ever been in the practice of medicine. Before blood is taken from a donor, a complete history is taken to make sure that person has no history of diseases nor engages in risky social behavior (examples are intravenous drug use or sexual activity with multiple partners). The donor's travel history is screened to minimize risk of transmitting infections, such as malaria. The donated blood is tested for signs of infectious diseases,  such as HIV and hepatitis. The blood is then tested to be sure it is compatible with you in order to minimize the chance of a transfusion reaction. If you or a relative donates blood, this is often done in anticipation of surgery and is not appropriate for emergency situations. It takes many days to process the donated blood. RISKS AND COMPLICATIONS Although transfusion therapy is very safe and saves many lives, the main dangers of transfusion include:   Getting an infectious disease.  Developing a transfusion reaction. This is an allergic reaction to something in the blood you were given. Every precaution is taken to prevent this. The decision to have a blood transfusion has been considered carefully by your caregiver before blood is given. Blood is not given unless the benefits outweigh the risks. AFTER THE TRANSFUSION  Right after receiving a blood transfusion, you will usually feel much better and more energetic. This is especially true if your red blood cells have gotten low (anemic). The transfusion raises the level of the red blood cells which carry oxygen, and this usually causes an energy increase.  The nurse administering the transfusion will monitor you carefully for complications. HOME CARE INSTRUCTIONS  No special instructions are needed after a transfusion. You may find your energy is better. Speak with your caregiver about any limitations on activity for underlying diseases you may have. SEEK MEDICAL CARE IF:   Your condition is not improving after your transfusion.  You develop redness or irritation at the intravenous (IV) site. SEEK IMMEDIATE MEDICAL CARE IF:  Any of the following symptoms occur over the next 12 hours:  Shaking chills.  You have a temperature by mouth above 102 F (38.9 C), not controlled by medicine.  Chest, back, or muscle pain.  People around you feel you are not acting correctly or are confused.  Shortness of breath or difficulty  breathing.  Dizziness and fainting.  You get a rash or develop hives.  You have a decrease in urine output.  Your urine turns a dark color or changes to pink, red, or brown. Any of the following symptoms occur over the next 10 days:  You have a temperature by mouth above 102 F (38.9 C), not controlled by medicine.  Shortness of breath.  Weakness after normal activity.  The white part of the eye turns yellow (jaundice).  You have a decrease in the amount of urine or are urinating less often.  Your urine turns a dark color or changes to pink, red, or brown. Document Released: 11/06/2000 Document Revised: 02/01/2012 Document Reviewed: 06/25/2008 [facility] Patient Information 2014 [place], [place].  _______________________________________________________________________

## 2020-06-14 NOTE — Progress Notes (Signed)
COVID Vaccine Completed: Date COVID Vaccine completed: COVID vaccine manufacturer: [place]   PCP - Marda Stalker, [facility] Cardiologist - N/A No stimulator  Chest x-ray -  EKG - 03-04-20 Stress Test -  ECHO -  Cardiac Cath -   Sleep Study -  CPAP -   Fasting Blood Sugar -  Checks Blood Sugar __0___ times a day  ADL's w/o SOB  Blood Thinner Instructions: Aspirin Instructions: 81mg  ASA (Pt not told to hold)  Last Dose:  Anesthesia review:   Patient denies shortness of breath, fever, cough and chest pain at PAT appointment   Patient verbalized understanding of instructions that were given to them at the PAT appointment. Patient was also instructed that they will need to review over the PAT instructions again at home before surgery.

## 2020-06-18 ENCOUNTER — Other Ambulatory Visit (HOSPITAL_COMMUNITY)
Admission: RE | Admit: 2020-06-18 | Discharge: 2020-06-18 | Disposition: A | Discharge status: Home / Self Care | Payer: Medicare HMO | Source: Ambulatory Visit | Attending: Orthopaedic Surgery | Admitting: Orthopaedic Surgery

## 2020-06-18 ENCOUNTER — Encounter (HOSPITAL_COMMUNITY): Payer: Self-pay

## 2020-06-18 ENCOUNTER — Encounter (HOSPITAL_COMMUNITY)
Admission: RE | Admit: 2020-06-18 | Discharge: 2020-06-18 | Disposition: A | Discharge status: Home / Self Care | Payer: Medicare HMO | Source: Ambulatory Visit | Attending: Orthopaedic Surgery | Admitting: Orthopaedic Surgery

## 2020-06-18 ENCOUNTER — Other Ambulatory Visit: Payer: Self-pay

## 2020-06-18 DIAGNOSIS — Z01812 Encounter for preprocedural laboratory examination: Secondary | ICD-10-CM | POA: Diagnosis not present

## 2020-06-18 DIAGNOSIS — Z20822 Contact with and (suspected) exposure to covid-19: Secondary | ICD-10-CM | POA: Insufficient documentation

## 2020-06-18 HISTORY — DX: Pneumonia, unspecified organism: J18.9

## 2020-06-18 LAB — CBC
HCT: 38.4 % (ref 36.0–46.0)
Hemoglobin: 12.3 g/dL (ref 12.0–15.0)
MCH: 29.4 pg (ref 26.0–34.0)
MCHC: 32 g/dL (ref 30.0–36.0)
MCV: 91.9 fL (ref 80.0–100.0)
Platelets: 305 10*3/uL (ref 150–400)
RBC: 4.18 MIL/uL (ref 3.87–5.11)
RDW: 13.3 % (ref 11.5–15.5)
WBC: 6.1 10*3/uL (ref 4.0–10.5)
nRBC: 0 % (ref 0.0–0.2)

## 2020-06-18 LAB — SURGICAL PCR SCREEN
MRSA, PCR: NEGATIVE
Staphylococcus aureus: NEGATIVE

## 2020-06-18 LAB — BASIC METABOLIC PANEL
Anion gap: 8 (ref 5–15)
BUN: 24 mg/dL — ABNORMAL HIGH (ref 8–23)
CO2: 28 mmol/L (ref 22–32)
Calcium: 9.3 mg/dL (ref 8.9–10.3)
Chloride: 103 mmol/L (ref 98–111)
Creatinine, Ser: 0.62 mg/dL (ref 0.44–1.00)
GFR calc Af Amer: >60 mL/min (ref >60)
GFR calc non Af Amer: >60 mL/min (ref >60)
Glucose, Bld: 122 mg/dL — ABNORMAL HIGH (ref 70–99)
Potassium: 4.5 mmol/L (ref 3.5–5.1)
Sodium: 139 mmol/L (ref 135–145)

## 2020-06-18 LAB — HEMOGLOBIN A1C
Hgb A1c MFr Bld: 6.2 % — ABNORMAL HIGH (ref 4.8–5.6)
Mean Plasma Glucose: 131.24 mg/dL

## 2020-06-18 LAB — GLUCOSE, CAPILLARY: Glucose-Capillary: 119 mg/dL — ABNORMAL HIGH (ref 70–99)

## 2020-06-18 LAB — SARS CORONAVIRUS 2 (TAT 6-24 HRS): SARS Coronavirus 2: NEGATIVE

## 2020-06-20 NOTE — H&P (Signed)
TOTAL KNEE ADMISSION H&P  Patient is being admitted for left total knee arthroplasty.  Subjective:  Chief Complaint:left knee pain.  HPI: Melissa [name], 69 y.o. female, has a history of pain and functional disability in the left knee due to arthritis and has failed non-surgical conservative treatments for greater than 12 weeks to includeNSAID's and/or analgesics, corticosteriod injections, viscosupplementation injections, flexibility and strengthening excercises, supervised PT with diminished ADL's post treatment, use of assistive devices, weight reduction as appropriate and activity modification.  Onset of symptoms was gradual, starting 3 years ago with gradually worsening course since that time. The patient noted no past surgery on the left knee(s).  Patient currently rates pain in the left knee(s) at 10 out of 10 with activity. Patient has night pain, worsening of pain with activity and weight bearing, pain that interferes with activities of daily living, pain with passive range of motion, crepitus and joint swelling.  Patient has evidence of subchondral sclerosis, periarticular osteophytes and joint space narrowing by imaging studies. There is no active infection.  Patient Active Problem List   Diagnosis Date Noted  . Status post total right knee replacement 03/08/2020  . Pain in right foot 07/05/2017  . Chronic pain of right knee 07/05/2017  . Chronic pain of left knee 07/05/2017  . Unilateral primary osteoarthritis, left knee 07/05/2017  . Unilateral primary osteoarthritis, right knee 07/05/2017  . 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 Arthritis    osteo arthritis  . Hypertension   . Hypothyroidism   . Pneumonia   . Pre-diabetes    Takes metformin  . Walking pneumonia     Past Surgical History:  Procedure Laterality Date  . JOINT REPLACEMENT    . MENISCUS REPAIR     right knee  .  MULTIPLE TOOTH EXTRACTIONS     with implants  . ovaries removed     2003  . TONSILLECTOMY    . TOTAL KNEE ARTHROPLASTY Right 03/08/2020   Procedure: RIGHT TOTAL KNEE ARTHROPLASTY AND LEFT KNEE STEROID INJECTION;  Surgeon: Mcarthur Rossetti, MD;  Location: [facility];  Service: Orthopedics;  Laterality: Right;    No current facility-administered medications for this encounter.   Current Outpatient Medications  Medication Sig Dispense Refill Last Dose  . aspirin EC 81 MG tablet Take 81 mg by mouth daily. Swallow whole.     Marland Kitchen atorvastatin (LIPITOR) 40 MG tablet Take 40 mg by mouth every evening.      . Biotin (BIOTIN 5000) 5 MG CAPS Take 5 mg by mouth daily.     . Calcium Carb-Cholecalciferol 600-500 MG-UNIT CAPS Take 2 tablets by mouth every evening.     . Cholecalciferol (VITAMIN D) 125 MCG (5000 UT) CAPS Take 5,000 Units by mouth every evening.      . Echinacea 400 MG CAPS Take 800 mg by mouth daily.     . Homeopathic Products (LIVER SUPPORT SL) Take 2 tablets by mouth every evening.      Marland Kitchen ibuprofen (ADVIL) 200 MG tablet Take 600 mg by mouth every evening.     Marland Kitchen levothyroxine (SYNTHROID, LEVOTHROID) 75 MCG tablet Take 75 mcg by mouth daily before breakfast.   2   . lisinopril-hydrochlorothiazide (PRINZIDE,ZESTORETIC) 20-25 MG tablet Take 1 tablet by mouth daily.  1   . Magnesium 200 MG TABS Take 400 mg by mouth every evening.     . metFORMIN (GLUCOPHAGE-XR) 750 MG  24 hr tablet Take 1,500 mg by mouth every evening.  1   . Misc Natural Products (OSTEO BI-FLEX ADV TRIPLE ST PO) Take 2 tablets by mouth daily.     Marland Kitchen zinc gluconate 50 MG tablet Take 50 mg by mouth daily.     . methocarbamol (ROBAXIN) 500 MG tablet Take 1 tablet (500 mg total) by mouth every 6 (six) hours as needed for muscle spasms. (Patient not taking: Reported on 06/11/2020) 60 tablet 1 Completed Course at Unknown time  . oxyCODONE (OXY IR/ROXICODONE) 5 MG immediate release tablet Take 1-2 tablets (5-10 mg total) by mouth  every 4 (four) hours as needed for moderate pain (pain score 4-6). (Patient not taking: Reported on 06/11/2020) 30 tablet 0 Completed Course at Unknown time   No Known Allergies  Social History   Tobacco Use  . Smoking status: Former Smoker    Years: 10.00  . Smokeless tobacco: Never Used  . Tobacco comment: quit when 69 years old  Substance Use Topics  . Alcohol use: Yes    Alcohol/week: 2.0 standard drinks    Types: 1 Glasses of wine, 1 Shots of liquor per week    Comment: daily    Family History  Problem Relation Age of Onset  . Breast cancer Maternal Aunt        in 80's     Review of Systems  Musculoskeletal: Positive for gait problem and joint swelling.  All other systems reviewed and are negative.   Objective:  Physical Exam Vitals reviewed.  Constitutional:      Appearance: Normal appearance.  HENT:     Head: Normocephalic and atraumatic.  Eyes:     Extraocular Movements: Extraocular movements intact.     Pupils: Pupils are equal, round, and reactive to light.  Cardiovascular:     Rate and Rhythm: Normal rate and regular rhythm.  Pulmonary:     Effort: Pulmonary effort is normal.  Abdominal:     Palpations: Abdomen is soft.  Musculoskeletal:     Cervical back: Normal range of motion and neck supple.     Left knee: Effusion, bony tenderness and crepitus present. Decreased range of motion. Tenderness present over the medial joint line and lateral joint line. Abnormal alignment and abnormal meniscus.  Neurological:     Mental Status: She is alert and oriented to person, place, and time.  Psychiatric:        Behavior: Behavior normal.     Vital signs in last 24 hours:    Labs:   Estimated body mass index is 35.6 kg/m as calculated from the following:   Height as of 06/18/20: 5\' 4"  (1.626 m).   Weight as of 06/18/20: 94.1 kg.   Imaging Review Plain radiographs demonstrate severe degenerative joint disease of the left knee(s). The overall alignment  ismild varus. The bone quality appears to be good for age and reported activity level.      Assessment/Plan:  End stage arthritis, left knee   The patient history, physical examination, clinical judgment of the provider and imaging studies are consistent with end stage degenerative joint disease of the left knee(s) and total knee arthroplasty is deemed medically necessary. The treatment options including medical management, injection therapy arthroscopy and arthroplasty were discussed at length. The risks and benefits of total knee arthroplasty were presented and reviewed. The risks due to aseptic loosening, infection, stiffness, patella tracking problems, thromboembolic complications and other imponderables were discussed. The patient acknowledged the explanation, agreed to proceed with  the plan and consent was signed. Patient is being admitted for inpatient treatment for surgery, pain control, PT, OT, prophylactic antibiotics, VTE prophylaxis, progressive ambulation and ADL's and discharge planning. The patient is planning to be discharged home with home health services

## 2020-06-21 ENCOUNTER — Encounter (HOSPITAL_COMMUNITY): Payer: Self-pay | Admitting: Orthopaedic Surgery

## 2020-06-21 ENCOUNTER — Ambulatory Visit (HOSPITAL_COMMUNITY): Payer: Medicare HMO | Admitting: Anesthesiology

## 2020-06-21 ENCOUNTER — Observation Stay (HOSPITAL_COMMUNITY): Payer: Medicare HMO

## 2020-06-21 ENCOUNTER — Inpatient Hospital Stay (HOSPITAL_COMMUNITY)
Admission: AD | Admit: 2020-06-21 | Discharge: 2020-06-23 | DRG: 470 | Disposition: A | Discharge status: Home Health | Payer: Medicare HMO | Attending: Orthopaedic Surgery | Admitting: Orthopaedic Surgery

## 2020-06-21 ENCOUNTER — Encounter (HOSPITAL_COMMUNITY)
Admission: AD | Disposition: A | Discharge status: Home Health | Payer: Self-pay | Source: Home / Self Care | Attending: Orthopaedic Surgery

## 2020-06-21 ENCOUNTER — Other Ambulatory Visit: Payer: Self-pay

## 2020-06-21 DIAGNOSIS — Z471 Aftercare following joint replacement surgery: Secondary | ICD-10-CM | POA: Diagnosis not present

## 2020-06-21 DIAGNOSIS — Z791 Long term (current) use of non-steroidal anti-inflammatories (NSAID): Secondary | ICD-10-CM | POA: Diagnosis not present

## 2020-06-21 DIAGNOSIS — D62 Acute posthemorrhagic anemia: Secondary | ICD-10-CM | POA: Diagnosis not present

## 2020-06-21 DIAGNOSIS — M25762 Osteophyte, left knee: Secondary | ICD-10-CM | POA: Diagnosis not present

## 2020-06-21 DIAGNOSIS — Z87891 Personal history of nicotine dependence: Secondary | ICD-10-CM

## 2020-06-21 DIAGNOSIS — Z7982 Long term (current) use of aspirin: Secondary | ICD-10-CM

## 2020-06-21 DIAGNOSIS — Z6835 Body mass index (BMI) 35.0-35.9, adult: Secondary | ICD-10-CM

## 2020-06-21 DIAGNOSIS — Z79890 Hormone replacement therapy (postmenopausal): Secondary | ICD-10-CM | POA: Diagnosis not present

## 2020-06-21 DIAGNOSIS — Z96652 Presence of left artificial knee joint: Secondary | ICD-10-CM

## 2020-06-21 DIAGNOSIS — E119 Type 2 diabetes mellitus without complications: Secondary | ICD-10-CM | POA: Diagnosis not present

## 2020-06-21 DIAGNOSIS — R600 Localized edema: Secondary | ICD-10-CM | POA: Diagnosis not present

## 2020-06-21 DIAGNOSIS — G8929 Other chronic pain: Secondary | ICD-10-CM | POA: Diagnosis not present

## 2020-06-21 DIAGNOSIS — F419 Anxiety disorder, unspecified: Secondary | ICD-10-CM | POA: Diagnosis not present

## 2020-06-21 DIAGNOSIS — R7303 Prediabetes: Secondary | ICD-10-CM | POA: Diagnosis present

## 2020-06-21 DIAGNOSIS — M1712 Unilateral primary osteoarthritis, left knee: Principal | ICD-10-CM | POA: Diagnosis present

## 2020-06-21 DIAGNOSIS — I10 Essential (primary) hypertension: Secondary | ICD-10-CM | POA: Diagnosis present

## 2020-06-21 DIAGNOSIS — M170 Bilateral primary osteoarthritis of knee: Secondary | ICD-10-CM | POA: Diagnosis not present

## 2020-06-21 DIAGNOSIS — E669 Obesity, unspecified: Secondary | ICD-10-CM | POA: Diagnosis present

## 2020-06-21 DIAGNOSIS — Z803 Family history of malignant neoplasm of breast: Secondary | ICD-10-CM

## 2020-06-21 DIAGNOSIS — Z79899 Other long term (current) drug therapy: Secondary | ICD-10-CM

## 2020-06-21 DIAGNOSIS — Z96651 Presence of right artificial knee joint: Secondary | ICD-10-CM | POA: Diagnosis not present

## 2020-06-21 DIAGNOSIS — Z7984 Long term (current) use of oral hypoglycemic drugs: Secondary | ICD-10-CM | POA: Diagnosis not present

## 2020-06-21 DIAGNOSIS — E039 Hypothyroidism, unspecified: Secondary | ICD-10-CM | POA: Diagnosis present

## 2020-06-21 DIAGNOSIS — Z9889 Other specified postprocedural states: Secondary | ICD-10-CM | POA: Diagnosis not present

## 2020-06-21 DIAGNOSIS — G8918 Other acute postprocedural pain: Secondary | ICD-10-CM | POA: Diagnosis not present

## 2020-06-21 HISTORY — PX: TOTAL KNEE ARTHROPLASTY: SHX125

## 2020-06-21 LAB — TYPE AND SCREEN
ABO/RH(D): B POS
Antibody Screen: NEGATIVE

## 2020-06-21 LAB — GLUCOSE, CAPILLARY: Glucose-Capillary: 130 mg/dL — ABNORMAL HIGH (ref 70–99)

## 2020-06-21 LAB — ABO/RH: ABO/RH(D): B POS

## 2020-06-21 SURGERY — ARTHROPLASTY, KNEE, TOTAL
Anesthesia: Spinal | Site: Knee | Laterality: Left

## 2020-06-21 MED ORDER — METHOCARBAMOL 500 MG IVPB - SIMPLE MED
500.0000 mg | Freq: Four times a day (QID) | INTRAVENOUS | Status: DC | PRN
  Filled 2020-06-21: qty 50

## 2020-06-21 MED ORDER — ZINC GLUCONATE 50 MG PO TABS: 50.0000 mg | ORAL_TABLET | Freq: Every day | ORAL | Status: DC

## 2020-06-21 MED ORDER — ROPIVACAINE HCL 7.5 MG/ML IJ SOLN
INTRAMUSCULAR | Status: DC | PRN
  Administered 2020-06-21: 20 mL via PERINEURAL

## 2020-06-21 MED ORDER — ACETAMINOPHEN 10 MG/ML IV SOLN: 1000.0000 mg | Freq: Once | INTRAVENOUS | Status: DC | PRN

## 2020-06-21 MED ORDER — PHENYLEPHRINE HCL-NACL 10-0.9 MG/250ML-% IV SOLN
INTRAVENOUS | Status: DC | PRN
  Administered 2020-06-21: 40 ug/min via INTRAVENOUS

## 2020-06-21 MED ORDER — BUPIVACAINE-EPINEPHRINE (PF) 0.25% -1:200000 IJ SOLN
INTRAMUSCULAR | Status: AC
  Filled 2020-06-21: qty 30

## 2020-06-21 MED ORDER — ORAL CARE MOUTH RINSE: 15.0000 mL | Freq: Once | OROMUCOSAL | Status: AC

## 2020-06-21 MED ORDER — PROPOFOL 10 MG/ML IV BOLUS
INTRAVENOUS | Status: DC | PRN
  Administered 2020-06-21: 30 mg via INTRAVENOUS

## 2020-06-21 MED ORDER — ONDANSETRON HCL 4 MG/2ML IJ SOLN: 4.0000 mg | Freq: Four times a day (QID) | INTRAMUSCULAR | Status: DC | PRN

## 2020-06-21 MED ORDER — BUPIVACAINE-EPINEPHRINE 0.25% -1:200000 IJ SOLN
INTRAMUSCULAR | Status: DC | PRN
  Administered 2020-06-21: 30 mL

## 2020-06-21 MED ORDER — PHENYLEPHRINE 40 MCG/ML (10ML) SYRINGE FOR IV PUSH (FOR BLOOD PRESSURE SUPPORT)
PREFILLED_SYRINGE | INTRAVENOUS | Status: AC
  Filled 2020-06-21: qty 10

## 2020-06-21 MED ORDER — DOCUSATE SODIUM 100 MG PO CAPS
100.0000 mg | ORAL_CAPSULE | Freq: Two times a day (BID) | ORAL | Status: DC
  Administered 2020-06-21 – 2020-06-23 (×4): 100 mg via ORAL
  Filled 2020-06-21 (×4): qty 1

## 2020-06-21 MED ORDER — ACETAMINOPHEN 325 MG PO TABS: 325.0000 mg | ORAL_TABLET | Freq: Four times a day (QID) | ORAL | Status: DC | PRN

## 2020-06-21 MED ORDER — CALCIUM CARB-CHOLECALCIFEROL 600-500 MG-UNIT PO CAPS: 2.0000 | ORAL_CAPSULE | Freq: Every evening | ORAL | Status: DC

## 2020-06-21 MED ORDER — CALCIUM CARBONATE-VITAMIN D 500-200 MG-UNIT PO TABS
2.0000 | ORAL_TABLET | Freq: Every day | ORAL | Status: DC
  Administered 2020-06-21 – 2020-06-22 (×2): 2 via ORAL
  Filled 2020-06-21 (×2): qty 2

## 2020-06-21 MED ORDER — PROPOFOL 500 MG/50ML IV EMUL
INTRAVENOUS | Status: AC
  Filled 2020-06-21: qty 50

## 2020-06-21 MED ORDER — PROMETHAZINE HCL 25 MG/ML IJ SOLN: 6.2500 mg | INTRAMUSCULAR | Status: DC | PRN

## 2020-06-21 MED ORDER — POVIDONE-IODINE 10 % EX SWAB
2.0000 "application " | Freq: Once | CUTANEOUS | Status: AC
  Administered 2020-06-21: 2 via TOPICAL

## 2020-06-21 MED ORDER — MEPERIDINE HCL 50 MG/ML IJ SOLN: 6.2500 mg | INTRAMUSCULAR | Status: DC | PRN

## 2020-06-21 MED ORDER — ACETAMINOPHEN 325 MG PO TABS: 325.0000 mg | ORAL_TABLET | Freq: Once | ORAL | Status: DC | PRN

## 2020-06-21 MED ORDER — ONDANSETRON HCL 4 MG/2ML IJ SOLN
INTRAMUSCULAR | Status: DC | PRN
  Administered 2020-06-21: 4 mg via INTRAVENOUS

## 2020-06-21 MED ORDER — TRANEXAMIC ACID-NACL 1000-0.7 MG/100ML-% IV SOLN
1000.0000 mg | INTRAVENOUS | Status: AC
  Administered 2020-06-21: 1000 mg via INTRAVENOUS
  Filled 2020-06-21: qty 100

## 2020-06-21 MED ORDER — PROPOFOL 500 MG/50ML IV EMUL
INTRAVENOUS | Status: DC | PRN
  Administered 2020-06-21: 100 ug/kg/min via INTRAVENOUS

## 2020-06-21 MED ORDER — CEFAZOLIN SODIUM-DEXTROSE 2-4 GM/100ML-% IV SOLN
2.0000 g | INTRAVENOUS | Status: AC
  Administered 2020-06-21: 2 g via INTRAVENOUS
  Filled 2020-06-21: qty 100

## 2020-06-21 MED ORDER — HYDROMORPHONE HCL 1 MG/ML IJ SOLN
0.2500 mg | INTRAMUSCULAR | Status: DC | PRN
  Administered 2020-06-21 (×2): 0.5 mg via INTRAVENOUS

## 2020-06-21 MED ORDER — MAGNESIUM 200 MG PO TABS: 400.0000 mg | ORAL_TABLET | Freq: Every evening | ORAL | Status: DC

## 2020-06-21 MED ORDER — ACETAMINOPHEN 160 MG/5ML PO SOLN: 325.0000 mg | Freq: Once | ORAL | Status: DC | PRN

## 2020-06-21 MED ORDER — MAGNESIUM OXIDE 400 (241.3 MG) MG PO TABS
400.0000 mg | ORAL_TABLET | Freq: Every day | ORAL | Status: DC
  Administered 2020-06-22 – 2020-06-23 (×2): 400 mg via ORAL
  Filled 2020-06-21 (×2): qty 1

## 2020-06-21 MED ORDER — ALUM & MAG HYDROXIDE-SIMETH 200-200-20 MG/5ML PO SUSP: 30.0000 mL | ORAL | Status: DC | PRN

## 2020-06-21 MED ORDER — PANTOPRAZOLE SODIUM 40 MG PO TBEC
40.0000 mg | DELAYED_RELEASE_TABLET | Freq: Every day | ORAL | Status: DC
  Administered 2020-06-21 – 2020-06-23 (×3): 40 mg via ORAL
  Filled 2020-06-21 (×3): qty 1

## 2020-06-21 MED ORDER — VITAMIN D 125 MCG (5000 UT) PO CAPS: 5000.0000 [IU] | ORAL_CAPSULE | Freq: Every evening | ORAL | Status: DC

## 2020-06-21 MED ORDER — METFORMIN HCL ER 500 MG PO TB24
1500.0000 mg | ORAL_TABLET | Freq: Every day | ORAL | Status: DC
  Administered 2020-06-21 – 2020-06-22 (×2): 1500 mg via ORAL
  Filled 2020-06-21 (×3): qty 3

## 2020-06-21 MED ORDER — HYDROMORPHONE HCL 2 MG PO TABS
4.0000 mg | ORAL_TABLET | ORAL | Status: DC | PRN
  Administered 2020-06-22 – 2020-06-23 (×7): 4 mg via ORAL
  Filled 2020-06-21 (×7): qty 2

## 2020-06-21 MED ORDER — LACTATED RINGERS IV SOLN: INTRAVENOUS | Status: DC

## 2020-06-21 MED ORDER — BIOTIN 5 MG PO CAPS: 5.0000 mg | ORAL_CAPSULE | Freq: Every day | ORAL | Status: DC

## 2020-06-21 MED ORDER — DIPHENHYDRAMINE HCL 50 MG/ML IJ SOLN
INTRAMUSCULAR | Status: DC | PRN
  Administered 2020-06-21: 6.25 mg via INTRAVENOUS

## 2020-06-21 MED ORDER — METOCLOPRAMIDE HCL 5 MG PO TABS: 5.0000 mg | ORAL_TABLET | Freq: Three times a day (TID) | ORAL | Status: DC | PRN

## 2020-06-21 MED ORDER — SODIUM CHLORIDE 0.9 % IV SOLN: INTRAVENOUS | Status: DC

## 2020-06-21 MED ORDER — ASPIRIN EC 325 MG PO TBEC
325.0000 mg | DELAYED_RELEASE_TABLET | Freq: Two times a day (BID) | ORAL | Status: DC
  Administered 2020-06-22 – 2020-06-23 (×3): 325 mg via ORAL
  Filled 2020-06-21 (×3): qty 1

## 2020-06-21 MED ORDER — VITAMIN D 25 MCG (1000 UNIT) PO TABS
5000.0000 [IU] | ORAL_TABLET | Freq: Every day | ORAL | Status: DC
  Administered 2020-06-21 – 2020-06-23 (×3): 5000 [IU] via ORAL
  Filled 2020-06-21 (×3): qty 5

## 2020-06-21 MED ORDER — DIPHENHYDRAMINE HCL 12.5 MG/5ML PO ELIX: 12.5000 mg | ORAL_SOLUTION | ORAL | Status: DC | PRN

## 2020-06-21 MED ORDER — BUPIVACAINE IN DEXTROSE 0.75-8.25 % IT SOLN
INTRATHECAL | Status: DC | PRN
  Administered 2020-06-21: 1.7 mL via INTRATHECAL

## 2020-06-21 MED ORDER — MIDAZOLAM HCL 2 MG/2ML IJ SOLN
1.0000 mg | INTRAMUSCULAR | Status: AC
  Administered 2020-06-21: 2 mg via INTRAVENOUS
  Filled 2020-06-21: qty 2

## 2020-06-21 MED ORDER — OXYCODONE HCL 5 MG PO TABS
5.0000 mg | ORAL_TABLET | ORAL | Status: DC | PRN
  Administered 2020-06-21 (×2): 10 mg via ORAL
  Administered 2020-06-21: 5 mg via ORAL
  Filled 2020-06-21: qty 1
  Filled 2020-06-21: qty 2

## 2020-06-21 MED ORDER — ONDANSETRON HCL 4 MG PO TABS: 4.0000 mg | ORAL_TABLET | Freq: Four times a day (QID) | ORAL | Status: DC | PRN

## 2020-06-21 MED ORDER — 0.9 % SODIUM CHLORIDE (POUR BTL) OPTIME
TOPICAL | Status: DC | PRN
  Administered 2020-06-21: 1000 mL

## 2020-06-21 MED ORDER — DEXAMETHASONE SODIUM PHOSPHATE 10 MG/ML IJ SOLN
INTRAMUSCULAR | Status: DC | PRN
  Administered 2020-06-21: 5 mg via INTRAVENOUS

## 2020-06-21 MED ORDER — STERILE WATER FOR IRRIGATION IR SOLN
Status: DC | PRN
  Administered 2020-06-21: 2000 mL

## 2020-06-21 MED ORDER — PHENOL 1.4 % MT LIQD: 1.0000 | OROMUCOSAL | Status: DC | PRN

## 2020-06-21 MED ORDER — MENTHOL 3 MG MT LOZG: 1.0000 | LOZENGE | OROMUCOSAL | Status: DC | PRN

## 2020-06-21 MED ORDER — HYDROMORPHONE HCL 1 MG/ML IJ SOLN
0.5000 mg | INTRAMUSCULAR | Status: DC | PRN
  Administered 2020-06-22 (×2): 1 mg via INTRAVENOUS
  Filled 2020-06-21 (×2): qty 1

## 2020-06-21 MED ORDER — GABAPENTIN 100 MG PO CAPS
100.0000 mg | ORAL_CAPSULE | Freq: Three times a day (TID) | ORAL | Status: DC
  Administered 2020-06-21 – 2020-06-23 (×6): 100 mg via ORAL
  Filled 2020-06-21 (×6): qty 1

## 2020-06-21 MED ORDER — CEFAZOLIN SODIUM-DEXTROSE 1-4 GM/50ML-% IV SOLN
1.0000 g | Freq: Four times a day (QID) | INTRAVENOUS | Status: AC
  Administered 2020-06-21 (×2): 1 g via INTRAVENOUS
  Filled 2020-06-21 (×2): qty 50

## 2020-06-21 MED ORDER — ATORVASTATIN CALCIUM 40 MG PO TABS
40.0000 mg | ORAL_TABLET | Freq: Every evening | ORAL | Status: DC
  Administered 2020-06-21 – 2020-06-22 (×2): 40 mg via ORAL
  Filled 2020-06-21 (×2): qty 1

## 2020-06-21 MED ORDER — SODIUM CHLORIDE 0.9 % IR SOLN
Status: DC | PRN
  Administered 2020-06-21: 1000 mL

## 2020-06-21 MED ORDER — OXYCODONE HCL 5 MG PO TABS
10.0000 mg | ORAL_TABLET | ORAL | Status: DC | PRN
  Administered 2020-06-22: 15 mg via ORAL
  Filled 2020-06-21: qty 3
  Filled 2020-06-21: qty 2

## 2020-06-21 MED ORDER — CHLORHEXIDINE GLUCONATE 0.12 % MT SOLN
15.0000 mL | Freq: Once | OROMUCOSAL | Status: AC
  Administered 2020-06-21: 15 mL via OROMUCOSAL

## 2020-06-21 MED ORDER — METOCLOPRAMIDE HCL 5 MG/ML IJ SOLN: 5.0000 mg | Freq: Three times a day (TID) | INTRAMUSCULAR | Status: DC | PRN

## 2020-06-21 MED ORDER — LEVOTHYROXINE SODIUM 75 MCG PO TABS
75.0000 ug | ORAL_TABLET | Freq: Every day | ORAL | Status: DC
  Administered 2020-06-22 – 2020-06-23 (×2): 75 ug via ORAL
  Filled 2020-06-21 (×2): qty 1

## 2020-06-21 MED ORDER — METHOCARBAMOL 500 MG PO TABS
500.0000 mg | ORAL_TABLET | Freq: Four times a day (QID) | ORAL | Status: DC | PRN
  Administered 2020-06-21 – 2020-06-22 (×2): 500 mg via ORAL
  Filled 2020-06-21 (×2): qty 1

## 2020-06-21 MED ORDER — HYDROMORPHONE HCL 1 MG/ML IJ SOLN
INTRAMUSCULAR | Status: AC
  Filled 2020-06-21: qty 1

## 2020-06-21 MED ORDER — FENTANYL CITRATE (PF) 100 MCG/2ML IJ SOLN
50.0000 ug | INTRAMUSCULAR | Status: AC
  Administered 2020-06-21: 50 ug via INTRAVENOUS
  Filled 2020-06-21: qty 2

## 2020-06-21 MED ORDER — DIPHENHYDRAMINE HCL 50 MG/ML IJ SOLN
INTRAMUSCULAR | Status: AC
  Filled 2020-06-21: qty 1

## 2020-06-21 SURGICAL SUPPLY — 58 items
BAG ZIPLOCK 12X15 (MISCELLANEOUS) IMPLANT
BASEPLATE TIBIAL TRIATHALON 3 (Plate) ×2 IMPLANT
BEARIN INSERT TIBIAL SZ 3 11 (Insert) ×2 IMPLANT
BEARING INSERT TIBIAL SZ 3 11 (Insert) ×1 IMPLANT
BENZOIN TINCTURE PRP APPL 2/3 (GAUZE/BANDAGES/DRESSINGS) IMPLANT
BLADE SAG 18X100X1.27 (BLADE) IMPLANT
BLADE SURG SZ10 CARB STEEL (BLADE) ×4 IMPLANT
BNDG ELASTIC 6X5.8 VLCR STR LF (GAUZE/BANDAGES/DRESSINGS) ×2 IMPLANT
BOWL SMART MIX CTS (DISPOSABLE) ×2 IMPLANT
CEMENT BONE SIMPLEX SPEEDSET (Cement) ×4 IMPLANT
COVER SURGICAL LIGHT HANDLE (MISCELLANEOUS) ×2 IMPLANT
COVER WAND RF STERILE (DRAPES) IMPLANT
CUFF TOURN SGL QUICK 34 (TOURNIQUET CUFF) ×1
CUFF TRNQT CYL 34X4.125X (TOURNIQUET CUFF) ×1 IMPLANT
DECANTER SPIKE VIAL GLASS SM (MISCELLANEOUS) ×2 IMPLANT
DRAPE U-SHAPE 47X51 STRL (DRAPES) ×2 IMPLANT
DRSG PAD ABDOMINAL 8X10 ST (GAUZE/BANDAGES/DRESSINGS) ×4 IMPLANT
DURAPREP 26ML APPLICATOR (WOUND CARE) ×2 IMPLANT
ELECT BLADE TIP CTD 4 INCH (ELECTRODE) ×2 IMPLANT
ELECT REM PT RETURN 15FT ADLT (MISCELLANEOUS) ×2 IMPLANT
FEMORAL PEG DISTAL FIXATION (Orthopedic Implant) ×2 IMPLANT
FEMORAL TRIATH POST STAB  SZ3 (Orthopedic Implant) ×1 IMPLANT
FEMORAL TRIATH POST STAB SZ3 (Orthopedic Implant) ×1 IMPLANT
GAUZE SPONGE 4X4 12PLY STRL (GAUZE/BANDAGES/DRESSINGS) ×2 IMPLANT
GAUZE XEROFORM 1X8 LF (GAUZE/BANDAGES/DRESSINGS) ×2 IMPLANT
GLOVE BIO SURGEON STRL SZ7.5 (GLOVE) ×2 IMPLANT
GLOVE BIOGEL PI IND STRL 8 (GLOVE) ×2 IMPLANT
GLOVE BIOGEL PI INDICATOR 8 (GLOVE) ×2
GLOVE ECLIPSE 8.0 STRL XLNG CF (GLOVE) ×2 IMPLANT
GOWN STRL REUS W/TWL XL LVL3 (GOWN DISPOSABLE) ×4 IMPLANT
HANDPIECE INTERPULSE COAX TIP (DISPOSABLE) ×1
HOLDER FOLEY CATH W/STRAP (MISCELLANEOUS) ×2 IMPLANT
IMMOBILIZER KNEE 20 (SOFTGOODS) ×2
IMMOBILIZER KNEE 20 THIGH 36 (SOFTGOODS) ×1 IMPLANT
KIT TURNOVER KIT A (KITS) IMPLANT
NS IRRIG 1000ML POUR BTL (IV SOLUTION) ×2 IMPLANT
PACK TOTAL KNEE CUSTOM (KITS) ×2 IMPLANT
PADDING CAST ABS 6INX4YD NS (CAST SUPPLIES) ×2
PADDING CAST ABS COTTON 6X4 NS (CAST SUPPLIES) ×2 IMPLANT
PADDING CAST COTTON 6X4 STRL (CAST SUPPLIES) ×4 IMPLANT
PATELLA TRIATHLON SZ 29 9 MM (Orthopedic Implant) ×2 IMPLANT
PENCIL SMOKE EVACUATOR (MISCELLANEOUS) IMPLANT
PIN FLUTED HEDLESS FIX 3.5X1/8 (PIN) ×2 IMPLANT
PROTECTOR NERVE ULNAR (MISCELLANEOUS) ×2 IMPLANT
SET HNDPC FAN SPRY TIP SCT (DISPOSABLE) ×1 IMPLANT
SET PAD KNEE POSITIONER (MISCELLANEOUS) ×2 IMPLANT
STAPLER VISISTAT 35W (STAPLE) IMPLANT
STRIP CLOSURE SKIN 1/2X4 (GAUZE/BANDAGES/DRESSINGS) IMPLANT
SUT MNCRL AB 4-0 PS2 18 (SUTURE) IMPLANT
SUT VIC AB 0 CT1 27 (SUTURE) ×1
SUT VIC AB 0 CT1 27XBRD ANTBC (SUTURE) ×1 IMPLANT
SUT VIC AB 1 CT1 36 (SUTURE) ×4 IMPLANT
SUT VIC AB 2-0 CT1 27 (SUTURE) ×2
SUT VIC AB 2-0 CT1 TAPERPNT 27 (SUTURE) ×2 IMPLANT
TRAY FOLEY SLVR 14FR TEMP STAT (SET/KITS/TRAYS/PACK) ×2 IMPLANT
WATER STERILE IRR 1000ML POUR (IV SOLUTION) ×4 IMPLANT
WRAP KNEE MAXI GEL POST OP (GAUZE/BANDAGES/DRESSINGS) ×2 IMPLANT
YANKAUER SUCT BULB TIP 10FT TU (MISCELLANEOUS) ×2 IMPLANT

## 2020-06-21 NOTE — Progress Notes (Signed)
Assisted Dr. Hollis with left, ultrasound guided, adductor canal block. Side rails up, monitors on throughout procedure. See vital signs in flow sheet. Tolerated Procedure well.  

## 2020-06-21 NOTE — Brief Op Note (Signed)
06/21/2020  12:33 PM  PATIENT:  [name]  69 y.o. female  PRE-OPERATIVE DIAGNOSIS:  Osteoarthritis Left Knee  POST-OPERATIVE DIAGNOSIS:  Osteoarthritis Left Knee  PROCEDURE:  Procedure(s): LEFT TOTAL KNEE ARTHROPLASTY (Left)  SURGEON:  Surgeon(s) and Role:    Mcarthur Rossetti, MD - Primary  PHYSICIAN ASSISTANT: Benita Stabile, PA-C  ANESTHESIA:   local, regional and spinal  COUNTS:  YES  TOURNIQUET:   Total Tourniquet Time Documented: Thigh (Left) - 47 minutes Total: Thigh (Left) - 47 minutes   DICTATION: .Other Dictation: Dictation Number [id number]  PLAN OF CARE: Admit for overnight observation  PATIENT DISPOSITION:  PACU - hemodynamically stable.   Delay start of Pharmacological VTE agent (>24hrs) due to surgical blood loss or risk of bleeding: no

## 2020-06-21 NOTE — Interval H&P Note (Signed)
History and Physical Interval Note: The patient is here today for scheduled left total knee arthroplasty.  There is been no acute change in her medical status.  See recent H&P.  Having had surgery before on her other knee she is fully aware of the risk and benefits of knee replacement surgery.  All questions and concerns of been answered and addressed.  The left knee has been marked and informed consent obtained.  06/21/2020 9:47 AM  Melissa [name]  has presented today for surgery, with the diagnosis of Osteoarthritis Left Knee.  The various methods of treatment have been discussed with the patient and family. After consideration of risks, benefits and other options for treatment, the patient has consented to  Procedure(s): LEFT TOTAL KNEE ARTHROPLASTY (Left) as a surgical intervention.  The patient's history has been reviewed, patient examined, no change in status, stable for surgery.  I have reviewed the patient's chart and labs.  Questions were answered to the patient's satisfaction.     Mcarthur Rossetti

## 2020-06-21 NOTE — Evaluation (Signed)
Physical Therapy Evaluation Patient Details Name: Melissa [name] MRN: [id number] DOB: [birth year] Today's Date: 06/21/2020   History of Present Illness  Pt s/p L TKR and with hx of R TKR  Clinical Impression  Pt s/p L TKR and presents with decreased L LE strength/ROM and post op pain limiting functional mobility.  Pt should progress to dc home with assist of family and friends.    Follow Up Recommendations Home health PT    Equipment Recommendations  None recommended by PT    Recommendations for Other Services       Precautions / Restrictions Precautions Precautions: Knee;Fall Restrictions Weight Bearing Restrictions: No Other Position/Activity Restrictions: WBAT      Mobility  Bed Mobility Overal bed mobility: Needs Assistance Bed Mobility: Supine to Sit     Supine to sit: Min assist     General bed mobility comments: cues for sequence and assist for L LE  Transfers Overall transfer level: Needs assistance Equipment used: Rolling walker (2 wheeled) Transfers: Sit to/from Stand Sit to Stand: Min assist         General transfer comment: cues for LE management and use of UEs to self assist  Ambulation/Gait Ambulation/Gait assistance: Min assist Gait Distance (Feet): 58 Feet Assistive device: Rolling walker (2 wheeled) Gait Pattern/deviations: Step-to pattern;Decreased step length - right;Decreased step length - left;Shuffle;Trunk flexed;Antalgic Gait velocity: decr   General Gait Details: cues for sequence, posture and position from [employer]            Wheelchair Mobility    Modified Rankin (Stroke Patients Only)       Balance                                             Pertinent Vitals/Pain      Home Living Family/patient expects to be discharged to:: Private residence Living Arrangements: Alone Available Help at Discharge: Family;Other (Comment) Type of Home: House Home Access: Stairs to enter Entrance  Stairs-Rails: None Entrance Stairs-Number of Steps: 3 Home Layout: Two level;Bed/bath upstairs;1/2 bath on main level Home Equipment: Walker - 2 wheels;Bedside commode      Prior Function Level of Independence: Independent               Hand Dominance   Dominant Hand: Right    Extremity/Trunk Assessment   Upper Extremity Assessment Upper Extremity Assessment: Overall WFL for tasks assessed    Lower Extremity Assessment Lower Extremity Assessment: LLE deficits/detail LLE Deficits / Details: IND SLR    Cervical / Trunk Assessment Cervical / Trunk Assessment: Normal  Communication   Communication: No difficulties  Cognition Arousal/Alertness: Awake/alert Behavior During Therapy: WFL for tasks assessed/performed Overall Cognitive Status: Within Functional Limits for tasks assessed                                        General Comments      Exercises Total Joint Exercises Ankle Circles/Pumps: AROM;Both;15 reps;Supine   Assessment/Plan    PT Assessment    PT Problem List         PT Treatment Interventions      PT Goals (Current goals can be found in the Care Plan section)  Acute Rehab PT Goals Patient Stated Goal: Regain IND PT Goal Formulation: With patient  Time For Goal Achievement: 06/28/20 Potential to Achieve Goals: Good    Frequency     Barriers to discharge        Co-evaluation               AM-PAC PT "6 Clicks" Mobility  Outcome Measure Help needed turning from your back to your side while in a flat bed without using bedrails?: A Little Help needed moving from lying on your back to sitting on the side of a flat bed without using bedrails?: A Little Help needed moving to and from a bed to a chair (including a wheelchair)?: A Little Help needed standing up from a chair using your arms (e.g., wheelchair or bedside chair)?: A Little Help needed to walk in hospital room?: A Little Help needed climbing 3-5 steps with a  railing? : A Lot 6 Click Score: 17    End of Session Equipment Utilized During Treatment: Gait belt Activity Tolerance: Patient tolerated treatment well Patient left: in chair;with call bell/phone within reach;with chair alarm set Nurse Communication: Mobility status PT Visit Diagnosis: Difficulty in walking, not elsewhere classified (R26.2)    Time: 5974-1638 PT Time Calculation (min) (ACUTE ONLY): 30 min   Charges:   PT Evaluation $PT Eval Low Complexity: 1 Low PT Treatments $Gait Training: 8-22 mins        [place] Pager [phone] Office [phone]   Libni Fusaro 06/21/2020, 5:38 PM

## 2020-06-21 NOTE — Op Note (Signed)
NAME: Melissa [name], Melissa [name] MEDICAL RECORD ZD:[id number] ACCOUNT [id number] DATE OF BIRTH:[birth year] FACILITY: [facility] LOCATION: [facility] PHYSICIAN:Marieanne Marxen Kerry Fort, MD  OPERATIVE REPORT  DATE OF PROCEDURE:  06/21/2020  PREOPERATIVE DIAGNOSIS:  Primary osteoarthritis and degenerative joint disease, left knee.  POSTOPERATIVE DIAGNOSIS:  Primary osteoarthritis and degenerative joint disease, left knee.  PROCEDURE:  Left total knee arthroplasty.  IMPLANTS:  Stryker cemented knee system with size 3 femur, size 3 tibial tray, 11 mm fixed bearing polyethylene insert, size 29 patellar button.  SURGEON:  Lind Guest. Ninfa Linden, MD  ASSISTANT:  Erskine Emery, PA-C.  ANESTHESIA: 1.  Left lower extremity adductor canal block. 2.  Spinal. 3.  Local with 0.25% Marcaine with epinephrine around the arthrotomy.  ANTIBIOTICS:  Two g IV Ancef.  TOURNIQUET TIME:  Under 1 hour.  ESTIMATED BLOOD LOSS:  Less than 100 mL.  COMPLICATIONS:  None.  INDICATIONS:  The patient is a 69 year old active female well known to me.  She has debilitating arthritis involving both of her knees and actually just replaced her right knee earlier this year.  Her left knee does have severe end-stage arthritis.  She  has tried and failed all forms of conservative treatment.  Her pain is daily and is detrimentally affecting her mobility, her quality of life and here activities of daily living to the point, she does wish to proceed with a left total knee arthroplasty  given the success of her right knee.  Having had this done before, she is fully aware of the risk of acute blood loss anemia, nerve or vessel injury, fracture, infection, DVT, implant failure.  She understands our goals are to decrease pain, improve  mobility and overall improve quality of life.  DESCRIPTION OF PROCEDURE:  After informed consent was obtained, the appropriate left knee was marked.  An adductor canal block was obtained to the left lower  extremity in the holding room.  She was then brought to the operating room and sat up on the  operating table.  Spinal anesthesia was obtained.  She was laid in supine position.  A Foley catheter was placed and then a nonsterile tourniquet was placed around her upper left thigh.  Her left thigh, knee, leg, and ankle were prepped and draped with  DuraPrep and sterile drapes including a sterile stockinette.  Time-out was called to identify correct patient, correct left knee.  I then used an Esmarch to wrap that leg and tourniquet was inflated to 300 mm of pressure.  We then made a direct midline  incision over the patella and carried this proximally and distally.  We dissected down to the knee joint and carried out a medial parapatellar arthrotomy, finding a significant joint effusion and significant periarticular osteophytes and wear throughout  the knee.  We removed periarticular osteophytes from around the knee.  With the knee in a flexed position, we removed remnants of the ACL, PCL, medial and lateral meniscus.  We then used our extramedullary cutting guide for making our proximal tibia cut.   We set this for correct for varus and valgus and neutral slope to take 2 mm off the high side.  We made this cut without difficulty.  We then used an intramedullary guide for the femur and did our distal femoral cut, setting this for a left knee at 5  degrees externally rotated for an 8 mm distal femoral cut.  We made this cut without difficulty and brought the knee back down to full extension.  We removed debris from the  back of the knee and used a 9 mm extension block and she actually just  hyperextended a little.  We then went back to the femur and put our femoral sizing guide based off the epicondylar axis.  Based off this, we chose a size 3 femur, which actually corresponded with her other side.  We put a 4-in-1 cutting block for a size  3 femur, made our anterior and posterior cuts, followed by our chamfer  cuts.  We then made our femoral box cut.  Attention was then turned to the tibia.  We chose a size 3 tibial tray for coverage setting the rotation off the tibial tubercle and the  femur.  We used the keel punch off of this and then we trialed the size 3 tibia, followed by the 3 left femur.  We tried a 9 and 11 mm fixed bearing polyethylene insert.  We were pleased with stability of the knee with the 11 mm insert.  We then made our  patellar cut and drilled 3 holes for a size 29 patellar button.  We removed all instrumentation from the knee and then irrigated the knee with normal saline solution.  I placed Marcaine around the knee arthrotomy.  We then dried the knee real well and  mixed our cement.  With the knee in a flexed position, we cemented our Stryker Triathlon tibial tray size 3, followed by a real size 3 left femur.  Once we removed cement debris from the knee, we placed our 11 mm fixed bearing polyethylene insert and  cemented our patellar button.  We held the knee in a fully extended position and compressed the knee for the cement to harden.  Once it hardened, we let the tourniquet down and hemostasis obtained with electrocautery.  We cleaned more debris from the  knee and then we closed the arthrotomy with interrupted #1 Vicryl suture, followed by 0 Vicryl to close the deep tissue and 2-0 Vicryl to close the subcutaneous tissue.  Staples were used to reapproximate the skin.  Xeroform and Aquacel dressing was  applied.  She was taken to recovery room in stable condition.  All final counts were correct.  There were no complications noted.  Of note, Benita Stabile, PA-C, assisted during the entire case and his assistance was crucial for facilitating all aspects of  this case.  VN/NUANCE  D:06/21/2020 T:06/21/2020 JOB:[id number]

## 2020-06-21 NOTE — Plan of Care (Signed)
Plan of care 

## 2020-06-21 NOTE — Anesthesia Postprocedure Evaluation (Signed)
Anesthesia Post Note  Patient: Melissa [name]  Procedure(s) Performed: LEFT TOTAL KNEE ARTHROPLASTY (Left Knee)     Patient location during evaluation: PACU Anesthesia Type: Spinal Level of consciousness: oriented and awake and alert Pain management: pain level controlled Vital Signs Assessment: post-procedure vital signs reviewed and stable Respiratory status: spontaneous breathing, respiratory function stable and patient connected to nasal cannula oxygen Cardiovascular status: blood pressure returned to baseline and stable Postop Assessment: no headache, no backache and no apparent nausea or vomiting Anesthetic complications: no   No complications documented.  Last Vitals:  Vitals:   06/21/20 1345 06/21/20 1400  BP: (!) 133/84 (!) 132/78  Pulse: 74 65  Resp: 13 12  Temp:    SpO2: 100% 100%    Last Pain:  Vitals:   06/21/20 1400  TempSrc:   PainSc: 3             L Sensory Level: L4-Anterior knee, lower leg (06/21/20 1400) R Sensory Level: L4-Anterior knee, lower leg (06/21/20 1400)  Effie Berkshire

## 2020-06-21 NOTE — Anesthesia Procedure Notes (Signed)
Anesthesia Regional Block: Adductor canal block   Pre-Anesthetic Checklist: ,, timeout performed, Correct Patient, Correct Site, Correct Laterality, Correct Procedure, Correct Position, site marked, Risks and benefits discussed,  Surgical consent,  Pre-op evaluation,  At surgeon's request and post-op pain management  Laterality: Left  Prep: chloraprep       Needles:  Injection technique: Single-shot  Needle Type: Echogenic Stimulator Needle     Needle Length: 9cm  Needle Gauge: 21     Additional Needles:   Procedures:,,,, ultrasound used (permanent image in chart),,,,  Narrative:  Start time: 06/21/2020 10:18 AM End time: 06/21/2020 10:25 AM Injection made incrementally with aspirations every 5 mL.  Performed by: Personally  Anesthesiologist: Effie Berkshire, MD  Additional Notes: Patient tolerated the procedure well. Local anesthetic introduced in an incremental fashion under minimal resistance after negative aspirations. No paresthesias were elicited. After completion of the procedure, no acute issues were identified and patient continued to be monitored by RN.

## 2020-06-21 NOTE — Plan of Care (Signed)
  Problem: Education: Goal: Knowledge of General Education information will improve Description: Including pain rating scale, medication(s)/side effects and non-pharmacologic comfort measures Outcome: Progressing   Problem: Health Behavior/Discharge Planning: Goal: Ability to manage health-related needs will improve Outcome: Progressing   Problem: Nutrition: Goal: Adequate nutrition will be maintained Outcome: Progressing   Problem: Coping: Goal: Level of anxiety will decrease Outcome: Progressing   Problem: Elimination: Goal: Will not experience complications related to urinary retention Outcome: Progressing   Problem: Pain Managment: Goal: General experience of comfort will improve Outcome: Progressing   

## 2020-06-21 NOTE — Anesthesia Procedure Notes (Signed)
Spinal  Patient location during procedure: OR Start time: 06/21/2020 11:08 AM Staffing Performed: resident/CRNA  Resident/CRNA: [place] Tarita Deshmukh C, CRNA Preanesthetic Checklist Completed: patient identified, IV checked, site marked, risks and benefits discussed, surgical consent, monitors and equipment checked, pre-op evaluation and timeout performed Spinal Block Patient position: sitting Prep: DuraPrep and site prepped and draped Patient monitoring: heart rate, cardiac monitor, continuous pulse ox and blood pressure Approach: midline Location: L3-4 Injection technique: single-shot Needle Needle type: Pencan  Needle gauge: 24 G Needle length: 9 cm Assessment Sensory level: T4 Additional Notes IV functioning, monitors applied to pt. Expiration date of kit checked and confirmed to be in date. Sterile prep and drape, hand hygiene and sterile gloved used. Pt was positioned and spine was prepped in sterile fashion. Skin was anesthetized with lidocaine. Free flow of clear CSF obtained prior to injecting local anesthetic into CSF x 1 attempt. Spinal needle aspirated freely following injection. Needle was carefully withdrawn, and pt tolerated procedure well. Loss of motor and sensory on exam post injection.

## 2020-06-21 NOTE — Anesthesia Preprocedure Evaluation (Addendum)
Anesthesia Evaluation  Patient identified by MRN, date of birth, ID band Patient awake    Reviewed: Allergy & Precautions, NPO status , Patient's Chart, lab work & pertinent test results  Airway Mallampati: II  TM Distance: >3 FB Neck ROM: Full    Dental  (+) Teeth Intact, Dental Advisory Given   Pulmonary former smoker,    breath sounds clear to auscultation       Cardiovascular hypertension, Pt. on medications  Rhythm:Regular Rate:Normal     Neuro/Psych negative neurological ROS  negative psych ROS   GI/Hepatic negative GI ROS, Neg liver ROS,   Endo/Other  diabetes, Type 2, Oral Hypoglycemic AgentsHypothyroidism   Renal/GU negative Renal ROS     Musculoskeletal  (+) Arthritis ,   Abdominal (+) + obese,   Peds  Hematology negative hematology ROS (+)   Anesthesia Other Findings   Reproductive/Obstetrics                            Anesthesia Physical Anesthesia Plan  ASA: II  Anesthesia Plan: Spinal   Post-op Pain Management:  Regional for Post-op pain   Induction: Intravenous  PONV Risk Score and Plan: 3 and Dexamethasone, Ondansetron and Propofol infusion  Airway Management Planned: Natural Airway and Simple Face Mask  Additional Equipment: None  Intra-op Plan:   Post-operative Plan:   Informed Consent: I have reviewed the patients History and Physical, chart, labs and discussed the procedure including the risks, benefits and alternatives for the proposed anesthesia with the patient or authorized representative who has indicated his/her understanding and acceptance.       Plan Discussed with: CRNA  Anesthesia Plan Comments: (Lab Results      Component                Value               Date                      WBC                      6.1                 06/18/2020                HGB                      12.3                06/18/2020                HCT                       38.4                06/18/2020                MCV                      91.9                06/18/2020                PLT                      305  06/18/2020           )       Anesthesia Quick Evaluation

## 2020-06-21 NOTE — Progress Notes (Signed)
Orthopedic Tech Progress Note Patient Details:  Melissa [name] 08-05-1951 [id number]  CPM Left Knee CPM Left Knee: On Left Knee Flexion (Degrees): 60 Left Knee Extension (Degrees): 0  Post Interventions Patient Tolerated: Well Instructions Provided: Care of device, Adjustment of device  [name] 06/21/2020, 1:59 PM

## 2020-06-21 NOTE — Transfer of Care (Signed)
Immediate Anesthesia Transfer of Care Note  Patient: Melissa [name]  Procedure(s) Performed: LEFT TOTAL KNEE ARTHROPLASTY (Left Knee)  Patient Location: PACU  Anesthesia Type:Spinal  Level of Consciousness: awake, alert  and oriented  Airway & Oxygen Therapy: Patient Spontanous Breathing and Patient connected to face mask oxygen  Post-op Assessment: Report given to RN and Post -op Vital signs reviewed and stable  Post vital signs: Reviewed and stable  Last Vitals:  Vitals Value Taken Time  BP 127/77 06/21/20 1301  Temp 36.8 C 06/21/20 1300  Pulse 74 06/21/20 1304  Resp 12 06/21/20 1304  SpO2 100 % 06/21/20 1304  Vitals shown include unvalidated device data.  Last Pain:  Vitals:   06/21/20 1300  TempSrc:   PainSc: Asleep         Complications: No complications documented.

## 2020-06-21 NOTE — Progress Notes (Signed)
PHARMACIST - PHYSICIAN ORDER COMMUNICATION  CONCERNING: P&T Medication Policy on Herbal Medications  DESCRIPTION:  This patient's order for:  Biotin and zinc gluconate  has been noted.  This product(s) is classified as an "herbal" or natural product. Due to a lack of definitive safety studies or FDA approval, nonstandard manufacturing practices, plus the potential risk of unknown drug-drug interactions while on inpatient medications, the Pharmacy and Therapeutics Committee does not permit the use of "herbal" or natural products of this type within [facility].   ACTION TAKEN: The pharmacy department is unable to verify this order at this time and your patient has been informed of this safety policy. Please reevaluate patient's clinical condition at discharge and address if the herbal or natural product(s) should be resumed at that time.  Dia Sitter, PharmD, BCPS 06/21/2020 3:16 PM

## 2020-06-22 DIAGNOSIS — G8929 Other chronic pain: Secondary | ICD-10-CM | POA: Diagnosis not present

## 2020-06-22 DIAGNOSIS — E039 Hypothyroidism, unspecified: Secondary | ICD-10-CM | POA: Diagnosis present

## 2020-06-22 DIAGNOSIS — I10 Essential (primary) hypertension: Secondary | ICD-10-CM | POA: Diagnosis present

## 2020-06-22 DIAGNOSIS — Z791 Long term (current) use of non-steroidal anti-inflammatories (NSAID): Secondary | ICD-10-CM | POA: Diagnosis not present

## 2020-06-22 DIAGNOSIS — Z87891 Personal history of nicotine dependence: Secondary | ICD-10-CM | POA: Diagnosis not present

## 2020-06-22 DIAGNOSIS — D62 Acute posthemorrhagic anemia: Secondary | ICD-10-CM | POA: Diagnosis not present

## 2020-06-22 DIAGNOSIS — Z6835 Body mass index (BMI) 35.0-35.9, adult: Secondary | ICD-10-CM | POA: Diagnosis not present

## 2020-06-22 DIAGNOSIS — Z7984 Long term (current) use of oral hypoglycemic drugs: Secondary | ICD-10-CM | POA: Diagnosis not present

## 2020-06-22 DIAGNOSIS — M25762 Osteophyte, left knee: Secondary | ICD-10-CM | POA: Diagnosis not present

## 2020-06-22 DIAGNOSIS — Z79890 Hormone replacement therapy (postmenopausal): Secondary | ICD-10-CM | POA: Diagnosis not present

## 2020-06-22 DIAGNOSIS — R7303 Prediabetes: Secondary | ICD-10-CM | POA: Diagnosis not present

## 2020-06-22 DIAGNOSIS — Z79899 Other long term (current) drug therapy: Secondary | ICD-10-CM | POA: Diagnosis not present

## 2020-06-22 DIAGNOSIS — E669 Obesity, unspecified: Secondary | ICD-10-CM | POA: Diagnosis present

## 2020-06-22 DIAGNOSIS — F419 Anxiety disorder, unspecified: Secondary | ICD-10-CM | POA: Diagnosis not present

## 2020-06-22 DIAGNOSIS — M1712 Unilateral primary osteoarthritis, left knee: Secondary | ICD-10-CM | POA: Diagnosis not present

## 2020-06-22 DIAGNOSIS — Z7982 Long term (current) use of aspirin: Secondary | ICD-10-CM | POA: Diagnosis not present

## 2020-06-22 DIAGNOSIS — Z803 Family history of malignant neoplasm of breast: Secondary | ICD-10-CM | POA: Diagnosis not present

## 2020-06-22 DIAGNOSIS — Z96651 Presence of right artificial knee joint: Secondary | ICD-10-CM | POA: Diagnosis not present

## 2020-06-22 LAB — CBC
HCT: 31.4 % — ABNORMAL LOW (ref 36.0–46.0)
Hemoglobin: 10.2 g/dL — ABNORMAL LOW (ref 12.0–15.0)
MCH: 30.1 pg (ref 26.0–34.0)
MCHC: 32.5 g/dL (ref 30.0–36.0)
MCV: 92.6 fL (ref 80.0–100.0)
Platelets: 270 10*3/uL (ref 150–400)
RBC: 3.39 MIL/uL — ABNORMAL LOW (ref 3.87–5.11)
RDW: 13.3 % (ref 11.5–15.5)
WBC: 9 10*3/uL (ref 4.0–10.5)
nRBC: 0 % (ref 0.0–0.2)

## 2020-06-22 LAB — BASIC METABOLIC PANEL
Anion gap: 23 — ABNORMAL HIGH (ref 5–15)
BUN: 15 mg/dL (ref 8–23)
CO2: 12 mmol/L — ABNORMAL LOW (ref 22–32)
Calcium: 8.9 mg/dL (ref 8.9–10.3)
Chloride: 103 mmol/L (ref 98–111)
Creatinine, Ser: 0.52 mg/dL (ref 0.44–1.00)
GFR calc Af Amer: >60 mL/min (ref >60)
GFR calc non Af Amer: >60 mL/min (ref >60)
Glucose, Bld: 132 mg/dL — ABNORMAL HIGH (ref 70–99)
Potassium: 4 mmol/L (ref 3.5–5.1)
Sodium: 138 mmol/L (ref 135–145)

## 2020-06-22 MED ORDER — LIP MEDEX EX OINT
TOPICAL_OINTMENT | CUTANEOUS | Status: AC
  Filled 2020-06-22: qty 7

## 2020-06-22 NOTE — Progress Notes (Signed)
Physical Therapy Treatment Patient Details Name: Melissa [name] MRN: [id number] DOB: 18-Mar-1951 Today's Date: 06/22/2020    History of Present Illness Pt s/p L TKR and with hx of R TKR    PT Comments    Pt motivated this pm and progressing well with mobility but with elevated anxiety level.   Follow Up Recommendations  Home health PT     Equipment Recommendations  None recommended by PT    Recommendations for Other Services       Precautions / Restrictions Precautions Precautions: Knee;Fall Restrictions Weight Bearing Restrictions: No Other Position/Activity Restrictions: WBAT    Mobility  Bed Mobility Overal bed mobility: Needs Assistance Bed Mobility: Supine to Sit;Sit to Supine     Supine to sit: Min guard Sit to supine: Min guard   General bed mobility comments: min cues for sequence and use of R LE to self assist  Transfers Overall transfer level: Needs assistance Equipment used: Rolling walker (2 wheeled) Transfers: Sit to/from Stand Sit to Stand: Min guard         General transfer comment: cues for LE management and use of UEs to self assist  Ambulation/Gait Ambulation/Gait assistance: Min guard Gait Distance (Feet): 140 Feet Assistive device: Rolling walker (2 wheeled) Gait Pattern/deviations: Step-to pattern;Decreased step length - right;Decreased step length - left;Shuffle;Trunk flexed;Antalgic Gait velocity: decr   General Gait Details: cues for sequence, posture and position from [employer]             Wheelchair Mobility    Modified Rankin (Stroke Patients Only)       Balance Overall balance assessment: Mild deficits observed, not formally tested                                          Cognition Arousal/Alertness: Awake/alert Behavior During Therapy: [facility] for tasks assessed/performed;Anxious Overall Cognitive Status: Within Functional Limits for tasks assessed                                         Exercises Total Joint Exercises Ankle Circles/Pumps: AROM;Both;15 reps;Supine Quad Sets: AROM;Both;10 reps;Supine Heel Slides: AAROM;Left;15 reps;Supine Hip ABduction/ADduction: AAROM;AROM;Left;15 reps;Supine Knee Flexion: AAROM Goniometric ROM: AAROM L knee -5 - 60    General Comments        Pertinent Vitals/Pain Pain Assessment: 0-10 Pain Score: 6  Pain Location: L knee Pain Descriptors / Indicators: Aching;Sore Pain Intervention(s): Limited activity within patient's tolerance;Monitored during session;Premedicated before session;Ice applied    Home Living                      Prior Function            PT Goals (current goals can now be found in the care plan section) Acute Rehab PT Goals Patient Stated Goal: Regain IND PT Goal Formulation: With patient Time For Goal Achievement: 06/28/20 Potential to Achieve Goals: Good Progress towards PT goals: Progressing toward goals    Frequency           PT Plan Current plan remains appropriate    Co-evaluation              AM-PAC PT "6 Clicks" Mobility   Outcome Measure  Help needed turning from your back to your side while in a  flat bed without using bedrails?: A Little Help needed moving from lying on your back to sitting on the side of a flat bed without using bedrails?: A Little Help needed moving to and from a bed to a chair (including a wheelchair)?: A Little Help needed standing up from a chair using your arms (e.g., wheelchair or bedside chair)?: A Little Help needed to walk in hospital room?: A Little Help needed climbing 3-5 steps with a railing? : A Little 6 Click Score: 18    End of Session Equipment Utilized During Treatment: Gait belt Activity Tolerance: Patient tolerated treatment well Patient left: in bed;with call bell/phone within reach;with bed alarm set Nurse Communication: Mobility status PT Visit Diagnosis: Difficulty in walking, not elsewhere classified  (R26.2)     Time: 1025-8527 PT Time Calculation (min) (ACUTE ONLY): 30 min  Charges:  $Gait Training: 8-22 mins $Therapeutic Exercise: 8-22 mins                     [place] Pager [phone] Office [phone]    Lashan Gluth 06/22/2020, 4:35 PM

## 2020-06-22 NOTE — Progress Notes (Signed)
Orthopedic Tech Progress Note Patient Details:  Melissa [name] 1951/01/23 [id number]  Ortho Devices Type of Ortho Device: CPM padding   Post Interventions Patient Tolerated: Well Instructions Provided: Care of device, Adjustment of device   [name] 06/22/2020, 1:47 PM

## 2020-06-22 NOTE — Discharge Instructions (Signed)

## 2020-06-22 NOTE — TOC Initial Note (Signed)
Transition of Care [facility]) - Initial/Assessment Note    Patient Details  Name: Melissa [name] MRN: [id number] Date of Birth: 15-Dec-1950  Transition of Care [facility]) CM/SW Contact:    Joaquin Courts, RN Phone Number: 06/22/2020, 9:44 AM  Clinical Narrative:  CM spoke with patient at bedside.  Patient reports she has RW and 3in1 at home.  [facility] to provide HHPT services.                  Expected Discharge Plan: [place] Barriers to Discharge: Continued Medical Work up   Patient Goals and CMS Choice Patient states their goals for this hospitalization and ongoing recovery are:: to go home with therapy CMS Medicare.gov Compare Post Acute Care list provided to:: Patient Choice offered to / list presented to : Patient  Expected Discharge Plan and Services Expected Discharge Plan: [place]   Discharge Planning Services: CM Consult Post Acute Care Choice: [place] arrangements for the past 2 months: Single Family Home                 DME Arranged: N/A DME Agency: NA       HH Arranged: PT HH Agency: [facility] [employer]     Representative spoke with at [place]: pre-arranged in MD office  Prior Living Arrangements/Services Living arrangements for the past 2 months: [place]   Patient language and need for interpreter reviewed:: Yes Do you feel safe going back to the place where you live?: Yes      Need for Family Participation in Patient Care: Yes (Comment) Care giver support system in place?: Yes (comment)   Criminal Activity/Legal Involvement Pertinent to Current Situation/Hospitalization: No - Comment as needed  Activities of Daily Living Home Assistive Devices/Equipment: Gilford Rile (specify type) ADL Screening (condition at time of admission) Patient's cognitive ability adequate to safely complete daily activities?: Yes Is the patient deaf or have difficulty hearing?: No Does the  patient have difficulty seeing, even when wearing glasses/contacts?: No Does the patient have difficulty concentrating, remembering, or making decisions?: No Patient able to express need for assistance with ADLs?: Yes Does the patient have difficulty dressing or bathing?: No Independently performs ADLs?: Yes (appropriate for developmental age) Does the patient have difficulty walking or climbing stairs?: No Weakness of Legs: None Weakness of Arms/Hands: None  Permission Sought/Granted                  Emotional Assessment Appearance:: Appears stated age Attitude/Demeanor/Rapport: Engaged Affect (typically observed): Accepting Orientation: : Oriented to Self, Oriented to Place, Oriented to Situation, Oriented to  Time   Psych Involvement: No (comment)  Admission diagnosis:  Status post total left knee replacement [Z96.652] Patient Active Problem List   Diagnosis Date Noted  . Status post total left knee replacement 06/21/2020  . Status post total right knee replacement 03/08/2020  . Pain in right foot 07/05/2017  . Chronic pain of right knee 07/05/2017  . Chronic pain of left knee 07/05/2017  . Unilateral primary osteoarthritis, left knee 07/05/2017  . Unilateral primary osteoarthritis, right knee 07/05/2017  . 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, right 04/14/2017   PCP:  Marda Stalker, PA-C Pharmacy:   [id number] - [place], [place] [address] Phone: [phone] Fax: [phone]     Social Determinants of Health (SDOH) Interventions    Readmission Risk Interventions  No flowsheet data found.

## 2020-06-22 NOTE — Plan of Care (Signed)
?  Problem: Nutrition: ?Goal: Adequate nutrition will be maintained ?Outcome: Progressing ?  ?Problem: Coping: ?Goal: Level of anxiety will decrease ?Outcome: Progressing ?  ?Problem: Elimination: ?Goal: Will not experience complications related to urinary retention ?Outcome: Progressing ?  ?

## 2020-06-22 NOTE — Progress Notes (Signed)
Subjective: 1 Day Post-Op Procedure(s) (LRB): LEFT TOTAL KNEE ARTHROPLASTY (Left) Patient reports pain as moderate.  The patient is very tearful and uncertain this morning.  She does have slight acute loss anemia from surgery but her vitals are stable.  Objective: Vital signs in last 24 hours: Temp:  [97.8 F (36.6 C)-98.5 F (36.9 C)] 98.5 F (36.9 C) (07/31 0951) Pulse Rate:  [65-88] 87 (07/31 0951) Resp:  [9-22] 17 (07/31 0951) BP: (114-149)/(50-97) 145/75 (07/31 0951) SpO2:  [95 %-100 %] 100 % (07/31 0951) Weight:  [94.1 kg] 94.1 kg (07/30 1453)  Intake/Output from previous day: 07/30 0701 - 07/31 0700 In: 2718.4 [P.O.:480; I.V.:1938.4; IV Piggyback:300] Out: 2025 [Urine:2025] Intake/Output this shift: No intake/output data recorded.  Recent Labs    06/22/20 0258  HGB 10.2*   Recent Labs    06/22/20 0258  WBC 9.0  RBC 3.39*  HCT 31.4*  PLT 270   Recent Labs    06/22/20 0258  NA 138  K 4.0  CL 103  CO2 12*  BUN 15  CREATININE 0.52  GLUCOSE 132*  CALCIUM 8.9   No results for input(s): LABPT, INR in the last 72 hours.  Sensation intact distally Intact pulses distally Dorsiflexion/Plantar flexion intact Incision: scant drainage No cellulitis present Compartment soft   Assessment/Plan: 1 Day Post-Op Procedure(s) (LRB): LEFT TOTAL KNEE ARTHROPLASTY (Left) Up with therapy Plan for discharge tomorrow Discharge home with home health  I will need to keep her here today due to her anxiety and uncertain [place] with going home given the amount of pain she is having and frustration.  This would certainly make her a fall risk.  Changing her to inpatient admission is medically and reasonably warranted given the need for pain control and extra therapy before discharge home.  Hopefully we can discharge her to home tomorrow.    Mcarthur Rossetti 06/22/2020, 10:02 AM

## 2020-06-22 NOTE — Progress Notes (Signed)
Orthopedic Tech Progress Note Patient Details:  Melissa [name] 07-Feb-1951 [id number]  Ortho Devices Type of Ortho Device: CPM padding   Post Interventions Patient Tolerated: Well Instructions Provided: Care of device, Adjustment of device   [name] 06/22/2020, 5:42 PM

## 2020-06-23 MED ORDER — ASPIRIN 325 MG PO TBEC: 325.0000 mg | DELAYED_RELEASE_TABLET | Freq: Two times a day (BID) | ORAL | 0 refills | Status: DC

## 2020-06-23 MED ORDER — HYDROMORPHONE HCL 4 MG PO TABS: 4.0000 mg | ORAL_TABLET | ORAL | 0 refills | Status: DC | PRN

## 2020-06-23 MED ORDER — METHOCARBAMOL 500 MG PO TABS: 500.0000 mg | ORAL_TABLET | Freq: Four times a day (QID) | ORAL | 1 refills | Status: DC | PRN

## 2020-06-23 NOTE — Progress Notes (Signed)
Physical Therapy Treatment Patient Details Name: Melissa [name] MRN: [id number] DOB: 08/18/1951 Today's Date: 06/23/2020    History of Present Illness Pt s/p L TKR and with hx of R TKR    PT Comments    Pt in better spirits and continues to progress steadily with mobility including negotiating stairs.  Pt hopeful for dc home later today.  Follow Up Recommendations  Home health PT     Equipment Recommendations  None recommended by PT    Recommendations for Other Services       Precautions / Restrictions Precautions Precautions: Knee;Fall Restrictions Weight Bearing Restrictions: No Other Position/Activity Restrictions: WBAT    Mobility  Bed Mobility Overal bed mobility: Needs Assistance Bed Mobility: Supine to Sit;Sit to Supine     Supine to sit: Supervision Sit to supine: Min guard   General bed mobility comments: min cues for sequence and use of R LE to self assist  Transfers Overall transfer level: Needs assistance Equipment used: Rolling walker (2 wheeled) Transfers: Sit to/from Stand Sit to Stand: Supervision         General transfer comment: cues for LE management and use of UEs to self assist  Ambulation/Gait Ambulation/Gait assistance: Min guard;Supervision Gait Distance (Feet): 100 Feet Assistive device: Rolling walker (2 wheeled) Gait Pattern/deviations: Step-to pattern;Decreased step length - right;Decreased step length - left;Shuffle;Trunk flexed;Antalgic Gait velocity: decr   General Gait Details: min cues for sequence, posture and position from RW   Stairs Stairs: Yes Stairs assistance: Min assist Stair Management: No rails;One rail Right;Backwards;Forwards;With walker;With cane Number of Stairs: 6 General stair comments: 2+2 steps bkwd with RW and 2 steps fwd with rail and cane.   Wheelchair Mobility    Modified Rankin (Stroke Patients Only)       Balance Overall balance assessment: Mild deficits observed, not formally  tested                                          Cognition Arousal/Alertness: Awake/alert Behavior During Therapy: WFL for tasks assessed/performed;Anxious Overall Cognitive Status: Within Functional Limits for tasks assessed                                        Exercises Total Joint Exercises Ankle Circles/Pumps: AROM;Both;15 reps;Supine Quad Sets: AROM;Both;Supine;15 reps Heel Slides: AAROM;Left;15 reps;Supine Straight Leg Raises: AAROM;AROM;Left;15 reps;Supine Goniometric ROM: AAROM L knee -5 - 60    General Comments        Pertinent Vitals/Pain Pain Assessment: 0-10 Pain Score: 7  Pain Location: L knee Pain Descriptors / Indicators: Aching;Sore Pain Intervention(s): Limited activity within patient's tolerance;Monitored during session;Premedicated before session;Ice applied    Home Living                      Prior Function            PT Goals (current goals can now be found in the care plan section) Acute Rehab PT Goals Patient Stated Goal: Regain IND PT Goal Formulation: With patient Time For Goal Achievement: 06/28/20 Potential to Achieve Goals: Good Progress towards PT goals: Progressing toward goals    Frequency           PT Plan Current plan remains appropriate    Co-evaluation  AM-PAC PT "6 Clicks" Mobility   Outcome Measure  Help needed turning from your back to your side while in a flat bed without using bedrails?: A Little Help needed moving from lying on your back to sitting on the side of a flat bed without using bedrails?: A Little Help needed moving to and from a bed to a chair (including a wheelchair)?: A Little Help needed standing up from a chair using your arms (e.g., wheelchair or bedside chair)?: A Little Help needed to walk in hospital room?: A Little Help needed climbing 3-5 steps with a railing? : A Little 6 Click Score: 18    End of Session Equipment Utilized  During Treatment: Gait belt Activity Tolerance: Patient tolerated treatment well;Patient limited by fatigue Patient left: in bed;with call bell/phone within reach;with bed alarm set Nurse Communication: Mobility status PT Visit Diagnosis: Difficulty in walking, not elsewhere classified (R26.2)     Time: 3606-7703 PT Time Calculation (min) (ACUTE ONLY): 40 min  Charges:  $Gait Training: 8-22 mins $Therapeutic Exercise: 8-22 mins $Therapeutic Activity: 8-22 mins                     Debe Coder PT Acute Rehabilitation Services Pager [phone] Office [phone]    Skyllar Notarianni 06/23/2020, 9:21 AM

## 2020-06-23 NOTE — Discharge Summary (Signed)
Patient ID: Melissa [name] MRN: [id number] DOB/AGE: 02-20-1951 69 y.o.  Admit date: 06/21/2020 Discharge date: 06/23/2020  Admission Diagnoses:  Principal Problem:   Unilateral primary osteoarthritis, left knee Active Problems:   Status post total left knee replacement   Discharge Diagnoses:  Same  Past Medical History:  Diagnosis Date   Arthritis    osteo arthritis   Hypertension    Hypothyroidism    Pneumonia    Pre-diabetes    Takes metformin   Walking pneumonia     Surgeries: Procedure(s): LEFT TOTAL KNEE ARTHROPLASTY on 06/21/2020   Consultants:   Discharged Condition: Improved  Hospital Course: Melissa [name] is an 69 y.o. female who was admitted 06/21/2020 for operative treatment ofUnilateral primary osteoarthritis, left knee. Patient has severe unremitting pain that affects sleep, daily activities, and work/hobbies. After pre-op clearance the patient was taken to the operating room on 06/21/2020 and underwent  Procedure(s): LEFT TOTAL KNEE ARTHROPLASTY.    Patient was given perioperative antibiotics:  Anti-infectives (From admission, onward)   Start     Dose/Rate Route Frequency Ordered Stop   06/21/20 1700  ceFAZolin (ANCEF) IVPB 1 g/50 mL premix        1 g 100 mL/hr over 30 Minutes Intravenous Every 6 hours 06/21/20 1501 06/21/20 2229   06/21/20 0845  ceFAZolin (ANCEF) IVPB 2g/100 mL premix        2 g 200 mL/hr over 30 Minutes Intravenous On call to O.R. 06/21/20 0844 06/21/20 1115       Patient was given sequential compression devices, early ambulation, and chemoprophylaxis to prevent DVT.  Patient benefited maximally from hospital stay and there were no complications.    Recent vital signs:  Patient Vitals for the past 24 hrs:  BP Temp Temp src Pulse Resp SpO2  06/23/20 0438 (!) 159/80 98.1 F (36.7 C) -- 103 20 100 %  06/22/20 2105 (!) 164/69 99.2 F (37.3 C) -- 100 17 96 %  06/22/20 1415 (!) 145/69 99 F (37.2 C) Oral 90 18 98 %   06/22/20 0951 (!) 145/75 98.5 F (36.9 C) Oral 87 17 100 %     Recent laboratory studies:  Recent Labs    06/22/20 0258  WBC 9.0  HGB 10.2*  HCT 31.4*  PLT 270  NA 138  K 4.0  CL 103  CO2 12*  BUN 15  CREATININE 0.52  GLUCOSE 132*  CALCIUM 8.9     Discharge Medications:   Allergies as of 06/23/2020   No Known Allergies     Medication List    TAKE these medications   aspirin 325 MG EC tablet Take 1 tablet (325 mg total) by mouth 2 (two) times daily after a meal. What changed:   medication strength  how much to take  when to take this  additional instructions   atorvastatin 40 MG tablet Commonly known as: LIPITOR Take 40 mg by mouth every evening.   Biotin 5000 5 MG Caps Generic drug: Biotin Take 5 mg by mouth daily.   Calcium Carb-Cholecalciferol 600-500 MG-UNIT Caps Take 2 tablets by mouth every evening.   Echinacea 400 MG Caps Take 800 mg by mouth daily.   HYDROmorphone 4 MG tablet Commonly known as: DILAUDID Take 1 tablet (4 mg total) by mouth every 4 (four) hours as needed for severe pain.   ibuprofen 200 MG tablet Commonly known as: ADVIL Take 600 mg by mouth every evening.   levothyroxine 75 MCG tablet Commonly known as: SYNTHROID Take  75 mcg by mouth daily before breakfast.   lisinopril-hydrochlorothiazide 20-25 MG tablet Commonly known as: ZESTORETIC Take 1 tablet by mouth daily.   LIVER SUPPORT SL Take 2 tablets by mouth every evening.   Magnesium 200 MG Tabs Take 400 mg by mouth every evening.   metFORMIN 750 MG 24 hr tablet Commonly known as: GLUCOPHAGE-XR Take 1,500 mg by mouth every evening.   methocarbamol 500 MG tablet Commonly known as: ROBAXIN Take 1 tablet (500 mg total) by mouth every 6 (six) hours as needed for muscle spasms.   OSTEO BI-FLEX ADV TRIPLE ST PO Take 2 tablets by mouth daily.   Vitamin D 125 MCG (5000 UT) Caps Take 5,000 Units by mouth every evening.   zinc gluconate 50 MG tablet Take 50 mg  by mouth daily.            Durable Medical Equipment  (From admission, onward)         Start     Ordered   06/21/20 1501  DME 3 n 1  Once        06/21/20 1501   06/21/20 1501  DME Walker rolling  Once       Question Answer Comment  Walker: With 5 Inch Wheels   Patient needs a walker to treat with the following condition Status post total left knee replacement      06/21/20 1501          Diagnostic Studies: DG Knee Left Port  Result Date: 06/21/2020 CLINICAL DATA:  Left knee arthroplasty. EXAM: PORTABLE LEFT KNEE - 1-2 VIEW COMPARISON:  None. FINDINGS: Total left knee arthroplasty in expected alignment. There has been patellar resurfacing. No periprosthetic lucency or fracture. Recent postsurgical change includes air and edema in the joint space and soft tissues. Anterior skin staples IMPRESSION: Left knee arthroplasty without immediate postoperative complication. Electronically Signed   By: Keith Rake M.D.   On: 06/21/2020 18:22    Disposition: Discharge disposition: 01-Home or [place]    Mcarthur Rossetti, MD Follow up in 2 week(s).   Specialty: Orthopedic Surgery Contact information: [place] [address] [phone]        [facility] Follow up.   Specialty: [place] Why: agency will provide home health physical therapy Contact information: [place] [address] [phone]                Signed: Mcarthur Rossetti 06/23/2020, 9:05 AM

## 2020-06-23 NOTE — Progress Notes (Signed)
Physical Therapy Treatment Patient Details Name: Melissa [name] MRN: [id number] DOB: 1951-02-05 Today's Date: 06/23/2020    History of Present Illness Pt s/p L TKR and with hx of R TKR    PT Comments    Pt up to ambulate increased distance in hall and negotiate stairs again - written instruction provided.  Pt eager for dc home.   Follow Up Recommendations  Home health PT     Equipment Recommendations  None recommended by PT    Recommendations for Other Services       Precautions / Restrictions Precautions Precautions: Knee;Fall Restrictions Weight Bearing Restrictions: No Other Position/Activity Restrictions: WBAT    Mobility  Bed Mobility Overal bed mobility: Needs Assistance Bed Mobility: Supine to Sit     Supine to sit: Supervision Sit to supine: Min guard   General bed mobility comments: min cues for sequence and use of R LE to self assist  Transfers Overall transfer level: Needs assistance Equipment used: Rolling walker (2 wheeled) Transfers: Sit to/from Stand Sit to Stand: Supervision         General transfer comment: cues for LE management and use of UEs to self assist  Ambulation/Gait Ambulation/Gait assistance: Min guard;Supervision Gait Distance (Feet): 250 Feet Assistive device: Rolling walker (2 wheeled) Gait Pattern/deviations: Step-to pattern;Step-through pattern;Decreased step length - right;Decreased step length - left;Shuffle;Antalgic Gait velocity: decr   General Gait Details: min cues for sequence, posture and position from RW   Stairs Stairs: Yes Stairs assistance: Min assist Stair Management: No rails;One rail Right;Backwards;Forwards;With walker;With cane Number of Stairs: 4 General stair comments: 2 steps bkwd with RW and 2 steps fwd with rail and cane.   Wheelchair Mobility    Modified Rankin (Stroke Patients Only)       Balance Overall balance assessment: Mild deficits observed, not formally tested                                           Cognition Arousal/Alertness: Awake/alert Behavior During Therapy: WFL for tasks assessed/performed;Anxious Overall Cognitive Status: Within Functional Limits for tasks assessed                                        Exercises Total Joint Exercises Ankle Circles/Pumps: AROM;Both;15 reps;Supine Quad Sets: AROM;Both;Supine;15 reps Heel Slides: AAROM;Left;15 reps;Supine Straight Leg Raises: AAROM;AROM;Left;15 reps;Supine Goniometric ROM: AAROM L knee -5 - 60    General Comments        Pertinent Vitals/Pain Pain Assessment: 0-10 Pain Score: 5  Pain Location: L knee Pain Descriptors / Indicators: Aching;Sore Pain Intervention(s): Limited activity within patient's tolerance;Monitored during session;Premedicated before session    Home Living                      Prior Function            PT Goals (current goals can now be found in the care plan section) Acute Rehab PT Goals Patient Stated Goal: Regain IND PT Goal Formulation: With patient Time For Goal Achievement: 06/28/20 Potential to Achieve Goals: Good Progress towards PT goals: Progressing toward goals    Frequency           PT Plan Current plan remains appropriate    Co-evaluation  AM-PAC PT "6 Clicks" Mobility   Outcome Measure  Help needed turning from your back to your side while in a flat bed without using bedrails?: A Little Help needed moving from lying on your back to sitting on the side of a flat bed without using bedrails?: A Little Help needed moving to and from a bed to a chair (including a wheelchair)?: A Little Help needed standing up from a chair using your arms (e.g., wheelchair or bedside chair)?: A Little Help needed to walk in hospital room?: A Little Help needed climbing 3-5 steps with a railing? : A Little 6 Click Score: 18    End of Session Equipment Utilized During Treatment: Gait belt Activity  Tolerance: Patient tolerated treatment well Patient left: Other (comment) (bathroom) Nurse Communication: Mobility status PT Visit Diagnosis: Difficulty in walking, not elsewhere classified (R26.2)     Time: 0459-9774 PT Time Calculation (min) (ACUTE ONLY): 18 min  Charges:  $Gait Training: 8-22 mins $Therapeutic Exercise: 8-22 mins $Therapeutic Activity: 8-22 mins                     Debe Coder PT Acute Rehabilitation Services Pager [phone] Office [phone]    Melissa [name] 06/23/2020, 12:36 PM

## 2020-06-23 NOTE — Progress Notes (Signed)
Pt provided with d/c instructions. After discussing the pt's plan of care upon d/c home, pt denied any further questions or concerns.  

## 2020-06-23 NOTE — Progress Notes (Signed)
Orthopedic Tech Progress Note Patient Details:  Melissa [name] 1951/02/04 [id number]  Ortho Devices Type of Ortho Device: CPM padding   Post Interventions Patient Tolerated: Well Instructions Provided: Care of device, Adjustment of device   [name] 06/23/2020, 2:14 PM

## 2020-06-23 NOTE — Progress Notes (Signed)
Subjective: 2 Days Post-Op Procedure(s) (LRB): LEFT TOTAL KNEE ARTHROPLASTY (Left) Patient reports pain as moderate.    Objective: Vital signs in last 24 hours: Temp:  [98.1 F (36.7 C)-99.2 F (37.3 C)] 98.1 F (36.7 C) (08/01 0438) Pulse Rate:  [87-103] 103 (08/01 0438) Resp:  [17-20] 20 (08/01 0438) BP: (145-164)/(69-80) 159/80 (08/01 0438) SpO2:  [96 %-100 %] 100 % (08/01 0438)  Intake/Output from previous day: 07/31 0701 - 08/01 0700 In: 1182.5 [P.O.:780; I.V.:402.5] Out: 900 [Urine:900] Intake/Output this shift: Total I/O In: 173.8 [P.O.:120; I.V.:53.8] Out: -   Recent Labs    06/22/20 0258  HGB 10.2*   Recent Labs    06/22/20 0258  WBC 9.0  RBC 3.39*  HCT 31.4*  PLT 270   Recent Labs    06/22/20 0258  NA 138  K 4.0  CL 103  CO2 12*  BUN 15  CREATININE 0.52  GLUCOSE 132*  CALCIUM 8.9   No results for input(s): LABPT, INR in the last 72 hours.  Sensation intact distally Intact pulses distally Dorsiflexion/Plantar flexion intact Incision: scant drainage No cellulitis present Compartment soft   Assessment/Plan: 2 Days Post-Op Procedure(s) (LRB): LEFT TOTAL KNEE ARTHROPLASTY (Left) Up with therapy Discharge home with home health      Mcarthur Rossetti 06/23/2020, 9:02 AM

## 2020-06-23 NOTE — Progress Notes (Signed)
Orthopedic Tech Progress Note Patient Details:  Melissa [name] Aug 18, 1951 [id number]  Ortho Devices Type of Ortho Device: CPM padding   Post Interventions Patient Tolerated: Well Instructions Provided: Care of device, Adjustment of device   [name] 06/23/2020, 12:18 PM

## 2020-06-24 ENCOUNTER — Telehealth: Payer: Self-pay | Admitting: Orthopaedic Surgery

## 2020-06-24 ENCOUNTER — Encounter (HOSPITAL_COMMUNITY): Payer: Self-pay | Admitting: Orthopaedic Surgery

## 2020-06-24 DIAGNOSIS — I10 Essential (primary) hypertension: Secondary | ICD-10-CM | POA: Diagnosis not present

## 2020-06-24 DIAGNOSIS — Z7982 Long term (current) use of aspirin: Secondary | ICD-10-CM | POA: Diagnosis not present

## 2020-06-24 DIAGNOSIS — M1990 Unspecified osteoarthritis, unspecified site: Secondary | ICD-10-CM | POA: Diagnosis not present

## 2020-06-24 DIAGNOSIS — Z87891 Personal history of nicotine dependence: Secondary | ICD-10-CM | POA: Diagnosis not present

## 2020-06-24 DIAGNOSIS — R69 Illness, unspecified: Secondary | ICD-10-CM | POA: Diagnosis not present

## 2020-06-24 DIAGNOSIS — E039 Hypothyroidism, unspecified: Secondary | ICD-10-CM | POA: Diagnosis not present

## 2020-06-24 DIAGNOSIS — Z96652 Presence of left artificial knee joint: Secondary | ICD-10-CM | POA: Diagnosis not present

## 2020-06-24 DIAGNOSIS — Z8701 Personal history of pneumonia (recurrent): Secondary | ICD-10-CM | POA: Diagnosis not present

## 2020-06-24 DIAGNOSIS — R7303 Prediabetes: Secondary | ICD-10-CM | POA: Diagnosis not present

## 2020-06-24 DIAGNOSIS — Z471 Aftercare following joint replacement surgery: Secondary | ICD-10-CM | POA: Diagnosis not present

## 2020-06-24 NOTE — Telephone Encounter (Signed)
Received call from Evelina Dun (PT) needing verbal orders for HHPT 3 Wk 1, 2 Wk 1 and 1 Wk 1. The number to contact Cindee Salt is [phone]

## 2020-06-24 NOTE — Telephone Encounter (Signed)
IC verbal given.  

## 2020-06-26 DIAGNOSIS — R69 Illness, unspecified: Secondary | ICD-10-CM | POA: Diagnosis not present

## 2020-06-26 DIAGNOSIS — Z471 Aftercare following joint replacement surgery: Secondary | ICD-10-CM | POA: Diagnosis not present

## 2020-06-26 DIAGNOSIS — M1990 Unspecified osteoarthritis, unspecified site: Secondary | ICD-10-CM | POA: Diagnosis not present

## 2020-06-26 DIAGNOSIS — R7303 Prediabetes: Secondary | ICD-10-CM | POA: Diagnosis not present

## 2020-06-26 DIAGNOSIS — Z7982 Long term (current) use of aspirin: Secondary | ICD-10-CM | POA: Diagnosis not present

## 2020-06-26 DIAGNOSIS — Z96652 Presence of left artificial knee joint: Secondary | ICD-10-CM | POA: Diagnosis not present

## 2020-06-26 DIAGNOSIS — Z8701 Personal history of pneumonia (recurrent): Secondary | ICD-10-CM | POA: Diagnosis not present

## 2020-06-26 DIAGNOSIS — Z87891 Personal history of nicotine dependence: Secondary | ICD-10-CM | POA: Diagnosis not present

## 2020-06-26 DIAGNOSIS — I10 Essential (primary) hypertension: Secondary | ICD-10-CM | POA: Diagnosis not present

## 2020-06-26 DIAGNOSIS — E039 Hypothyroidism, unspecified: Secondary | ICD-10-CM | POA: Diagnosis not present

## 2020-06-27 ENCOUNTER — Ambulatory Visit (INDEPENDENT_AMBULATORY_CARE_PROVIDER_SITE_OTHER): Payer: Medicare HMO | Admitting: Physician Assistant

## 2020-06-27 ENCOUNTER — Encounter: Payer: Self-pay | Admitting: Physician Assistant

## 2020-06-27 ENCOUNTER — Other Ambulatory Visit: Payer: Self-pay

## 2020-06-27 DIAGNOSIS — Z96652 Presence of left artificial knee joint: Secondary | ICD-10-CM

## 2020-06-27 NOTE — Progress Notes (Signed)
HPI: Melissa [name] returns today 6 days status post left total knee arthroplasty.  She states that this total knee is swelling more bruising more than her previous surgery on the right knee.  She has had no fevers or chills.  No shortness of breath no chest pain.  She does note that she has a significant amount of bruising down the leg.  She did stop her Dilaudid due to the fact that it was "making her loopy".  She is taking Advil for pain.   Physical exam: Left calf supple nontender.  She has significant bruising left thigh and down into the left calf region.  Surgical incisions well approximated with staples no signs of infection.  She has full extension knee flexion to 95 degrees actively.  Impression: Status post left total knee arthroplasty 06/21/2020.  Plan: Recommend she reapplied Aquacel dressing that she has at home whenever she returns to home.  A gauze dressing was applied today.  She will continue her aspirin 325 twice daily.  Continue work on range of motion strengthening.  Recommend compression hose and also elevation.  Should follow-up with [place] at her regularly scheduled appointment in 1 week.  Did discuss with her if she developed any significant calf pain or shortness of breath she needed to go to the emergency room.

## 2020-06-28 DIAGNOSIS — Z471 Aftercare following joint replacement surgery: Secondary | ICD-10-CM | POA: Diagnosis not present

## 2020-06-28 DIAGNOSIS — R7303 Prediabetes: Secondary | ICD-10-CM | POA: Diagnosis not present

## 2020-06-28 DIAGNOSIS — Z7982 Long term (current) use of aspirin: Secondary | ICD-10-CM | POA: Diagnosis not present

## 2020-06-28 DIAGNOSIS — Z8701 Personal history of pneumonia (recurrent): Secondary | ICD-10-CM | POA: Diagnosis not present

## 2020-06-28 DIAGNOSIS — Z96652 Presence of left artificial knee joint: Secondary | ICD-10-CM | POA: Diagnosis not present

## 2020-06-28 DIAGNOSIS — I10 Essential (primary) hypertension: Secondary | ICD-10-CM | POA: Diagnosis not present

## 2020-06-28 DIAGNOSIS — R69 Illness, unspecified: Secondary | ICD-10-CM | POA: Diagnosis not present

## 2020-06-28 DIAGNOSIS — Z87891 Personal history of nicotine dependence: Secondary | ICD-10-CM | POA: Diagnosis not present

## 2020-06-28 DIAGNOSIS — E039 Hypothyroidism, unspecified: Secondary | ICD-10-CM | POA: Diagnosis not present

## 2020-06-28 DIAGNOSIS — M1990 Unspecified osteoarthritis, unspecified site: Secondary | ICD-10-CM | POA: Diagnosis not present

## 2020-07-03 DIAGNOSIS — Z96652 Presence of left artificial knee joint: Secondary | ICD-10-CM | POA: Diagnosis not present

## 2020-07-03 DIAGNOSIS — I10 Essential (primary) hypertension: Secondary | ICD-10-CM | POA: Diagnosis not present

## 2020-07-03 DIAGNOSIS — Z471 Aftercare following joint replacement surgery: Secondary | ICD-10-CM | POA: Diagnosis not present

## 2020-07-03 DIAGNOSIS — Z87891 Personal history of nicotine dependence: Secondary | ICD-10-CM | POA: Diagnosis not present

## 2020-07-03 DIAGNOSIS — R69 Illness, unspecified: Secondary | ICD-10-CM | POA: Diagnosis not present

## 2020-07-03 DIAGNOSIS — E039 Hypothyroidism, unspecified: Secondary | ICD-10-CM | POA: Diagnosis not present

## 2020-07-03 DIAGNOSIS — Z7982 Long term (current) use of aspirin: Secondary | ICD-10-CM | POA: Diagnosis not present

## 2020-07-03 DIAGNOSIS — Z8701 Personal history of pneumonia (recurrent): Secondary | ICD-10-CM | POA: Diagnosis not present

## 2020-07-03 DIAGNOSIS — M1990 Unspecified osteoarthritis, unspecified site: Secondary | ICD-10-CM | POA: Diagnosis not present

## 2020-07-03 DIAGNOSIS — R7303 Prediabetes: Secondary | ICD-10-CM | POA: Diagnosis not present

## 2020-07-05 DIAGNOSIS — M1990 Unspecified osteoarthritis, unspecified site: Secondary | ICD-10-CM | POA: Diagnosis not present

## 2020-07-05 DIAGNOSIS — R7303 Prediabetes: Secondary | ICD-10-CM | POA: Diagnosis not present

## 2020-07-05 DIAGNOSIS — I10 Essential (primary) hypertension: Secondary | ICD-10-CM | POA: Diagnosis not present

## 2020-07-05 DIAGNOSIS — Z87891 Personal history of nicotine dependence: Secondary | ICD-10-CM | POA: Diagnosis not present

## 2020-07-05 DIAGNOSIS — R69 Illness, unspecified: Secondary | ICD-10-CM | POA: Diagnosis not present

## 2020-07-05 DIAGNOSIS — Z8701 Personal history of pneumonia (recurrent): Secondary | ICD-10-CM | POA: Diagnosis not present

## 2020-07-05 DIAGNOSIS — Z471 Aftercare following joint replacement surgery: Secondary | ICD-10-CM | POA: Diagnosis not present

## 2020-07-05 DIAGNOSIS — E039 Hypothyroidism, unspecified: Secondary | ICD-10-CM | POA: Diagnosis not present

## 2020-07-05 DIAGNOSIS — Z7982 Long term (current) use of aspirin: Secondary | ICD-10-CM | POA: Diagnosis not present

## 2020-07-05 DIAGNOSIS — Z96652 Presence of left artificial knee joint: Secondary | ICD-10-CM | POA: Diagnosis not present

## 2020-07-08 ENCOUNTER — Encounter: Payer: Self-pay | Admitting: Orthopaedic Surgery

## 2020-07-08 ENCOUNTER — Ambulatory Visit (INDEPENDENT_AMBULATORY_CARE_PROVIDER_SITE_OTHER): Payer: Medicare HMO | Admitting: Orthopaedic Surgery

## 2020-07-08 DIAGNOSIS — Z96652 Presence of left artificial knee joint: Secondary | ICD-10-CM

## 2020-07-08 NOTE — Progress Notes (Signed)
The patient is 2 weeks status post a left total knee arthroplasty and doing well overall.  We replaced her right knee earlier this year.  She is already off the narcotics.  The notes from therapy said they have been able to passively flex her already to 105 degrees on the left knee.  She is hoping to continue outpatient physical therapy at [facility] outpatient office in [place].  I do feel is appropriate for them to work on both her knees.  She has been on twice a day 325 mg aspirin.  She can go back to just a baby aspirin daily.  Examination of her left knee shows the staples have been removed and Steri-Strips are in place.  Her knee moves actually pretty good considering she is only just coming up on 2 weeks postoperative.  Feels ligamentously stable as well.  Her calf is soft.  We will set her up for outpatient physical therapy to work on anything they can do to strengthen both knees and get moving.  She can drop from my standpoint.  I would like to see her back in 4 weeks for repeat exam but no x-rays are needed.  All questions and concerns were answered addressed.  She is actually going to return to work starting next week.

## 2020-07-09 ENCOUNTER — Other Ambulatory Visit: Payer: Self-pay

## 2020-07-09 DIAGNOSIS — E039 Hypothyroidism, unspecified: Secondary | ICD-10-CM | POA: Diagnosis not present

## 2020-07-09 DIAGNOSIS — Z471 Aftercare following joint replacement surgery: Secondary | ICD-10-CM | POA: Diagnosis not present

## 2020-07-09 DIAGNOSIS — I10 Essential (primary) hypertension: Secondary | ICD-10-CM | POA: Diagnosis not present

## 2020-07-09 DIAGNOSIS — Z7982 Long term (current) use of aspirin: Secondary | ICD-10-CM | POA: Diagnosis not present

## 2020-07-09 DIAGNOSIS — R7303 Prediabetes: Secondary | ICD-10-CM | POA: Diagnosis not present

## 2020-07-09 DIAGNOSIS — Z87891 Personal history of nicotine dependence: Secondary | ICD-10-CM | POA: Diagnosis not present

## 2020-07-09 DIAGNOSIS — Z96651 Presence of right artificial knee joint: Secondary | ICD-10-CM

## 2020-07-09 DIAGNOSIS — Z96652 Presence of left artificial knee joint: Secondary | ICD-10-CM | POA: Diagnosis not present

## 2020-07-09 DIAGNOSIS — R69 Illness, unspecified: Secondary | ICD-10-CM | POA: Diagnosis not present

## 2020-07-09 DIAGNOSIS — M1990 Unspecified osteoarthritis, unspecified site: Secondary | ICD-10-CM | POA: Diagnosis not present

## 2020-07-09 DIAGNOSIS — Z8701 Personal history of pneumonia (recurrent): Secondary | ICD-10-CM | POA: Diagnosis not present

## 2020-07-10 DIAGNOSIS — Z20822 Contact with and (suspected) exposure to covid-19: Secondary | ICD-10-CM | POA: Diagnosis not present

## 2020-07-21 DIAGNOSIS — I10 Essential (primary) hypertension: Secondary | ICD-10-CM | POA: Diagnosis not present

## 2020-07-21 DIAGNOSIS — E039 Hypothyroidism, unspecified: Secondary | ICD-10-CM | POA: Diagnosis not present

## 2020-07-21 DIAGNOSIS — M170 Bilateral primary osteoarthritis of knee: Secondary | ICD-10-CM | POA: Diagnosis not present

## 2020-07-21 DIAGNOSIS — E785 Hyperlipidemia, unspecified: Secondary | ICD-10-CM | POA: Diagnosis not present

## 2020-07-25 DIAGNOSIS — Z03818 Encounter for observation for suspected exposure to other biological agents ruled out: Secondary | ICD-10-CM | POA: Diagnosis not present

## 2020-08-02 DIAGNOSIS — Z03818 Encounter for observation for suspected exposure to other biological agents ruled out: Secondary | ICD-10-CM | POA: Diagnosis not present

## 2020-08-06 ENCOUNTER — Ambulatory Visit: Payer: Medicare HMO | Admitting: Orthopaedic Surgery

## 2020-08-13 DIAGNOSIS — Z03818 Encounter for observation for suspected exposure to other biological agents ruled out: Secondary | ICD-10-CM | POA: Diagnosis not present

## 2020-08-14 ENCOUNTER — Ambulatory Visit (INDEPENDENT_AMBULATORY_CARE_PROVIDER_SITE_OTHER): Payer: Medicare HMO | Admitting: Orthopaedic Surgery

## 2020-08-14 ENCOUNTER — Encounter: Payer: Self-pay | Admitting: Orthopaedic Surgery

## 2020-08-14 DIAGNOSIS — Z96651 Presence of right artificial knee joint: Secondary | ICD-10-CM

## 2020-08-14 DIAGNOSIS — Z96652 Presence of left artificial knee joint: Secondary | ICD-10-CM

## 2020-08-14 MED ORDER — AMOXICILLIN 500 MG PO TABS
500.0000 mg | ORAL_TABLET | Freq: Two times a day (BID) | ORAL | 0 refills | Status: AC
Start: 2020-08-14 — End: 2020-08-16

## 2020-08-14 NOTE — Progress Notes (Signed)
The patient is now 6 weeks status post a left total knee arthroplasty and 5 months status post a right total knee arthroplasty. She is doing very well overall and working on range of motion and strength. She has not been to outpatient therapy yet and has been doing a home exercise program on her own. Apparently they were too busy at [facility] to get her in until November.  Examination of both knees show surprisingly excellent range of motion with full extension and excellent flexion.  I gave her reassurance that she is doing well. She would still like outpatient physical therapy so I think we should set this up for here at [facility] for outpatient therapy to work on strengthening both knees as well as her balance and coordination. I will see her back in 6 weeks to see how she is doing overall. No x-rays will be needed at that visit.

## 2020-08-14 NOTE — Addendum Note (Signed)
Addended by: Meyer Cory on: 08/14/2020 04:38 PM   Modules accepted: Orders

## 2020-08-16 ENCOUNTER — Ambulatory Visit: Payer: Medicare HMO | Admitting: Rehabilitative and Restorative Service Providers"

## 2020-08-17 DIAGNOSIS — R69 Illness, unspecified: Secondary | ICD-10-CM | POA: Diagnosis not present

## 2020-08-20 DIAGNOSIS — Z03818 Encounter for observation for suspected exposure to other biological agents ruled out: Secondary | ICD-10-CM | POA: Diagnosis not present

## 2020-08-26 ENCOUNTER — Ambulatory Visit (INDEPENDENT_AMBULATORY_CARE_PROVIDER_SITE_OTHER): Payer: Medicare HMO | Admitting: Physical Therapy

## 2020-08-26 ENCOUNTER — Other Ambulatory Visit: Payer: Self-pay

## 2020-08-26 ENCOUNTER — Encounter: Payer: Self-pay | Admitting: Physical Therapy

## 2020-08-26 DIAGNOSIS — G8929 Other chronic pain: Secondary | ICD-10-CM | POA: Diagnosis not present

## 2020-08-26 DIAGNOSIS — M25561 Pain in right knee: Secondary | ICD-10-CM | POA: Diagnosis not present

## 2020-08-26 DIAGNOSIS — M25562 Pain in left knee: Secondary | ICD-10-CM

## 2020-08-26 NOTE — Therapy (Signed)
[facility] [address] [place], [place], [address] Phone: [phone]   Fax:  [phone]  Physical Therapy Evaluation  Patient Details  Name: Melissa [name] MRN: [id number] Date of Birth: November 23, 1951 Referring Provider (PT): Jean Rosenthal, MD   Encounter Date: 08/26/2020   PT End of Session - 08/26/20 1431    Visit Number 1    Authorization Type [employer] Medicare    PT Start Time 3329    PT Stop Time 1425    PT Time Calculation (min) 37 min           Past Medical History:  Diagnosis Date  . Arthritis    osteo arthritis  . Hypertension   . Hypothyroidism   . Pneumonia   . Pre-diabetes    Takes metformin  . Walking pneumonia     Past Surgical History:  Procedure Laterality Date  . JOINT REPLACEMENT    . MENISCUS REPAIR     right knee  . MULTIPLE TOOTH EXTRACTIONS     with implants  . ovaries removed     2003  . TONSILLECTOMY    . TOTAL KNEE ARTHROPLASTY Right 03/08/2020   Procedure: RIGHT TOTAL KNEE ARTHROPLASTY AND LEFT KNEE STEROID INJECTION;  Surgeon: Mcarthur Rossetti, MD;  Location: [facility];  Service: Orthopedics;  Laterality: Right;  . TOTAL KNEE ARTHROPLASTY Left 06/21/2020   Procedure: LEFT TOTAL KNEE ARTHROPLASTY;  Surgeon: Mcarthur Rossetti, MD;  Location: [facility];  Service: Orthopedics;  Laterality: Left;    There were no vitals filed for this visit.    Subjective Assessment - 08/26/20 1351    Subjective She underwent right Total Knee Arthroplasty on 03/08/2020 and was able to get around well with that knee. Then she underwent left Total Knee Arthroplasy on 06/21/2020.    Pertinent History HTN, hypothyroidism    Patient Stated Goals decrease soreness, exercise    Currently in Pain? Yes    Pain Location Knee    Pain Orientation Right;Left;Anterior    Pain Descriptors / Indicators Sore;Tightness    Pain Type Surgical pain    Pain Onset More than a month ago    Pain Frequency Intermittent     Aggravating Factors  sit or stand too long    Pain Relieving Factors put feet up,              [facility] PT Assessment - 08/26/20 1248      Assessment   Medical Diagnosis Bilateral Knee Arthroplasty    Referring Provider (PT) Jean Rosenthal, MD    Onset Date/Surgical Date 06/21/20    Prior Therapy [facility]PT discharged 2 weeks after surgery      Precautions   Precautions Fall      Balance Screen   Has the patient fallen in the past 6 months No    Has the patient had a decrease in activity level because of a fear of falling?  No    Is the patient reluctant to leave their home because of a fear of falling?  No      Home Environment   Living Environment Private residence    Living Arrangements Alone    Type of Home Other(Comment)   townhome   Home Access Stairs to enter    Entrance Stairs-Number of Steps 3    Entrance Stairs-Rails None    Home Layout Two level;1/2 bath on main level    Alternate Level Stairs-Number of Steps 14    Alternate Level Stairs-Rails Right  partially also on left     Prior Function   Level of Independence Independent;Independent with household mobility without device;Independent with community mobility without device    Vocation Full time employment    Magazine features editor at [employer] - walk across campus, work at [employer], walking,       ROM / Strength   AROM / PROM / Strength AROM;PROM;Strength      AROM   Overall AROM  Within functional limits for tasks performed    Right Knee Extension 0   sitting   Right Knee Flexion 114   prone   Left Knee Extension 0   sitting   Left Knee Flexion 112   prone     PROM   Overall PROM  Within functional limits for tasks performed    Right Knee Extension 0    Right Knee Flexion 126   seated   Left Knee Extension 0    Left Knee Flexion 121   seated     Strength   Overall Strength Within functional limits for tasks performed    Strength Assessment Site Hip;Knee;Ankle     Right Hip Flexion 5/5    Right Hip Extension 5/5    Right Hip ABduction 5/5    Left Hip Flexion 5/5    Left Hip Extension 5/5    Left Hip ABduction 5/5    Right/Left Knee Right;Left    Right Knee Flexion 5/5    Right Knee Extension 5/5    Left Knee Flexion 5/5    Left Knee Extension 5/5    Right Ankle Dorsiflexion 5/5    Left Ankle Dorsiflexion 5/5      Ambulation/Gait   Ambulation/Gait Yes    Ambulation/Gait Assistance 7: Independent    Assistive device None    Gait Pattern Within Functional Limits    Ambulation Surface Level;Indoor                      Objective measurements completed on examination: See above findings.       [facility] PT Treatment/Exercise - 08/26/20 1248      Therapeutic Activites    Therapeutic Activities Other Therapeutic Activities    Other Therapeutic Activities pt request: PT demo kneeling using BUEs on horizontal surface. Pt return demo understanding.  PT recommended kneeling on pillow or soft surface initially.                   PT Education - 08/26/20 1429    Education Details Well rounded fitness program includes endurance, flexibility, strength & balance components.  Recommend using [employer] or gym with her Silver Sneakers 2-3 days/wk and home program / exercises 3-5 days/wk.  build overall endurace to returning to walking with combo walk & stationary bike.    Person(s) Educated Patient    Methods Explanation;Verbal cues    Comprehension Verbalized understanding                       Plan - 08/26/20 1434    Clinical Impression Statement This patient has full AROM & PROM of bilateral knees.  She has normal strength in BLEs.  She reports soreness & tightness if she sits or stands too long.  She is independent with HEP.  PT educated on well rounded fitness program with recommendation to add [employer] 2-3 days/wk to program.  PT also educated on kneeling per patient request and she appears to  understand.  Patient does  not appear to need PT at this time.    Stability/Clinical Decision Making Stable/Uncomplicated    PT Frequency One time visit    PT Next Visit Plan evaluation only    Consulted and Agree with Plan of Care Patient           Patient will benefit from skilled therapeutic intervention in order to improve the following deficits and impairments:     Visit Diagnosis: Chronic pain of left knee  Chronic pain of right knee     Problem List Patient Active Problem List   Diagnosis Date Noted  . Status post total left knee replacement 06/21/2020  . Status post total right knee replacement 03/08/2020  . Pain in right foot 07/05/2017  . Chronic pain of right knee 07/05/2017  . Chronic pain of left knee 07/05/2017  . Unilateral primary osteoarthritis, left knee 07/05/2017  . Unilateral primary osteoarthritis, right knee 07/05/2017  . 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, right 04/14/2017    Jamey Reas PT, DPT 08/26/2020, 2:37 PM  [facility] [address] [place], [place], [address] Phone: [phone]   Fax:  [phone]  Name: Melissa [name] MRN: [id number] Date of Birth: Nov 22, 1951

## 2020-08-29 ENCOUNTER — Telehealth: Payer: Self-pay

## 2020-08-29 NOTE — Telephone Encounter (Signed)
Melissa [name] from dr [facility] called in saying Patient came in today for dental appt , says she took half of her pre med before her appointment , and told them she was taking second after appoitnment  Wanted to know what is dr Ninfa Linden orders as far as her taking pre med . Says you can fax instructions   [phone] fax#

## 2020-08-30 NOTE — Telephone Encounter (Signed)
Faxed to provided number  

## 2020-09-03 DIAGNOSIS — Z03818 Encounter for observation for suspected exposure to other biological agents ruled out: Secondary | ICD-10-CM | POA: Diagnosis not present

## 2020-09-10 DIAGNOSIS — Z03818 Encounter for observation for suspected exposure to other biological agents ruled out: Secondary | ICD-10-CM | POA: Diagnosis not present

## 2020-09-11 ENCOUNTER — Other Ambulatory Visit: Payer: Self-pay | Admitting: Family Medicine

## 2020-09-11 DIAGNOSIS — Z1231 Encounter for screening mammogram for malignant neoplasm of breast: Secondary | ICD-10-CM

## 2020-09-18 DIAGNOSIS — Z03818 Encounter for observation for suspected exposure to other biological agents ruled out: Secondary | ICD-10-CM | POA: Diagnosis not present

## 2020-09-24 DIAGNOSIS — Z03818 Encounter for observation for suspected exposure to other biological agents ruled out: Secondary | ICD-10-CM | POA: Diagnosis not present

## 2020-09-25 ENCOUNTER — Ambulatory Visit: Payer: Medicare HMO | Admitting: Orthopaedic Surgery

## 2020-09-25 ENCOUNTER — Encounter: Payer: Self-pay | Admitting: Orthopaedic Surgery

## 2020-09-25 DIAGNOSIS — Z96651 Presence of right artificial knee joint: Secondary | ICD-10-CM

## 2020-09-25 DIAGNOSIS — Z96652 Presence of left artificial knee joint: Secondary | ICD-10-CM

## 2020-09-25 NOTE — Progress Notes (Signed)
The patient is coming in for follow-up at 45-month status post a left total knee arthroplasty and about 31-month status post a right total knee arthroplasty.  She says she is doing well and walking without assistive device.  She is working on her exercise program and has been released from therapy.  On examination both knees have full extension and full flexion.  Both knees feel stable.  There is minimal swelling.  She will continue to increase her activities as comfort allows.  She will continue to work on quad strengthening exercises.  I do need to see her back in 6 months for a standing AP and lateral both knees.  If there is any issues before then she will let us know.

## 2020-10-01 DIAGNOSIS — Z03818 Encounter for observation for suspected exposure to other biological agents ruled out: Secondary | ICD-10-CM | POA: Diagnosis not present

## 2020-10-08 DIAGNOSIS — Z03818 Encounter for observation for suspected exposure to other biological agents ruled out: Secondary | ICD-10-CM | POA: Diagnosis not present

## 2020-10-09 ENCOUNTER — Ambulatory Visit
Admission: RE | Admit: 2020-10-09 | Discharge: 2020-10-09 | Disposition: A | Discharge status: Home / Self Care | Payer: Medicare HMO | Source: Ambulatory Visit | Attending: Family Medicine | Admitting: Family Medicine

## 2020-10-09 ENCOUNTER — Other Ambulatory Visit: Payer: Self-pay

## 2020-10-09 DIAGNOSIS — Z1231 Encounter for screening mammogram for malignant neoplasm of breast: Secondary | ICD-10-CM

## 2020-10-11 DIAGNOSIS — Z20822 Contact with and (suspected) exposure to covid-19: Secondary | ICD-10-CM | POA: Diagnosis not present

## 2020-10-16 DIAGNOSIS — E039 Hypothyroidism, unspecified: Secondary | ICD-10-CM | POA: Diagnosis not present

## 2020-10-16 DIAGNOSIS — I10 Essential (primary) hypertension: Secondary | ICD-10-CM | POA: Diagnosis not present

## 2020-10-16 DIAGNOSIS — E785 Hyperlipidemia, unspecified: Secondary | ICD-10-CM | POA: Diagnosis not present

## 2020-10-16 DIAGNOSIS — M170 Bilateral primary osteoarthritis of knee: Secondary | ICD-10-CM | POA: Diagnosis not present

## 2020-10-29 DIAGNOSIS — Z03818 Encounter for observation for suspected exposure to other biological agents ruled out: Secondary | ICD-10-CM | POA: Diagnosis not present

## 2020-11-04 DIAGNOSIS — I10 Essential (primary) hypertension: Secondary | ICD-10-CM | POA: Diagnosis not present

## 2020-11-04 DIAGNOSIS — E039 Hypothyroidism, unspecified: Secondary | ICD-10-CM | POA: Diagnosis not present

## 2020-11-04 DIAGNOSIS — R69 Illness, unspecified: Secondary | ICD-10-CM | POA: Diagnosis not present

## 2020-11-04 DIAGNOSIS — E559 Vitamin D deficiency, unspecified: Secondary | ICD-10-CM | POA: Diagnosis not present

## 2020-11-04 DIAGNOSIS — E785 Hyperlipidemia, unspecified: Secondary | ICD-10-CM | POA: Diagnosis not present

## 2020-11-04 DIAGNOSIS — R7303 Prediabetes: Secondary | ICD-10-CM | POA: Diagnosis not present

## 2020-11-05 DIAGNOSIS — Z03818 Encounter for observation for suspected exposure to other biological agents ruled out: Secondary | ICD-10-CM | POA: Diagnosis not present

## 2020-11-28 DIAGNOSIS — E119 Type 2 diabetes mellitus without complications: Secondary | ICD-10-CM | POA: Diagnosis not present

## 2020-11-28 DIAGNOSIS — H5213 Myopia, bilateral: Secondary | ICD-10-CM | POA: Diagnosis not present

## 2020-11-28 DIAGNOSIS — H524 Presbyopia: Secondary | ICD-10-CM | POA: Diagnosis not present

## 2020-12-31 DIAGNOSIS — Z03818 Encounter for observation for suspected exposure to other biological agents ruled out: Secondary | ICD-10-CM | POA: Diagnosis not present

## 2021-01-16 DIAGNOSIS — M170 Bilateral primary osteoarthritis of knee: Secondary | ICD-10-CM | POA: Diagnosis not present

## 2021-01-16 DIAGNOSIS — I10 Essential (primary) hypertension: Secondary | ICD-10-CM | POA: Diagnosis not present

## 2021-01-16 DIAGNOSIS — E039 Hypothyroidism, unspecified: Secondary | ICD-10-CM | POA: Diagnosis not present

## 2021-01-16 DIAGNOSIS — E785 Hyperlipidemia, unspecified: Secondary | ICD-10-CM | POA: Diagnosis not present

## 2021-02-03 DIAGNOSIS — Z20822 Contact with and (suspected) exposure to covid-19: Secondary | ICD-10-CM | POA: Diagnosis not present

## 2021-02-03 DIAGNOSIS — Z03818 Encounter for observation for suspected exposure to other biological agents ruled out: Secondary | ICD-10-CM | POA: Diagnosis not present

## 2021-03-03 DIAGNOSIS — Z03818 Encounter for observation for suspected exposure to other biological agents ruled out: Secondary | ICD-10-CM | POA: Diagnosis not present

## 2021-03-17 DIAGNOSIS — M5451 Vertebrogenic low back pain: Secondary | ICD-10-CM | POA: Diagnosis not present

## 2021-03-17 DIAGNOSIS — M4126 Other idiopathic scoliosis, lumbar region: Secondary | ICD-10-CM | POA: Diagnosis not present

## 2021-03-17 DIAGNOSIS — M5137 Other intervertebral disc degeneration, lumbosacral region: Secondary | ICD-10-CM | POA: Diagnosis not present

## 2021-03-17 DIAGNOSIS — M5136 Other intervertebral disc degeneration, lumbar region: Secondary | ICD-10-CM | POA: Diagnosis not present

## 2021-03-17 DIAGNOSIS — M9903 Segmental and somatic dysfunction of lumbar region: Secondary | ICD-10-CM | POA: Diagnosis not present

## 2021-03-18 DIAGNOSIS — M5136 Other intervertebral disc degeneration, lumbar region: Secondary | ICD-10-CM | POA: Diagnosis not present

## 2021-03-18 DIAGNOSIS — M4126 Other idiopathic scoliosis, lumbar region: Secondary | ICD-10-CM | POA: Diagnosis not present

## 2021-03-18 DIAGNOSIS — M5451 Vertebrogenic low back pain: Secondary | ICD-10-CM | POA: Diagnosis not present

## 2021-03-18 DIAGNOSIS — M9903 Segmental and somatic dysfunction of lumbar region: Secondary | ICD-10-CM | POA: Diagnosis not present

## 2021-03-18 DIAGNOSIS — M5137 Other intervertebral disc degeneration, lumbosacral region: Secondary | ICD-10-CM | POA: Diagnosis not present

## 2021-03-19 DIAGNOSIS — M5136 Other intervertebral disc degeneration, lumbar region: Secondary | ICD-10-CM | POA: Diagnosis not present

## 2021-03-19 DIAGNOSIS — M4126 Other idiopathic scoliosis, lumbar region: Secondary | ICD-10-CM | POA: Diagnosis not present

## 2021-03-19 DIAGNOSIS — Z23 Encounter for immunization: Secondary | ICD-10-CM | POA: Diagnosis not present

## 2021-03-19 DIAGNOSIS — M9903 Segmental and somatic dysfunction of lumbar region: Secondary | ICD-10-CM | POA: Diagnosis not present

## 2021-03-19 DIAGNOSIS — M5137 Other intervertebral disc degeneration, lumbosacral region: Secondary | ICD-10-CM | POA: Diagnosis not present

## 2021-03-19 DIAGNOSIS — M5451 Vertebrogenic low back pain: Secondary | ICD-10-CM | POA: Diagnosis not present

## 2021-03-20 DIAGNOSIS — M5451 Vertebrogenic low back pain: Secondary | ICD-10-CM | POA: Diagnosis not present

## 2021-03-20 DIAGNOSIS — M5137 Other intervertebral disc degeneration, lumbosacral region: Secondary | ICD-10-CM | POA: Diagnosis not present

## 2021-03-20 DIAGNOSIS — M4126 Other idiopathic scoliosis, lumbar region: Secondary | ICD-10-CM | POA: Diagnosis not present

## 2021-03-20 DIAGNOSIS — M9903 Segmental and somatic dysfunction of lumbar region: Secondary | ICD-10-CM | POA: Diagnosis not present

## 2021-03-20 DIAGNOSIS — M5136 Other intervertebral disc degeneration, lumbar region: Secondary | ICD-10-CM | POA: Diagnosis not present

## 2021-03-25 ENCOUNTER — Ambulatory Visit (INDEPENDENT_AMBULATORY_CARE_PROVIDER_SITE_OTHER): Payer: Medicare HMO

## 2021-03-25 ENCOUNTER — Encounter: Payer: Self-pay | Admitting: Orthopaedic Surgery

## 2021-03-25 ENCOUNTER — Ambulatory Visit: Payer: Self-pay

## 2021-03-25 ENCOUNTER — Ambulatory Visit (INDEPENDENT_AMBULATORY_CARE_PROVIDER_SITE_OTHER): Payer: Medicare HMO | Admitting: Orthopaedic Surgery

## 2021-03-25 DIAGNOSIS — Z96652 Presence of left artificial knee joint: Secondary | ICD-10-CM

## 2021-03-25 DIAGNOSIS — M9903 Segmental and somatic dysfunction of lumbar region: Secondary | ICD-10-CM | POA: Diagnosis not present

## 2021-03-25 DIAGNOSIS — M5136 Other intervertebral disc degeneration, lumbar region: Secondary | ICD-10-CM | POA: Diagnosis not present

## 2021-03-25 DIAGNOSIS — M5451 Vertebrogenic low back pain: Secondary | ICD-10-CM | POA: Diagnosis not present

## 2021-03-25 DIAGNOSIS — M4126 Other idiopathic scoliosis, lumbar region: Secondary | ICD-10-CM | POA: Diagnosis not present

## 2021-03-25 DIAGNOSIS — Z96651 Presence of right artificial knee joint: Secondary | ICD-10-CM | POA: Diagnosis not present

## 2021-03-25 DIAGNOSIS — Z96653 Presence of artificial knee joint, bilateral: Secondary | ICD-10-CM

## 2021-03-25 DIAGNOSIS — M5137 Other intervertebral disc degeneration, lumbosacral region: Secondary | ICD-10-CM | POA: Diagnosis not present

## 2021-03-25 NOTE — Progress Notes (Signed)
Office Visit Note   Patient: Melissa [name]           Date of Birth: [birth year]           MRN: [id number] Visit Date: 03/25/2021              Requested by: Marda Stalker, PA-C [place],  [place] [address] PCP: Marda Stalker, PA-C   Assessment & Plan: Visit Diagnoses:  1. History of bilateral knee replacement   2. Status post total left knee replacement   3. Status post total right knee replacement     Plan: Discussed with her that the numbness tingling in her knees will go away with time but it will be probably some years which completely go away.  Regards to her knees she has excellent range of motion.  She only needs to work on endurance and strength.  She can follow-up with [place] as needed.  Questions encouraged and answered by Dr. Ninfa Linden and myself.  Follow-Up Instructions: Return if symptoms worsen or fail to improve.   Orders:  Orders Placed This Encounter  Procedures  . XR Knee 1-2 Views Left  . XR Knee 1-2 Views Right   No orders of the defined types were placed in this encounter.     Procedures: No procedures performed   Clinical Data: No additional findings.   Subjective: Chief Complaint  Patient presents with  . Left Knee - Follow-up  . Right Knee - Follow-up    HPI Melissa [name] returns today status post left total knee arthroplasty 06/21/2020 and right total knee 03/08/2020.  She states both knees are doing well.  She still has some numbness tingling in both knees to light touch.  She states she is getting back to walking.  She also notes that she is having some low back pain and seeing a chiropractor for this.  No radicular symptoms down either leg.  Review of Systems See HPI otherwise negative  Objective: Vital Signs: There were no vitals taken for this visit.  Physical Exam Constitutional:      Appearance: She is not ill-appearing or diaphoretic.  Neurological:     Mental Status: She is alert.  Psychiatric:         Mood and Affect: Mood normal.     Ortho Exam Bilateral knees: Full range of motion of both knees actually hyperextends slightly bilaterally.  Surgical incisions are healing well no signs of infection.  No instability valgus varus stressing of either knee.  Specialty Comments:  No specialty comments available.  Imaging: No results found.   PMFS History: Patient Active Problem List   Diagnosis Date Noted  . Status post total left knee replacement 06/21/2020  . Status post total right knee replacement 03/08/2020  . Pain in right foot 07/05/2017  . Chronic pain of right knee 07/05/2017  . Chronic pain of left knee 07/05/2017  . Unilateral primary osteoarthritis, left knee 07/05/2017  . Unilateral primary osteoarthritis, right knee 07/05/2017  . 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 Arthritis    osteo arthritis  . Hypertension   . Hypothyroidism   . Pneumonia   . Pre-diabetes    Takes metformin  . Walking pneumonia     Family History  Problem Relation Age of Onset  . Breast cancer Maternal Aunt        in 80's    Past Surgical History:  Procedure Laterality Date  . JOINT REPLACEMENT    . MENISCUS REPAIR     right knee  . MULTIPLE TOOTH EXTRACTIONS     with implants  . ovaries removed     2003  . TONSILLECTOMY    . TOTAL KNEE ARTHROPLASTY Right 03/08/2020   Procedure: RIGHT TOTAL KNEE ARTHROPLASTY AND LEFT KNEE STEROID INJECTION;  Surgeon: Mcarthur Rossetti, MD;  Location: [facility];  Service: Orthopedics;  Laterality: Right;  . TOTAL KNEE ARTHROPLASTY Left 06/21/2020   Procedure: LEFT TOTAL KNEE ARTHROPLASTY;  Surgeon: Mcarthur Rossetti, MD;  Location: [facility];  Service: Orthopedics;  Laterality: Left;   Social History   Occupational History    Employer: [employer]  Tobacco Use  . Smoking status: Former Smoker    Years: 10.00  . Smokeless tobacco: Never Used  .  Tobacco comment: quit when 70 years old  Vaping Use  . Vaping Use: Never used  Substance and Sexual Activity  . Alcohol use: Yes    Alcohol/week: 2.0 standard drinks    Types: 1 Glasses of wine, 1 Shots of liquor per week    Comment: daily  . Drug use: Never  . Sexual activity: Not Currently    Birth control/protection: None

## 2021-03-26 DIAGNOSIS — M9903 Segmental and somatic dysfunction of lumbar region: Secondary | ICD-10-CM | POA: Diagnosis not present

## 2021-03-26 DIAGNOSIS — M4126 Other idiopathic scoliosis, lumbar region: Secondary | ICD-10-CM | POA: Diagnosis not present

## 2021-03-26 DIAGNOSIS — M5451 Vertebrogenic low back pain: Secondary | ICD-10-CM | POA: Diagnosis not present

## 2021-03-26 DIAGNOSIS — M5137 Other intervertebral disc degeneration, lumbosacral region: Secondary | ICD-10-CM | POA: Diagnosis not present

## 2021-03-26 DIAGNOSIS — M5136 Other intervertebral disc degeneration, lumbar region: Secondary | ICD-10-CM | POA: Diagnosis not present

## 2021-03-27 DIAGNOSIS — M5451 Vertebrogenic low back pain: Secondary | ICD-10-CM | POA: Diagnosis not present

## 2021-03-27 DIAGNOSIS — M4126 Other idiopathic scoliosis, lumbar region: Secondary | ICD-10-CM | POA: Diagnosis not present

## 2021-03-27 DIAGNOSIS — M5137 Other intervertebral disc degeneration, lumbosacral region: Secondary | ICD-10-CM | POA: Diagnosis not present

## 2021-03-27 DIAGNOSIS — M9903 Segmental and somatic dysfunction of lumbar region: Secondary | ICD-10-CM | POA: Diagnosis not present

## 2021-03-27 DIAGNOSIS — M5136 Other intervertebral disc degeneration, lumbar region: Secondary | ICD-10-CM | POA: Diagnosis not present

## 2021-04-01 DIAGNOSIS — M5137 Other intervertebral disc degeneration, lumbosacral region: Secondary | ICD-10-CM | POA: Diagnosis not present

## 2021-04-01 DIAGNOSIS — M4126 Other idiopathic scoliosis, lumbar region: Secondary | ICD-10-CM | POA: Diagnosis not present

## 2021-04-01 DIAGNOSIS — M5136 Other intervertebral disc degeneration, lumbar region: Secondary | ICD-10-CM | POA: Diagnosis not present

## 2021-04-01 DIAGNOSIS — M9903 Segmental and somatic dysfunction of lumbar region: Secondary | ICD-10-CM | POA: Diagnosis not present

## 2021-04-01 DIAGNOSIS — M5451 Vertebrogenic low back pain: Secondary | ICD-10-CM | POA: Diagnosis not present

## 2021-04-02 DIAGNOSIS — M9903 Segmental and somatic dysfunction of lumbar region: Secondary | ICD-10-CM | POA: Diagnosis not present

## 2021-04-02 DIAGNOSIS — M4126 Other idiopathic scoliosis, lumbar region: Secondary | ICD-10-CM | POA: Diagnosis not present

## 2021-04-02 DIAGNOSIS — M5137 Other intervertebral disc degeneration, lumbosacral region: Secondary | ICD-10-CM | POA: Diagnosis not present

## 2021-04-02 DIAGNOSIS — M5451 Vertebrogenic low back pain: Secondary | ICD-10-CM | POA: Diagnosis not present

## 2021-04-02 DIAGNOSIS — M5136 Other intervertebral disc degeneration, lumbar region: Secondary | ICD-10-CM | POA: Diagnosis not present

## 2021-04-03 DIAGNOSIS — M5136 Other intervertebral disc degeneration, lumbar region: Secondary | ICD-10-CM | POA: Diagnosis not present

## 2021-04-03 DIAGNOSIS — M5137 Other intervertebral disc degeneration, lumbosacral region: Secondary | ICD-10-CM | POA: Diagnosis not present

## 2021-04-03 DIAGNOSIS — M5451 Vertebrogenic low back pain: Secondary | ICD-10-CM | POA: Diagnosis not present

## 2021-04-03 DIAGNOSIS — M9903 Segmental and somatic dysfunction of lumbar region: Secondary | ICD-10-CM | POA: Diagnosis not present

## 2021-04-03 DIAGNOSIS — M4126 Other idiopathic scoliosis, lumbar region: Secondary | ICD-10-CM | POA: Diagnosis not present

## 2021-04-07 DIAGNOSIS — M5451 Vertebrogenic low back pain: Secondary | ICD-10-CM | POA: Diagnosis not present

## 2021-04-07 DIAGNOSIS — M9903 Segmental and somatic dysfunction of lumbar region: Secondary | ICD-10-CM | POA: Diagnosis not present

## 2021-04-07 DIAGNOSIS — M5137 Other intervertebral disc degeneration, lumbosacral region: Secondary | ICD-10-CM | POA: Diagnosis not present

## 2021-04-07 DIAGNOSIS — M4126 Other idiopathic scoliosis, lumbar region: Secondary | ICD-10-CM | POA: Diagnosis not present

## 2021-04-07 DIAGNOSIS — M5136 Other intervertebral disc degeneration, lumbar region: Secondary | ICD-10-CM | POA: Diagnosis not present

## 2021-04-08 DIAGNOSIS — M5451 Vertebrogenic low back pain: Secondary | ICD-10-CM | POA: Diagnosis not present

## 2021-04-08 DIAGNOSIS — M9903 Segmental and somatic dysfunction of lumbar region: Secondary | ICD-10-CM | POA: Diagnosis not present

## 2021-04-08 DIAGNOSIS — M5136 Other intervertebral disc degeneration, lumbar region: Secondary | ICD-10-CM | POA: Diagnosis not present

## 2021-04-08 DIAGNOSIS — M4126 Other idiopathic scoliosis, lumbar region: Secondary | ICD-10-CM | POA: Diagnosis not present

## 2021-04-08 DIAGNOSIS — M5137 Other intervertebral disc degeneration, lumbosacral region: Secondary | ICD-10-CM | POA: Diagnosis not present

## 2021-04-10 DIAGNOSIS — M4126 Other idiopathic scoliosis, lumbar region: Secondary | ICD-10-CM | POA: Diagnosis not present

## 2021-04-10 DIAGNOSIS — M5137 Other intervertebral disc degeneration, lumbosacral region: Secondary | ICD-10-CM | POA: Diagnosis not present

## 2021-04-10 DIAGNOSIS — M5136 Other intervertebral disc degeneration, lumbar region: Secondary | ICD-10-CM | POA: Diagnosis not present

## 2021-04-10 DIAGNOSIS — M5451 Vertebrogenic low back pain: Secondary | ICD-10-CM | POA: Diagnosis not present

## 2021-04-10 DIAGNOSIS — M9903 Segmental and somatic dysfunction of lumbar region: Secondary | ICD-10-CM | POA: Diagnosis not present

## 2021-04-14 DIAGNOSIS — M5451 Vertebrogenic low back pain: Secondary | ICD-10-CM | POA: Diagnosis not present

## 2021-04-14 DIAGNOSIS — M5136 Other intervertebral disc degeneration, lumbar region: Secondary | ICD-10-CM | POA: Diagnosis not present

## 2021-04-14 DIAGNOSIS — M9903 Segmental and somatic dysfunction of lumbar region: Secondary | ICD-10-CM | POA: Diagnosis not present

## 2021-04-14 DIAGNOSIS — M4126 Other idiopathic scoliosis, lumbar region: Secondary | ICD-10-CM | POA: Diagnosis not present

## 2021-04-14 DIAGNOSIS — M5137 Other intervertebral disc degeneration, lumbosacral region: Secondary | ICD-10-CM | POA: Diagnosis not present

## 2021-04-16 DIAGNOSIS — M4126 Other idiopathic scoliosis, lumbar region: Secondary | ICD-10-CM | POA: Diagnosis not present

## 2021-04-16 DIAGNOSIS — M5451 Vertebrogenic low back pain: Secondary | ICD-10-CM | POA: Diagnosis not present

## 2021-04-16 DIAGNOSIS — M5137 Other intervertebral disc degeneration, lumbosacral region: Secondary | ICD-10-CM | POA: Diagnosis not present

## 2021-04-16 DIAGNOSIS — M9903 Segmental and somatic dysfunction of lumbar region: Secondary | ICD-10-CM | POA: Diagnosis not present

## 2021-04-16 DIAGNOSIS — M5136 Other intervertebral disc degeneration, lumbar region: Secondary | ICD-10-CM | POA: Diagnosis not present

## 2021-05-05 DIAGNOSIS — M5136 Other intervertebral disc degeneration, lumbar region: Secondary | ICD-10-CM | POA: Diagnosis not present

## 2021-05-05 DIAGNOSIS — M4126 Other idiopathic scoliosis, lumbar region: Secondary | ICD-10-CM | POA: Diagnosis not present

## 2021-05-05 DIAGNOSIS — M9903 Segmental and somatic dysfunction of lumbar region: Secondary | ICD-10-CM | POA: Diagnosis not present

## 2021-05-05 DIAGNOSIS — M5451 Vertebrogenic low back pain: Secondary | ICD-10-CM | POA: Diagnosis not present

## 2021-05-05 DIAGNOSIS — M5137 Other intervertebral disc degeneration, lumbosacral region: Secondary | ICD-10-CM | POA: Diagnosis not present

## 2021-05-06 DIAGNOSIS — M5137 Other intervertebral disc degeneration, lumbosacral region: Secondary | ICD-10-CM | POA: Diagnosis not present

## 2021-05-06 DIAGNOSIS — M9903 Segmental and somatic dysfunction of lumbar region: Secondary | ICD-10-CM | POA: Diagnosis not present

## 2021-05-06 DIAGNOSIS — M5451 Vertebrogenic low back pain: Secondary | ICD-10-CM | POA: Diagnosis not present

## 2021-05-06 DIAGNOSIS — M5136 Other intervertebral disc degeneration, lumbar region: Secondary | ICD-10-CM | POA: Diagnosis not present

## 2021-05-06 DIAGNOSIS — M4126 Other idiopathic scoliosis, lumbar region: Secondary | ICD-10-CM | POA: Diagnosis not present

## 2021-05-07 DIAGNOSIS — I10 Essential (primary) hypertension: Secondary | ICD-10-CM | POA: Diagnosis not present

## 2021-05-07 DIAGNOSIS — E785 Hyperlipidemia, unspecified: Secondary | ICD-10-CM | POA: Diagnosis not present

## 2021-05-07 DIAGNOSIS — Z0000 Encounter for general adult medical examination without abnormal findings: Secondary | ICD-10-CM | POA: Diagnosis not present

## 2021-05-07 DIAGNOSIS — Z1159 Encounter for screening for other viral diseases: Secondary | ICD-10-CM | POA: Diagnosis not present

## 2021-05-07 DIAGNOSIS — R7303 Prediabetes: Secondary | ICD-10-CM | POA: Diagnosis not present

## 2021-05-07 DIAGNOSIS — R69 Illness, unspecified: Secondary | ICD-10-CM | POA: Diagnosis not present

## 2021-05-07 DIAGNOSIS — M8588 Other specified disorders of bone density and structure, other site: Secondary | ICD-10-CM | POA: Diagnosis not present

## 2021-05-07 DIAGNOSIS — E559 Vitamin D deficiency, unspecified: Secondary | ICD-10-CM | POA: Diagnosis not present

## 2021-05-07 DIAGNOSIS — E039 Hypothyroidism, unspecified: Secondary | ICD-10-CM | POA: Diagnosis not present

## 2021-05-12 DIAGNOSIS — E785 Hyperlipidemia, unspecified: Secondary | ICD-10-CM | POA: Diagnosis not present

## 2021-05-12 DIAGNOSIS — M170 Bilateral primary osteoarthritis of knee: Secondary | ICD-10-CM | POA: Diagnosis not present

## 2021-05-12 DIAGNOSIS — E039 Hypothyroidism, unspecified: Secondary | ICD-10-CM | POA: Diagnosis not present

## 2021-05-12 DIAGNOSIS — I10 Essential (primary) hypertension: Secondary | ICD-10-CM | POA: Diagnosis not present

## 2021-05-13 ENCOUNTER — Other Ambulatory Visit: Payer: Self-pay

## 2021-05-13 ENCOUNTER — Encounter: Payer: Self-pay | Admitting: Orthopaedic Surgery

## 2021-05-13 ENCOUNTER — Ambulatory Visit: Payer: Medicare HMO | Admitting: Orthopaedic Surgery

## 2021-05-13 DIAGNOSIS — M5442 Lumbago with sciatica, left side: Secondary | ICD-10-CM

## 2021-05-13 DIAGNOSIS — G8929 Other chronic pain: Secondary | ICD-10-CM

## 2021-05-13 NOTE — Progress Notes (Signed)
Office Visit Note   Patient: Melissa [name]           Date of Birth: 07/02/1951           MRN: [id number] Visit Date: 05/13/2021              Requested by: Marda Stalker, PA-C [place],  [place] [address] PCP: Marda Stalker, PA-C   Assessment & Plan: Visit Diagnoses:  1. Chronic left-sided low back pain with left-sided sciatica     Plan: Given her continued left lower extremity radicular symptoms combined with the failure of conservative treatment with all modalities, it is appropriate to set her up for an epidural steroid injection with Dr. Ernestina Patches to the left-sided L5-S1 leg 1 that was performed in 2019.  She agrees to this and is requesting it as well.  We will work on getting that scheduled hopefully in the near future.  All questions and concerns were answered and addressed.  Follow-Up Instructions: No follow-ups on file.   Orders:  No orders of the defined types were placed in this encounter.  No orders of the defined types were placed in this encounter.     Procedures: No procedures performed   Clinical Data: No additional findings.   Subjective: Chief Complaint  Patient presents with   Lower Back - Pain  Patient is well-known to [place].  We have replaced both of her knees.  Back in November 2019 she did have a left-sided L 5/S1 injection which was an epidural by Dr. Ernestina Patches.  At that time she was having left-sided sciatica that was really radiating down the lateral aspect of her left leg into her foot.  She said the symptoms of return and she is interested in another injection.  She has tried over 6 months worth of activity modification, anti-inflammatories, and even home exercises.  She has been to a chiropractor for therapy on numerous occasions.  She is still having a recurrence of the same symptoms that she had going on the left side in 2019.  Her lumbar spine MRI in 2019 showed L5 foraminal impingement to the left side due to a disc  protrusion.  She also had an L4-L5 moderate to advanced spinal stenosis and right foraminal impingement.  She still has just symptoms on the left side.  She is requesting an ESI which I think is appropriate at this point given the failure of conservative treatment for over 6 months including all the a forementioned modalities and chiropractic treatment with adjustments.  She is a prediabetic.  HPI  Review of Systems   Objective: Vital Signs: There were no vitals taken for this visit.  Physical Exam She is alert and orient x3 and in no acute distress Ortho Exam Examination of her bilateral total knees show they are stable.  She does have subjective numbness down the lateral aspect of her left leg and into the foot.  She has discomfort with flexion extension lumbar spine instructed sciatic nerve from the left side.  Her reflexes are normal.  She has good strength in lower extremity on both sides. Specialty Comments:  No specialty comments available.  Imaging: No results found.   PMFS History: Patient Active Problem List   Diagnosis Date Noted   Status post total left knee replacement 06/21/2020   Status post total right knee replacement 03/08/2020   Pain in right foot 07/05/2017   Chronic pain of right knee 07/05/2017   Chronic pain of left knee 07/05/2017  Unilateral primary osteoarthritis, left knee 07/05/2017   Unilateral primary osteoarthritis, right knee 07/05/2017   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, right 04/14/2017   Past Medical History:  Diagnosis Date   Arthritis    osteo arthritis   Hypertension    Hypothyroidism    Pneumonia    Pre-diabetes    Takes metformin   Walking pneumonia     Family History  Problem Relation Age of Onset   Breast cancer Maternal Aunt        in 36's    Past Surgical History:  Procedure Laterality Date   JOINT REPLACEMENT     MENISCUS REPAIR     right knee   MULTIPLE TOOTH EXTRACTIONS     with  implants   ovaries removed     2003   TONSILLECTOMY     TOTAL KNEE ARTHROPLASTY Right 03/08/2020   Procedure: RIGHT TOTAL KNEE ARTHROPLASTY AND LEFT KNEE STEROID INJECTION;  Surgeon: Mcarthur Rossetti, MD;  Location: [facility];  Service: Orthopedics;  Laterality: Right;   TOTAL KNEE ARTHROPLASTY Left 06/21/2020   Procedure: LEFT TOTAL KNEE ARTHROPLASTY;  Surgeon: Mcarthur Rossetti, MD;  Location: [facility];  Service: Orthopedics;  Laterality: Left;   Social History   Occupational History    Employer: [employer]  Tobacco Use   Smoking status: Former    Years: 10.00    Pack years: 0.00    Types: Cigarettes   Smokeless tobacco: Never   Tobacco comments:    quit when 70 years old  Vaping Use   Vaping Use: Never used  Substance and Sexual Activity   Alcohol use: Yes    Alcohol/week: 2.0 standard drinks    Types: 1 Glasses of wine, 1 Shots of liquor per week    Comment: daily   Drug use: Never   Sexual activity: Not Currently    Birth control/protection: None

## 2021-05-14 DIAGNOSIS — M5136 Other intervertebral disc degeneration, lumbar region: Secondary | ICD-10-CM | POA: Diagnosis not present

## 2021-05-14 DIAGNOSIS — M4126 Other idiopathic scoliosis, lumbar region: Secondary | ICD-10-CM | POA: Diagnosis not present

## 2021-05-14 DIAGNOSIS — M5137 Other intervertebral disc degeneration, lumbosacral region: Secondary | ICD-10-CM | POA: Diagnosis not present

## 2021-05-14 DIAGNOSIS — M9903 Segmental and somatic dysfunction of lumbar region: Secondary | ICD-10-CM | POA: Diagnosis not present

## 2021-05-14 DIAGNOSIS — M5451 Vertebrogenic low back pain: Secondary | ICD-10-CM | POA: Diagnosis not present

## 2021-05-15 DIAGNOSIS — M4126 Other idiopathic scoliosis, lumbar region: Secondary | ICD-10-CM | POA: Diagnosis not present

## 2021-05-15 DIAGNOSIS — M5136 Other intervertebral disc degeneration, lumbar region: Secondary | ICD-10-CM | POA: Diagnosis not present

## 2021-05-15 DIAGNOSIS — M9903 Segmental and somatic dysfunction of lumbar region: Secondary | ICD-10-CM | POA: Diagnosis not present

## 2021-05-15 DIAGNOSIS — M5137 Other intervertebral disc degeneration, lumbosacral region: Secondary | ICD-10-CM | POA: Diagnosis not present

## 2021-05-15 DIAGNOSIS — M5451 Vertebrogenic low back pain: Secondary | ICD-10-CM | POA: Diagnosis not present

## 2021-05-19 DIAGNOSIS — M5137 Other intervertebral disc degeneration, lumbosacral region: Secondary | ICD-10-CM | POA: Diagnosis not present

## 2021-05-19 DIAGNOSIS — M4126 Other idiopathic scoliosis, lumbar region: Secondary | ICD-10-CM | POA: Diagnosis not present

## 2021-05-19 DIAGNOSIS — M5451 Vertebrogenic low back pain: Secondary | ICD-10-CM | POA: Diagnosis not present

## 2021-05-19 DIAGNOSIS — M5136 Other intervertebral disc degeneration, lumbar region: Secondary | ICD-10-CM | POA: Diagnosis not present

## 2021-05-19 DIAGNOSIS — M9903 Segmental and somatic dysfunction of lumbar region: Secondary | ICD-10-CM | POA: Diagnosis not present

## 2021-05-21 DIAGNOSIS — M4126 Other idiopathic scoliosis, lumbar region: Secondary | ICD-10-CM | POA: Diagnosis not present

## 2021-05-21 DIAGNOSIS — M5137 Other intervertebral disc degeneration, lumbosacral region: Secondary | ICD-10-CM | POA: Diagnosis not present

## 2021-05-21 DIAGNOSIS — M5136 Other intervertebral disc degeneration, lumbar region: Secondary | ICD-10-CM | POA: Diagnosis not present

## 2021-05-21 DIAGNOSIS — M5451 Vertebrogenic low back pain: Secondary | ICD-10-CM | POA: Diagnosis not present

## 2021-05-21 DIAGNOSIS — M9903 Segmental and somatic dysfunction of lumbar region: Secondary | ICD-10-CM | POA: Diagnosis not present

## 2021-05-29 DIAGNOSIS — M5136 Other intervertebral disc degeneration, lumbar region: Secondary | ICD-10-CM | POA: Diagnosis not present

## 2021-05-29 DIAGNOSIS — M4126 Other idiopathic scoliosis, lumbar region: Secondary | ICD-10-CM | POA: Diagnosis not present

## 2021-05-29 DIAGNOSIS — M5451 Vertebrogenic low back pain: Secondary | ICD-10-CM | POA: Diagnosis not present

## 2021-05-29 DIAGNOSIS — M5137 Other intervertebral disc degeneration, lumbosacral region: Secondary | ICD-10-CM | POA: Diagnosis not present

## 2021-05-29 DIAGNOSIS — M9903 Segmental and somatic dysfunction of lumbar region: Secondary | ICD-10-CM | POA: Diagnosis not present

## 2021-06-03 ENCOUNTER — Ambulatory Visit: Payer: Self-pay

## 2021-06-03 ENCOUNTER — Other Ambulatory Visit: Payer: Self-pay

## 2021-06-03 ENCOUNTER — Ambulatory Visit (INDEPENDENT_AMBULATORY_CARE_PROVIDER_SITE_OTHER): Payer: Medicare HMO | Admitting: Physical Medicine and Rehabilitation

## 2021-06-03 ENCOUNTER — Encounter: Payer: Self-pay | Admitting: Physical Medicine and Rehabilitation

## 2021-06-03 VITALS — BP 115/76 | HR 78

## 2021-06-03 DIAGNOSIS — M5416 Radiculopathy, lumbar region: Secondary | ICD-10-CM | POA: Diagnosis not present

## 2021-06-03 MED ORDER — BETAMETHASONE SOD PHOS & ACET 6 (3-3) MG/ML IJ SUSP
12.0000 mg | Freq: Once | INTRAMUSCULAR | Status: AC
  Administered 2021-06-03: 12 mg

## 2021-06-03 NOTE — Progress Notes (Signed)
Pt state lower back pain that travels down her left leg. Pt state laying down and getting into a car makes the pain worse. Pt state she takes over the counter pain meds and uses ice to help ease her pain.  Numeric Pain Rating Scale and Functional Assessment Average Pain 3   In the last MONTH (on 0-10 scale) has pain interfered with the following?  1. General activity like being  able to carry out your everyday physical activities such as walking, climbing stairs, carrying groceries, or moving a chair?  Rating(7)   +Driver, -BT, -Dye Allergies.

## 2021-06-03 NOTE — Patient Instructions (Signed)

## 2021-06-05 NOTE — Progress Notes (Signed)
Melissa [name] - 70 y.o. female MRN [id number]  Date of birth: [birth year]  Office Visit Note: Visit Date: 06/03/2021 PCP: Marda Stalker, PA-C Referred by: Marda Stalker, PA-C  Subjective: Chief Complaint  Patient presents with   Lower Back - Pain   Left Leg - Pain   HPI:  Melissa [name] is a 70 y.o. female who comes in today at the request of Dr. Jean Rosenthal for planned Left L5-S1 Lumbar Interlaminar epidural steroid injection with fluoroscopic guidance.  The patient has failed conservative care including home exercise, medications, time and activity modification.  This injection will be diagnostic and hopefully therapeutic.  Please see requesting physician notes for further details and justification.  Depending on results would look at transforaminal approach at L5.  If continued back pain but better leg pain consider medial branch blocks with goal towards radiofrequency ablation.  Could look at repeat MRI at some point if pain progresses.     ROS Otherwise per HPI.  Assessment & Plan: Visit Diagnoses:    ICD-10-CM   1. Lumbar radiculopathy  M54.16 XR C-ARM NO REPORT    Epidural Steroid injection    betamethasone acetate-betamethasone sodium phosphate (CELESTONE) injection 12 mg      Plan: No additional findings.   Meds & Orders:  Meds ordered this encounter  Medications   betamethasone acetate-betamethasone sodium phosphate (CELESTONE) injection 12 mg    Orders Placed This Encounter  Procedures   XR C-ARM NO REPORT   Epidural Steroid injection    Follow-up: Return if symptoms worsen or fail to improve.   Procedures: No procedures performed  Lumbar Epidural Steroid Injection - Interlaminar Approach with Fluoroscopic Guidance  Patient: Melissa [name]      Date of Birth: 1951-10-16 MRN: [id number] PCP: Marda Stalker, PA-C      Visit Date: 06/03/2021   Universal Protocol:     Consent Given By: the patient  Position: PRONE  Additional  Comments: Vital signs were monitored before and after the procedure. Patient was prepped and draped in the usual sterile fashion. The correct patient, procedure, and site was verified.   Injection Procedure Details:   Procedure diagnoses: Lumbar radiculopathy [M54.16]   Meds Administered:  Meds ordered this encounter  Medications   betamethasone acetate-betamethasone sodium phosphate (CELESTONE) injection 12 mg     Laterality: Left  Location/Site:  L5-S1  Needle: 4.5 in., 20 ga. Tuohy  Needle Placement: Paramedian epidural  Findings:   -Comments: Excellent flow of contrast into the epidural space.  Procedure Details: Using a paramedian approach from the side mentioned above, the region overlying the inferior lamina was localized under fluoroscopic visualization and the soft tissues overlying this structure were infiltrated with 4 ml. of 1% Lidocaine without Epinephrine. The Tuohy needle was inserted into the epidural space using a paramedian approach.   The epidural space was localized using loss of resistance along with counter oblique bi-planar fluoroscopic views.  After negative aspirate for air, blood, and CSF, a 2 ml. volume of Isovue-250 was injected into the epidural space and the flow of contrast was observed. Radiographs were obtained for documentation purposes.    The injectate was administered into the level noted above.   Additional Comments:  The patient tolerated the procedure well Dressing: 2 x 2 sterile gauze and Band-Aid    Post-procedure details: Patient was observed during the procedure. Post-procedure instructions were reviewed.  Patient left the clinic in stable condition.   Clinical History: MRI LUMBAR SPINE WITHOUT CONTRAST  TECHNIQUE: Multiplanar, multisequence MR imaging of the lumbar spine was performed. No intravenous contrast was administered.   COMPARISON:  Lumbar radiography 09/14/2018   FINDINGS: Segmentation: 5 lumbar type  vertebral bodies based on comparison radiography   Alignment: There is mild scoliosis. Grade 1 anterolisthesis at L4-5.   Vertebrae:  No fracture, evidence of discitis, or bone lesion.   Conus medullaris and cauda equina: Conus extends to the L1 level. Conus and cauda equina appear normal.   Paraspinal and other soft tissues: Negative   Disc levels:   T12- L1: Unremarkable.   L1-L2: Disc narrowing and circumferential bulging with endplate spur. Mild facet spurring.   L2-L3: Disc narrowing and bulging asymmetric to the left. Mild facet hypertrophy. No impingement   L3-L4: Mild facet spurring. Disc narrowing and circumferential bulging. Mild spinal stenosis.   L4-L5: Advanced facet arthropathy with spurring and anterolisthesis. The disc is narrowed and bulging preferentially towards the right. Degenerative changes and dorsal epidural fat expansion causes moderate to advanced spinal stenosis. There is right foraminal impingement with L4 flattening.   L5-S1:Facet degeneration with moderate spurring. Mild disc narrowing and bulging. There is a left foraminal herniation impinging on the L5 nerve root-see sagittal sequences   IMPRESSION: 1. Disc and facet degeneration with L4-5 anterolisthesis and mild scoliosis. 2. On the symptomatic left side there is L5 foraminal impingement due to disc protrusion. 3. L4-5 moderate to advanced spinal stenosis. Right foraminal impingement.     Electronically Signed   By: Monte Fantasia M.D.   On: 09/29/2018 12:49     Objective:  VS:  HT:    WT:   BMI:     BP:115/76  HR:78bpm  TEMP: ( )  RESP:  Physical Exam Vitals and nursing note reviewed.  Constitutional:      General: She is not in acute distress.    Appearance: Normal appearance. She is obese. She is not ill-appearing.  HENT:     Head: Normocephalic and atraumatic.     Right Ear: External ear normal.     Left Ear: External ear normal.  Eyes:     Extraocular  Movements: Extraocular movements intact.  Cardiovascular:     Rate and Rhythm: Normal rate.     Pulses: Normal pulses.  Pulmonary:     Effort: Pulmonary effort is normal. No respiratory distress.  Abdominal:     General: There is no distension.     Palpations: Abdomen is soft.  Musculoskeletal:        General: Tenderness present.     Cervical back: Neck supple.     Right lower leg: No edema.     Left lower leg: No edema.     Comments: Patient has good distal strength with no pain over the greater trochanters.  No clonus or focal weakness.  Skin:    Findings: No erythema, lesion or rash.  Neurological:     General: No focal deficit present.     Mental Status: She is alert and oriented to person, place, and time.     Sensory: No sensory deficit.     Motor: No weakness or abnormal muscle tone.     Coordination: Coordination normal.  Psychiatric:        Mood and Affect: Mood normal.        Behavior: Behavior normal.     Imaging: No results found.

## 2021-06-05 NOTE — Procedures (Signed)
Lumbar Epidural Steroid Injection - Interlaminar Approach with Fluoroscopic Guidance  Patient: Melissa [name]      Date of Birth: October 15, 1951 MRN: [id number] PCP: Marda Stalker, PA-C      Visit Date: 06/03/2021   Universal Protocol:     Consent Given By: the patient  Position: PRONE  Additional Comments: Vital signs were monitored before and after the procedure. Patient was prepped and draped in the usual sterile fashion. The correct patient, procedure, and site was verified.   Injection Procedure Details:   Procedure diagnoses: Lumbar radiculopathy [M54.16]   Meds Administered:  Meds ordered this encounter  Medications   betamethasone acetate-betamethasone sodium phosphate (CELESTONE) injection 12 mg     Laterality: Left  Location/Site:  L5-S1  Needle: 4.5 in., 20 ga. Tuohy  Needle Placement: Paramedian epidural  Findings:   -Comments: Excellent flow of contrast into the epidural space.  Procedure Details: Using a paramedian approach from the side mentioned above, the region overlying the inferior lamina was localized under fluoroscopic visualization and the soft tissues overlying this structure were infiltrated with 4 ml. of 1% Lidocaine without Epinephrine. The Tuohy needle was inserted into the epidural space using a paramedian approach.   The epidural space was localized using loss of resistance along with counter oblique bi-planar fluoroscopic views.  After negative aspirate for air, blood, and CSF, a 2 ml. volume of Isovue-250 was injected into the epidural space and the flow of contrast was observed. Radiographs were obtained for documentation purposes.    The injectate was administered into the level noted above.   Additional Comments:  The patient tolerated the procedure well Dressing: 2 x 2 sterile gauze and Band-Aid    Post-procedure details: Patient was observed during the procedure. Post-procedure instructions were reviewed.  Patient left the  clinic in stable condition.

## 2021-06-17 DIAGNOSIS — M5451 Vertebrogenic low back pain: Secondary | ICD-10-CM | POA: Diagnosis not present

## 2021-06-17 DIAGNOSIS — M9903 Segmental and somatic dysfunction of lumbar region: Secondary | ICD-10-CM | POA: Diagnosis not present

## 2021-06-17 DIAGNOSIS — M5137 Other intervertebral disc degeneration, lumbosacral region: Secondary | ICD-10-CM | POA: Diagnosis not present

## 2021-06-17 DIAGNOSIS — M4126 Other idiopathic scoliosis, lumbar region: Secondary | ICD-10-CM | POA: Diagnosis not present

## 2021-06-17 DIAGNOSIS — M5136 Other intervertebral disc degeneration, lumbar region: Secondary | ICD-10-CM | POA: Diagnosis not present

## 2021-06-24 DIAGNOSIS — M5136 Other intervertebral disc degeneration, lumbar region: Secondary | ICD-10-CM | POA: Diagnosis not present

## 2021-06-24 DIAGNOSIS — M9903 Segmental and somatic dysfunction of lumbar region: Secondary | ICD-10-CM | POA: Diagnosis not present

## 2021-06-24 DIAGNOSIS — M4126 Other idiopathic scoliosis, lumbar region: Secondary | ICD-10-CM | POA: Diagnosis not present

## 2021-06-24 DIAGNOSIS — M5451 Vertebrogenic low back pain: Secondary | ICD-10-CM | POA: Diagnosis not present

## 2021-06-24 DIAGNOSIS — M5137 Other intervertebral disc degeneration, lumbosacral region: Secondary | ICD-10-CM | POA: Diagnosis not present

## 2021-07-21 ENCOUNTER — Telehealth: Payer: Self-pay | Admitting: Physical Medicine and Rehabilitation

## 2021-07-21 NOTE — Telephone Encounter (Signed)
Patient called needing to schedule an appointment with Dr Ernestina Patches    Pt number [phone]

## 2021-07-21 NOTE — Telephone Encounter (Signed)
Left L5-S1 IL on 06/03/21. Ok to repeat if helped, same problem/side, and no new injury?

## 2021-07-22 NOTE — Telephone Encounter (Signed)
Left message #1

## 2021-07-22 NOTE — Telephone Encounter (Signed)
Needs auth and scheduling for L5 TF.

## 2021-08-06 ENCOUNTER — Ambulatory Visit (INDEPENDENT_AMBULATORY_CARE_PROVIDER_SITE_OTHER): Payer: Medicare HMO

## 2021-08-06 ENCOUNTER — Ambulatory Visit: Payer: Medicare HMO | Admitting: Orthopaedic Surgery

## 2021-08-06 ENCOUNTER — Encounter: Payer: Self-pay | Admitting: Orthopaedic Surgery

## 2021-08-06 ENCOUNTER — Other Ambulatory Visit: Payer: Self-pay

## 2021-08-06 DIAGNOSIS — M25512 Pain in left shoulder: Secondary | ICD-10-CM

## 2021-08-06 MED ORDER — LIDOCAINE HCL 1 % IJ SOLN
3.0000 mL | INTRAMUSCULAR | Status: AC | PRN
Start: 2021-08-06 — End: 2021-08-06
  Administered 2021-08-06: 3 mL

## 2021-08-06 MED ORDER — METHYLPREDNISOLONE ACETATE 40 MG/ML IJ SUSP
40.0000 mg | INTRAMUSCULAR | Status: AC | PRN
  Administered 2021-08-06: 40 mg via INTRA_ARTICULAR

## 2021-08-06 NOTE — Progress Notes (Signed)
Office Visit Note   Patient: Melissa [name]           Date of Birth: [birth year]           MRN: [id number] Visit Date: 08/06/2021              Requested by: Marda Stalker, PA-C [place],  [place] [address] PCP: Marda Stalker, PA-C   Assessment & Plan: Visit Diagnoses:  1. Acute pain of left shoulder     Plan: The patient signs and symptoms are consistent with shoulder impingement syndrome from the left shoulder.  Even though she is having an epidural steroid injection tomorrow, I am fine with providing a steroid injection in her left shoulder subacromially today since her blood glucose is under excellent control and she is only prediabetic and not on medications for this.  I have advocated still weight loss and activity modification.  I will see her back in 6 weeks to see how she is doing overall from both injections.  Follow-Up Instructions: Return in about 6 weeks (around 09/17/2021).   Orders:  Orders Placed This Encounter  Procedures   Large Joint Inj   XR Shoulder Left   No orders of the defined types were placed in this encounter.     Procedures: Large Joint Inj: L subacromial bursa on 08/06/2021 9:37 AM Indications: pain and diagnostic evaluation Details: 22 G 1.5 in needle  Arthrogram: No  Medications: 3 mL lidocaine 1 %; 40 mg methylPREDNISolone acetate 40 MG/ML Outcome: tolerated well, no immediate complications Procedure, treatment alternatives, risks and benefits explained, specific risks discussed. Consent was given by the patient. Immediately prior to procedure a time out was called to verify the correct patient, procedure, equipment, support staff and site/side marked as required. Patient was prepped and draped in the usual sterile fashion.      Clinical Data: No additional findings.   Subjective: Chief Complaint  Patient presents with   Left Shoulder - Pain  The patient comes in today with a chief complaint of left shoulder  pain that hurts more in the subdeltoid area where she points to.  She denies any injury but is been getting worse for a couple months now.  She said it hurts with reaching behind her and overhead but she is had no loss of motion of the shoulder.  She never had shoulder surgery either.  She has a history of bilateral knee replacements.  She feels like may be pushing up from the seated position and using walker for a while may have stressed that shoulder some.  She is scheduled for an epidural steroid injection tomorrow by Dr. Ernestina Patches.  She is a prediabetic.  HPI  Review of Systems There is currently listed no headache, chest pain, shortness of breath, fever, chills, nausea, vomiting  Objective: Vital Signs: There were no vitals taken for this visit.  Physical Exam She is alert and orient x3 and in no acute distress Ortho Exam Examination of the left shoulder shows that move smoothly and fluidly but she does show some signs of impingement.  The rotator cuff itself feels to be intact. Specialty Comments:  No specialty comments available.  Imaging: XR Shoulder Left  Result Date: 08/06/2021 3 views of the left shoulder show no acute findings.  The shoulder is well located.  There is moderate AC joint arthritic changes.  There is some narrowing of the subacromial outlet as well.    PMFS History: Patient Active Problem List  Diagnosis Date Noted   Status post total left knee replacement 06/21/2020   Status post total right knee replacement 03/08/2020   Pain in right foot 07/05/2017   Chronic pain of right knee 07/05/2017   Chronic pain of left knee 07/05/2017   Unilateral primary osteoarthritis, left knee 07/05/2017   Unilateral primary osteoarthritis, right knee 07/05/2017   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, right 04/14/2017   Past Medical History:  Diagnosis Date   Arthritis    osteo arthritis   Hypertension    Hypothyroidism    Pneumonia     Pre-diabetes    Takes metformin   Walking pneumonia     Family History  Problem Relation Age of Onset   Breast cancer Maternal Aunt        in 68's    Past Surgical History:  Procedure Laterality Date   JOINT REPLACEMENT     MENISCUS REPAIR     right knee   MULTIPLE TOOTH EXTRACTIONS     with implants   ovaries removed     2003   TONSILLECTOMY     TOTAL KNEE ARTHROPLASTY Right 03/08/2020   Procedure: RIGHT TOTAL KNEE ARTHROPLASTY AND LEFT KNEE STEROID INJECTION;  Surgeon: Mcarthur Rossetti, MD;  Location: [facility];  Service: Orthopedics;  Laterality: Right;   TOTAL KNEE ARTHROPLASTY Left 06/21/2020   Procedure: LEFT TOTAL KNEE ARTHROPLASTY;  Surgeon: Mcarthur Rossetti, MD;  Location: [facility];  Service: Orthopedics;  Laterality: Left;   Social History   Occupational History    Employer: [employer]  Tobacco Use   Smoking status: Former    Years: 10.00    Types: Cigarettes   Smokeless tobacco: Never   Tobacco comments:    quit when 70 years old  Vaping Use   Vaping Use: Never used  Substance and Sexual Activity   Alcohol use: Yes    Alcohol/week: 2.0 standard drinks    Types: 1 Glasses of wine, 1 Shots of liquor per week    Comment: daily   Drug use: Never   Sexual activity: Not Currently    Birth control/protection: None

## 2021-08-07 ENCOUNTER — Ambulatory Visit (INDEPENDENT_AMBULATORY_CARE_PROVIDER_SITE_OTHER): Payer: Medicare HMO | Admitting: Physical Medicine and Rehabilitation

## 2021-08-07 ENCOUNTER — Ambulatory Visit: Payer: Self-pay

## 2021-08-07 VITALS — BP 133/66 | HR 80

## 2021-08-07 DIAGNOSIS — M5416 Radiculopathy, lumbar region: Secondary | ICD-10-CM

## 2021-08-07 MED ORDER — METHYLPREDNISOLONE ACETATE 80 MG/ML IJ SUSP
80.0000 mg | Freq: Once | INTRAMUSCULAR | Status: AC
  Administered 2021-08-07: 80 mg

## 2021-08-07 NOTE — Progress Notes (Signed)
Melissa [name] - 70 y.o. female MRN [id number]  Date of birth: 1951-08-28  Office Visit Note: Visit Date: 08/07/2021 PCP: Marda Stalker, PA-C Referred by: Marda Stalker, PA-C  Subjective: Chief Complaint  Patient presents with   Lower Back - Pain   Left Hip - Pain   HPI:  Melissa [name] is a 70 y.o. female who comes in today at the request of Dr. Jean Rosenthal for planned Left L5-S1 Lumbar Transforaminal epidural steroid injection with fluoroscopic guidance.  The patient has failed conservative care including home exercise, medications, time and activity modification.  This injection will be diagnostic and hopefully therapeutic.  Please see requesting physician notes for further details and justification.   ROS Otherwise per HPI.  Assessment & Plan: Visit Diagnoses:    ICD-10-CM   1. Lumbar radiculopathy  M54.16 XR C-ARM NO REPORT    Epidural Steroid injection    methylPREDNISolone acetate (DEPO-MEDROL) injection 80 mg      Plan: No additional findings.   Meds & Orders:  Meds ordered this encounter  Medications   methylPREDNISolone acetate (DEPO-MEDROL) injection 80 mg    Orders Placed This Encounter  Procedures   XR C-ARM NO REPORT   Epidural Steroid injection    Follow-up: Return if symptoms worsen or fail to improve.   Procedures: No procedures performed  Lumbosacral Transforaminal Epidural Steroid Injection - Sub-Pedicular Approach with Fluoroscopic Guidance  Patient: Melissa [name]      Date of Birth: 01/22/1951 MRN: [id number] PCP: Marda Stalker, PA-C      Visit Date: 08/07/2021   Universal Protocol:    Date/Time: 08/07/2021  Consent Given By: the patient  Position: PRONE  Additional Comments: Vital signs were monitored before and after the procedure. Patient was prepped and draped in the usual sterile fashion. The correct patient, procedure, and site was verified.   Injection Procedure Details:   Procedure diagnoses:  Lumbar radiculopathy [M54.16]    Meds Administered:  Meds ordered this encounter  Medications   methylPREDNISolone acetate (DEPO-MEDROL) injection 80 mg    Laterality: Left  Location/Site: L4  Needle:5.0 in., 22 ga.  Short bevel or Quincke spinal needle  Needle Placement: Transforaminal  Findings:    -Comments: Excellent flow of contrast along the nerve, nerve root and into the epidural space.  Procedure Details: After squaring off the end-plates to get a true AP view, the C-arm was positioned so that an oblique view of the foramen as noted above was visualized. The target area is just inferior to the "nose of the scotty dog" or sub pedicular. The soft tissues overlying this structure were infiltrated with 2-3 ml. of 1% Lidocaine without Epinephrine.  The spinal needle was inserted toward the target using a "trajectory" view along the fluoroscope beam.  Under AP and lateral visualization, the needle was advanced so it did not puncture dura and was located close the 6 O'Clock position of the pedical in AP tracterory. Biplanar projections were used to confirm position. Aspiration was confirmed to be negative for CSF and/or blood. A 1-2 ml. volume of Isovue-250 was injected and flow of contrast was noted at each level. Radiographs were obtained for documentation purposes.   After attaining the desired flow of contrast documented above, a 0.5 to 1.0 ml test dose of 0.25% Marcaine was injected into each respective transforaminal space.  The patient was observed for 90 seconds post injection.  After no sensory deficits were reported, and normal lower extremity motor function was noted,  the above injectate was administered so that equal amounts of the injectate were placed at each foramen (level) into the transforaminal epidural space.   Additional Comments:  The patient tolerated the procedure well Dressing: 2 x 2 sterile gauze and Band-Aid    Post-procedure details: Patient was  observed during the procedure. Post-procedure instructions were reviewed.  Patient left the clinic in stable condition.    Clinical History: MRI LUMBAR SPINE WITHOUT CONTRAST   TECHNIQUE: Multiplanar, multisequence MR imaging of the lumbar spine was performed. No intravenous contrast was administered.   COMPARISON:  Lumbar radiography 09/14/2018   FINDINGS: Segmentation: 5 lumbar type vertebral bodies based on comparison radiography   Alignment: There is mild scoliosis. Grade 1 anterolisthesis at L4-5.   Vertebrae:  No fracture, evidence of discitis, or bone lesion.   Conus medullaris and cauda equina: Conus extends to the L1 level. Conus and cauda equina appear normal.   Paraspinal and other soft tissues: Negative   Disc levels:   T12- L1: Unremarkable.   L1-L2: Disc narrowing and circumferential bulging with endplate spur. Mild facet spurring.   L2-L3: Disc narrowing and bulging asymmetric to the left. Mild facet hypertrophy. No impingement   L3-L4: Mild facet spurring. Disc narrowing and circumferential bulging. Mild spinal stenosis.   L4-L5: Advanced facet arthropathy with spurring and anterolisthesis. The disc is narrowed and bulging preferentially towards the right. Degenerative changes and dorsal epidural fat expansion causes moderate to advanced spinal stenosis. There is right foraminal impingement with L4 flattening.   L5-S1:Facet degeneration with moderate spurring. Mild disc narrowing and bulging. There is a left foraminal herniation impinging on the L5 nerve root-see sagittal sequences   IMPRESSION: 1. Disc and facet degeneration with L4-5 anterolisthesis and mild scoliosis. 2. On the symptomatic left side there is L5 foraminal impingement due to disc protrusion. 3. L4-5 moderate to advanced spinal stenosis. Right foraminal impingement.     Electronically Signed   By: Monte Fantasia M.D.   On: 09/29/2018 12:49     Objective:  VS:  HT:     WT:   BMI:     BP:133/66  HR:80bpm  TEMP: ( )  RESP:  Physical Exam Vitals and nursing note reviewed.  Constitutional:      General: She is not in acute distress.    Appearance: Normal appearance. She is not ill-appearing.  HENT:     Head: Normocephalic and atraumatic.     Right Ear: External ear normal.     Left Ear: External ear normal.  Eyes:     Extraocular Movements: Extraocular movements intact.  Cardiovascular:     Rate and Rhythm: Normal rate.     Pulses: Normal pulses.  Pulmonary:     Effort: Pulmonary effort is normal. No respiratory distress.  Abdominal:     General: There is no distension.     Palpations: Abdomen is soft.  Musculoskeletal:        General: Tenderness present.     Cervical back: Neck supple.     Right lower leg: No edema.     Left lower leg: No edema.     Comments: Patient has good distal strength with no pain over the greater trochanters.  No clonus or focal weakness.  Skin:    Findings: No erythema, lesion or rash.  Neurological:     General: No focal deficit present.     Mental Status: She is alert and oriented to person, place, and time.     Sensory: No sensory  deficit.     Motor: No weakness or abnormal muscle tone.     Coordination: Coordination normal.  Psychiatric:        Mood and Affect: Mood normal.        Behavior: Behavior normal.     Imaging: No results found.

## 2021-08-07 NOTE — Patient Instructions (Signed)

## 2021-08-07 NOTE — Progress Notes (Signed)
Pt state lower back pain that travels to her left side hip and buttocks area. Pt state the inj she had in 09/2018 helped better than the one she had in last. Pt state laying down and getting up out of bed makes the pain worse. Pt state she take over the counter pain meds to help ease her pain. Pt has hx of inj on 06/03/21 pt state it didn't help.  Numeric Pain Rating Scale and Functional Assessment Average Pain 7   In the last MONTH (on 0-10 scale) has pain interfered with the following?  1. General activity like being  able to carry out your everyday physical activities such as walking, climbing stairs, carrying groceries, or moving a chair?  Rating(7)   +Driver, -BT, -Dye Allergies.

## 2021-08-10 NOTE — Procedures (Signed)
Lumbosacral Transforaminal Epidural Steroid Injection - Sub-Pedicular Approach with Fluoroscopic Guidance  Patient: Melissa [name]      Date of Birth: 02/16/1951 MRN: [id number] PCP: Marda Stalker, PA-C      Visit Date: 08/07/2021   Universal Protocol:    Date/Time: 08/07/2021  Consent Given By: the patient  Position: PRONE  Additional Comments: Vital signs were monitored before and after the procedure. Patient was prepped and draped in the usual sterile fashion. The correct patient, procedure, and site was verified.   Injection Procedure Details:   Procedure diagnoses: Lumbar radiculopathy [M54.16]    Meds Administered:  Meds ordered this encounter  Medications   methylPREDNISolone acetate (DEPO-MEDROL) injection 80 mg    Laterality: Left  Location/Site: L4  Needle:5.0 in., 22 ga.  Short bevel or Quincke spinal needle  Needle Placement: Transforaminal  Findings:    -Comments: Excellent flow of contrast along the nerve, nerve root and into the epidural space.  Procedure Details: After squaring off the end-plates to get a true AP view, the C-arm was positioned so that an oblique view of the foramen as noted above was visualized. The target area is just inferior to the "nose of the scotty dog" or sub pedicular. The soft tissues overlying this structure were infiltrated with 2-3 ml. of 1% Lidocaine without Epinephrine.  The spinal needle was inserted toward the target using a "trajectory" view along the fluoroscope beam.  Under AP and lateral visualization, the needle was advanced so it did not puncture dura and was located close the 6 O'Clock position of the pedical in AP tracterory. Biplanar projections were used to confirm position. Aspiration was confirmed to be negative for CSF and/or blood. A 1-2 ml. volume of Isovue-250 was injected and flow of contrast was noted at each level. Radiographs were obtained for documentation purposes.   After attaining the desired  flow of contrast documented above, a 0.5 to 1.0 ml test dose of 0.25% Marcaine was injected into each respective transforaminal space.  The patient was observed for 90 seconds post injection.  After no sensory deficits were reported, and normal lower extremity motor function was noted,   the above injectate was administered so that equal amounts of the injectate were placed at each foramen (level) into the transforaminal epidural space.   Additional Comments:  The patient tolerated the procedure well Dressing: 2 x 2 sterile gauze and Band-Aid    Post-procedure details: Patient was observed during the procedure. Post-procedure instructions were reviewed.  Patient left the clinic in stable condition.

## 2021-09-10 DIAGNOSIS — Z23 Encounter for immunization: Secondary | ICD-10-CM | POA: Diagnosis not present

## 2021-09-15 DIAGNOSIS — B351 Tinea unguium: Secondary | ICD-10-CM | POA: Diagnosis not present

## 2021-09-17 ENCOUNTER — Ambulatory Visit: Payer: Medicare HMO | Admitting: Orthopaedic Surgery

## 2021-09-17 ENCOUNTER — Encounter: Payer: Self-pay | Admitting: Orthopaedic Surgery

## 2021-09-17 DIAGNOSIS — M25512 Pain in left shoulder: Secondary | ICD-10-CM | POA: Diagnosis not present

## 2021-09-17 DIAGNOSIS — G8929 Other chronic pain: Secondary | ICD-10-CM

## 2021-09-17 DIAGNOSIS — M5442 Lumbago with sciatica, left side: Secondary | ICD-10-CM

## 2021-09-17 NOTE — Progress Notes (Signed)
The patient comes in for continued follow-up after having epidural steroid injection of the left-sided L4 by Dr. Ernestina Patches.  She does have some chronic back issues.  She sees a Restaurant manager, fast food but she states that is really not working out for her at this standpoint.  At her last visit I did inject her left shoulder and she said her pain is gone from her left shoulder and feels much better overall.  I have replaced both her knees.  She had such bad knees for a long period of time I think that really affected her gait and her back.  She is moderately obese.  Most of her pain is still central in the lumbar spine in the lower lumbar spine and does radiate out more to the left than the right.  There is no significant radicular components today.  I feel that she would best benefit from outpatient physical therapy on her lumbar spine with any modalities per the therapist that can help decrease her back pain and improve her back function.  She agrees with this treatment plan.  She is not interested in referral for any type of surgical evaluation for her spine.  We will work on setting her up for outpatient physical therapy for her lumbar spine and then I will see her back in about 6 weeks to see if this is helped.  All questions and concerns were answered and addressed.

## 2021-09-19 ENCOUNTER — Other Ambulatory Visit: Payer: Self-pay

## 2021-09-19 DIAGNOSIS — G8929 Other chronic pain: Secondary | ICD-10-CM

## 2021-09-29 ENCOUNTER — Other Ambulatory Visit: Payer: Self-pay | Admitting: Family Medicine

## 2021-09-29 DIAGNOSIS — Z1231 Encounter for screening mammogram for malignant neoplasm of breast: Secondary | ICD-10-CM

## 2021-10-09 IMAGING — DX DG KNEE 1-2V PORT*R*
2 series · 2 of 2 positions shown · non-contrast
Comparison: 01/29/2020

CLINICAL DATA: Postop for right knee surgery

EXAM:
PORTABLE RIGHT KNEE - 1-2 VIEW

[knee ap]
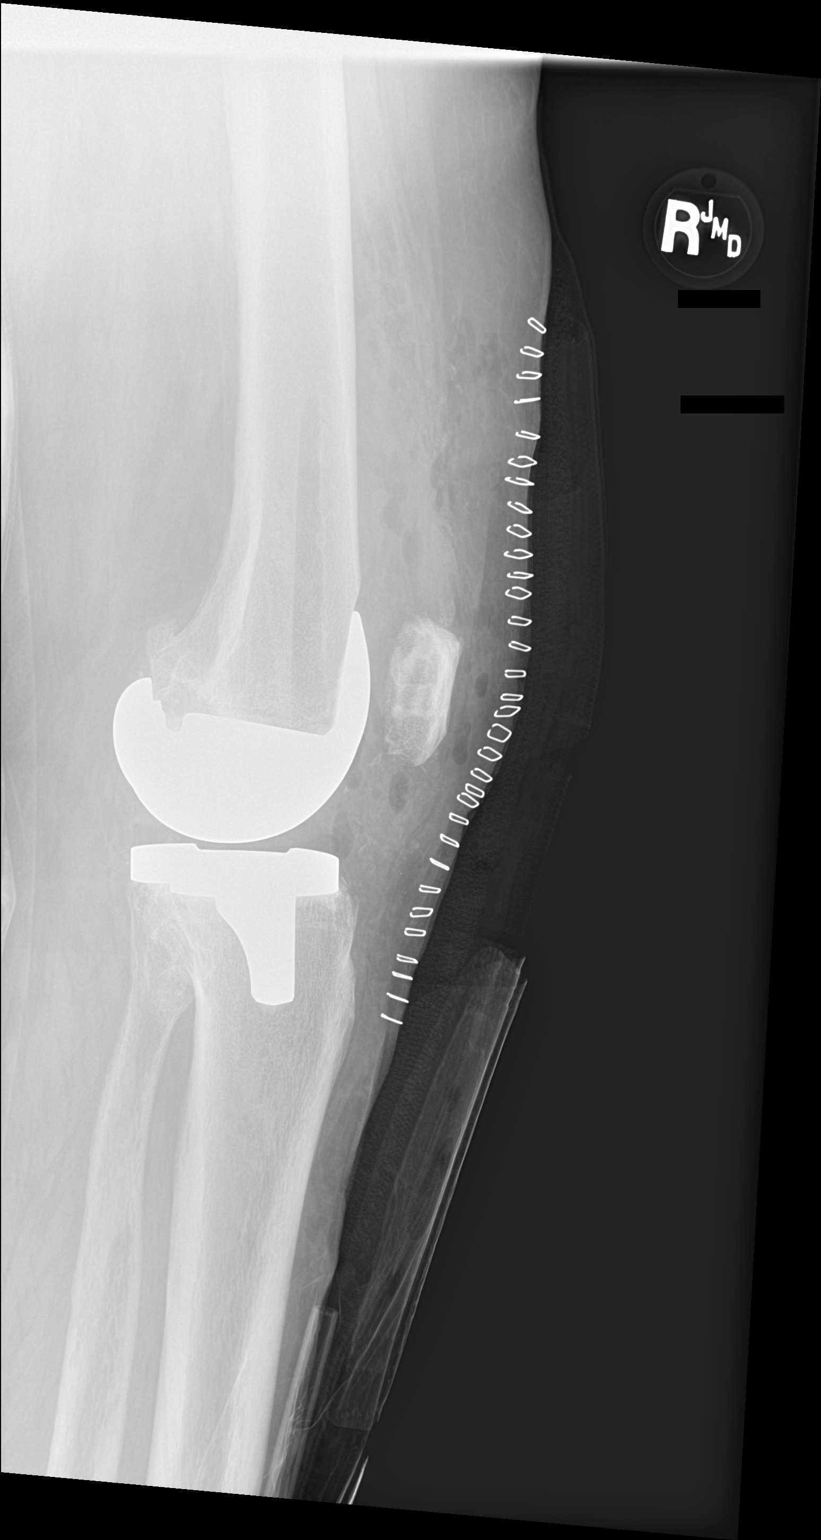

[knee lat]
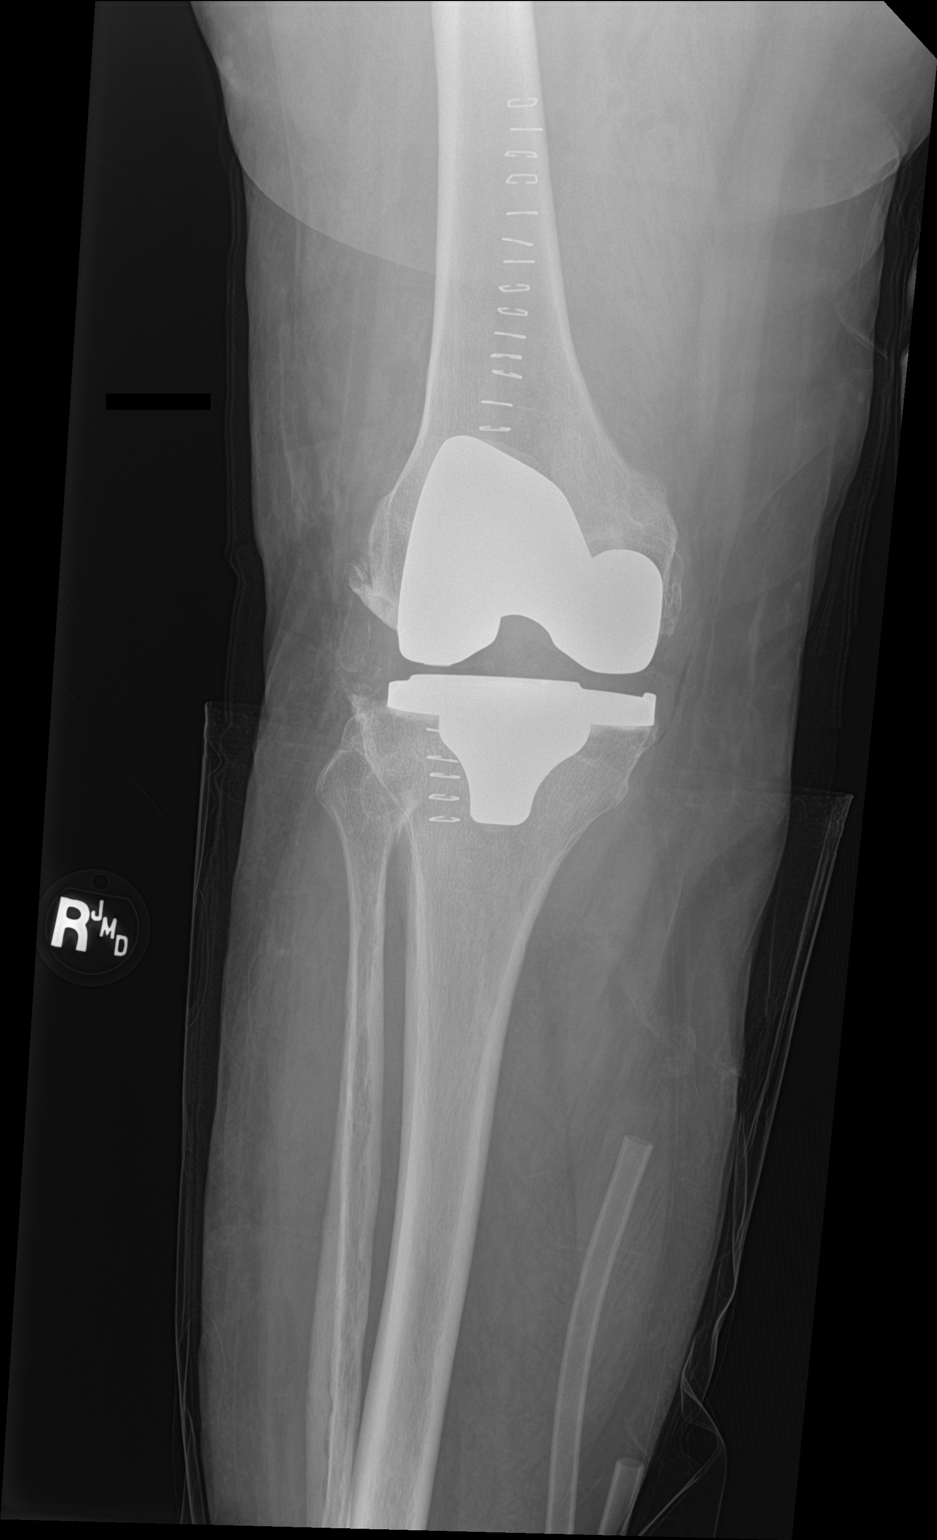

[2 of 2 positions shown; findings below may reference images not displayed]

FINDINGS: Total knee arthroplasty with well-seated prosthesis. No
periprosthetic fracture or subluxation. There is expected
postoperative gas and swelling.
IMPRESSION: No unexpected finding after total knee arthroplasty.

## 2021-10-14 ENCOUNTER — Encounter: Payer: Self-pay | Admitting: Physical Therapy

## 2021-10-14 ENCOUNTER — Ambulatory Visit: Payer: Medicare HMO | Admitting: Physical Therapy

## 2021-10-14 ENCOUNTER — Other Ambulatory Visit: Payer: Self-pay

## 2021-10-14 DIAGNOSIS — M6281 Muscle weakness (generalized): Secondary | ICD-10-CM | POA: Diagnosis not present

## 2021-10-14 DIAGNOSIS — M5442 Lumbago with sciatica, left side: Secondary | ICD-10-CM | POA: Diagnosis not present

## 2021-10-14 DIAGNOSIS — R262 Difficulty in walking, not elsewhere classified: Secondary | ICD-10-CM

## 2021-10-14 DIAGNOSIS — G8929 Other chronic pain: Secondary | ICD-10-CM | POA: Diagnosis not present

## 2021-10-14 NOTE — Therapy (Signed)
[facility] Physical Therapy [address] [place], [place], [address] Phone: [phone]   Fax:  [phone]  Physical Therapy Evaluation  Patient Details  Name: Melissa [name] MRN: [id number] Date of Birth: 1951/05/09 Referring Provider (PT): Jean Rosenthal MD   Encounter Date: 10/14/2021   PT End of Session - 10/14/21 0811     Visit Number 1    Number of Visits 13    Date for PT Re-Evaluation 11/28/21    PT Start Time 0803    PT Stop Time 2831    PT Time Calculation (min) 40 min    Activity Tolerance Patient tolerated treatment well    Behavior During Therapy [facility] for tasks assessed/performed             Past Medical History:  Diagnosis Date   Arthritis    osteo arthritis   Hypertension    Hypothyroidism    Pneumonia    Pre-diabetes    Takes metformin   Walking pneumonia     Past Surgical History:  Procedure Laterality Date   JOINT REPLACEMENT     MENISCUS REPAIR     right knee   MULTIPLE TOOTH EXTRACTIONS     with implants   ovaries removed     2003   TONSILLECTOMY     TOTAL KNEE ARTHROPLASTY Right 03/08/2020   Procedure: RIGHT TOTAL KNEE ARTHROPLASTY AND LEFT KNEE STEROID INJECTION;  Surgeon: Mcarthur Rossetti, MD;  Location: [facility];  Service: Orthopedics;  Laterality: Right;   TOTAL KNEE ARTHROPLASTY Left 06/21/2020   Procedure: LEFT TOTAL KNEE ARTHROPLASTY;  Surgeon: Mcarthur Rossetti, MD;  Location: [facility];  Service: Orthopedics;  Laterality: Left;    There were no vitals filed for this visit.    Subjective Assessment - 10/14/21 0805     Subjective Pt arrving to therapy reporting chronic low back pain on the left side with radiation down her left leg. Pt stating her pain is worse in the morning. Pt reproting 4-5/10 pain at present. Pt states she works through her pain at home.    Pertinent History arthritis, HTN, hypothyroidism, pneumonia, Pre diabetes  Rt TKA 03/08/2020, Lt TKA 06/21/2020    Limitations  Walking;House hold activities;Sitting;Lifting    Diagnostic tests X-ray    Patient Stated Goals Get rid of pain    Currently in Pain? Yes    Pain Score 5     Pain Location Back    Pain Orientation Left;Lower    Pain Descriptors / Indicators Aching;Burning    Pain Type Chronic pain    Pain Radiating Towards gluteals    Pain Frequency Constant    Aggravating Factors  when first waking up    Pain Relieving Factors advil    Effect of Pain on Daily Activities painful with ALD's                [facility] PT Assessment - 10/14/21 0001       Assessment   Medical Diagnosis M54.42 left sided low back pain with left sciatica    Referring Provider (PT) Jean Rosenthal MD    Hand Dominance Left    Next MD Visit f/u in 4-6 weeks per pt report    Prior Therapy yes for knees      Precautions   Precautions None      Restrictions   Weight Bearing Restrictions No      Balance Screen   Has the patient fallen in the past 6 months No  Is the patient reluctant to leave their home because of a fear of falling?  No      Home Environment   Living Environment Private residence    Type of [place] Access Stairs to enter    Entrance Stairs-Number of Steps 4    Entrance Stairs-Rails None    Home Layout Two level    Alternate Level Stairs-Number of Steps 14    Alternate Level Stairs-Rails Right      Prior Function   Level of Independence Independent    Vocation Full time employment      Cognition   Overall Cognitive Status Within Functional Limits for tasks assessed      Observation/Other Assessments   Focus on Therapeutic Outcomes (FOTO)  65% (predicted 69%)      Posture/Postural Control   Posture/Postural Control Postural limitations    Postural Limitations Rounded Shoulders;Forward head;Decreased lumbar lordosis      ROM / Strength   AROM / PROM / Strength AROM;Strength      Strength   Strength Assessment Site Hip;Knee    Right/Left Hip Right;Left    Right Hip  Flexion 5/5    Right Hip ABduction 4+/5    Right Hip ADduction 4+/5    Left Hip Flexion 4+/5    Left Hip ABduction 4+/5    Left Hip ADduction 4+/5    Right/Left Knee Right;Left    Right Knee Flexion 5/5    Right Knee Extension 5/5    Left Knee Flexion 5/5    Left Knee Extension 4+/5      Palpation   Palpation comment Trigger point note in right lumbar paraspinals, tenderness along left lumbar sacral border and left QL      Special Tests   Other special tests negative slump test bilaterally      Transfers   Five time sit to stand comments  10 seconds with no UE support   low back pain reported on left side     Ambulation/Gait   Assistive device None    Gait Pattern Within Functional Limits                        Objective measurements completed on examination: See above findings.                PT Education - 10/14/21 0810     Education Details PT POC, HEP    Person(s) Educated Patient    Methods Explanation;Demonstration;Handout;Verbal cues;Tactile cues    Comprehension Verbalized understanding;Returned demonstration              PT Short Term Goals - 10/14/21 0812       PT SHORT TERM GOAL #1   Title Pt will be independent in initial HEP    Time 3    Period Weeks    Status New    Target Date 10/31/21      PT SHORT TERM GOAL #2   Title -      PT SHORT TERM GOAL #3   Title -               PT Long Term Goals - 10/14/21 2505       PT LONG TERM GOAL #1   Title Pt will be independent in advanced HEP    Time 6    Period Weeks    Status New      PT LONG TERM GOAL #2   Title  pt will improve FOTO to >/= 69  % function    Baseline 65% on 10/14/2021    Time 6    Period Weeks    Status New    Target Date 11/28/21      PT LONG TERM GOAL #3   Title pt will improve left  LE strength to 5/5 grossly to improve funcitonal mobility.    Time 6    Period Weeks    Status New    Target Date 11/28/21      PT LONG TERM GOAL #4    Title Pt will report pain in her low back and left side of </= 2/10 with ADL's.    Time 6    Period Weeks    Status New    Target Date 11/28/21      PT LONG TERM GOAL #5   Title Pt will improve endurance to ambulate for > or = to 30 minutes in the community without limitations.    Time 6    Period Weeks    Status New    Target Date 11/28/21                    Plan - 10/14/21 0925     Clinical Impression Statement Pt arriving today reporting chronic low back pain with left sided sciatic symptoms. Pt s/p lumbar injection which she states didn't help entirely. Pt reporting constant pain with varying intensities. Pt states she has been to chriopracter with no relief. Pt presenting with left sided low back pain. Pt with negative slump test and SLR on the left. Pt with mild weakness in left hip when compared to the right. Pt with good response to extension exercise in standing. Pt was issued a HEP and DN edu provided for consideration at next visits. Pt could benefit from skilled PT to address her impairments with the below interventions.    Personal Factors and Comorbidities Comorbidity 3+    Comorbidities arthritis, HTN, hypothyroidism, pneumonia, Pre diabetes  Rt TKA 03/08/2020, Lt TKA 06/21/2020    Examination-Activity Limitations Other;Squat;Stand;Carry    Examination-Participation Restrictions Community Activity;Other    Stability/Clinical Decision Making Stable/Uncomplicated    Clinical Decision Making Low    Rehab Potential Good    PT Frequency 2x / week    PT Duration 6 weeks    PT Treatment/Interventions ADLs/Self Care Home Management;Canalith Repostioning;Cryotherapy;Electrical Stimulation;Iontophoresis 4mg /ml Dexamethasone;Moist Heat;Traction;Ultrasound;Gait training;Stair training;Functional mobility training;Therapeutic activities;Patient/family education;Neuromuscular re-education;Balance training;Therapeutic exercise;Manual techniques;Passive range of motion;Dry  needling;Taping;Spinal Manipulations    PT Next Visit Plan extension exercises, LE strengthening, Further assess for DN of lumbar multifidi.    PT Home Exercise Plan (8:37 AM) [facility])  Access Code: [email or web address]: [email or web address]: 11/22/2022Prepared by: Anderson Malta MartinExercises  Hooklying Single Knee to Chest Stretch - 2 x daily - 7 x weekly - 3-5 reps - 20 seconds holdSupine Lower Trunk Rotation - 2 x daily - 7 x weekly - 3-5 reps - 20 seconds holdSupine Posterior Pelvic Tilt - 2 x daily - 7 x weekly - 2 sets - 10 reps - 5 seconds holdSupine Piriformis Stretch with Foot on Ground - 2 x daily - 7 x weekly - 3-5 reps - 20 seconds holdSupine Figure 4 Piriformis Stretch - 2 x daily - 7 x weekly - 3-5 reps - 20 seconds hold  [place]  Patient Portal    (8:37 AM) [facility])  Can you print these out for me please    Consulted and  Agree with Plan of Care Patient             Patient will benefit from skilled therapeutic intervention in order to improve the following deficits and impairments:  Pain, Postural dysfunction, Decreased activity tolerance, Difficulty walking, Decreased balance, Decreased endurance, Obesity, Decreased range of motion, Decreased strength, Decreased mobility  Visit Diagnosis: Chronic left-sided low back pain with left-sided sciatica  Muscle weakness (generalized)  Difficulty in walking, not elsewhere classified     Problem List Patient Active Problem List   Diagnosis Date Noted   Status post total left knee replacement 06/21/2020   Status post total right knee replacement 03/08/2020   Pain in right foot 07/05/2017   Chronic pain of right knee 07/05/2017   Chronic pain of left knee 07/05/2017   Unilateral primary osteoarthritis, left knee 07/05/2017   Unilateral primary osteoarthritis, right knee 07/05/2017   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, right 04/14/2017    Oretha Caprice, PT, MPT 10/14/2021, 9:57 AM  [facility] Physical Therapy [address] [place], [place], [address] Phone: [phone]   Fax:  [phone]  Name: Melissa [name] MRN: [id number] Date of Birth: December 25, 1950

## 2021-10-14 NOTE — Patient Instructions (Addendum)
[  8:[place] [facility]) Access Code: [email or web address]: [email or web address]: 11/22/2022Prepared by: Anderson Malta MartinExercises Hooklying Single Knee to Chest Stretch - 2 x daily - 7 x weekly - 3-5 reps - 20 seconds holdSupine Lower Trunk Rotation - 2 x daily - 7 x weekly - 3-5 reps - 20 seconds holdSupine Posterior Pelvic Tilt - 2 x daily - 7 x weekly - 2 sets - 10 reps - 5 seconds holdSupine Piriformis Stretch with Foot on Ground - 2 x daily - 7 x weekly - 3-5 reps - 20 seconds holdSupine Figure 4 Piriformis Stretch - 2 x daily - 7 x weekly - 3-5 reps - 20 seconds hold [place]  Patient Portal  [8:37 AM] [facility])

## 2021-10-29 ENCOUNTER — Other Ambulatory Visit: Payer: Self-pay

## 2021-10-29 ENCOUNTER — Ambulatory Visit (INDEPENDENT_AMBULATORY_CARE_PROVIDER_SITE_OTHER): Payer: Medicare HMO

## 2021-10-29 ENCOUNTER — Ambulatory Visit: Payer: Medicare HMO | Admitting: Physical Therapy

## 2021-10-29 ENCOUNTER — Encounter: Payer: Self-pay | Admitting: Orthopaedic Surgery

## 2021-10-29 ENCOUNTER — Ambulatory Visit: Payer: Medicare HMO | Admitting: Orthopaedic Surgery

## 2021-10-29 ENCOUNTER — Ambulatory Visit
Admission: RE | Admit: 2021-10-29 | Discharge: 2021-10-29 | Disposition: A | Discharge status: Home / Self Care | Payer: Medicare HMO | Source: Ambulatory Visit | Attending: Family Medicine | Admitting: Family Medicine

## 2021-10-29 DIAGNOSIS — M25511 Pain in right shoulder: Secondary | ICD-10-CM | POA: Diagnosis not present

## 2021-10-29 DIAGNOSIS — M5442 Lumbago with sciatica, left side: Secondary | ICD-10-CM | POA: Diagnosis not present

## 2021-10-29 DIAGNOSIS — G8929 Other chronic pain: Secondary | ICD-10-CM

## 2021-10-29 DIAGNOSIS — M25512 Pain in left shoulder: Secondary | ICD-10-CM

## 2021-10-29 DIAGNOSIS — Z1231 Encounter for screening mammogram for malignant neoplasm of breast: Secondary | ICD-10-CM | POA: Diagnosis not present

## 2021-10-29 DIAGNOSIS — M6281 Muscle weakness (generalized): Secondary | ICD-10-CM | POA: Diagnosis not present

## 2021-10-29 DIAGNOSIS — R262 Difficulty in walking, not elsewhere classified: Secondary | ICD-10-CM | POA: Diagnosis not present

## 2021-10-29 MED ORDER — METHYLPREDNISOLONE ACETATE 40 MG/ML IJ SUSP
40.0000 mg | INTRAMUSCULAR | Status: AC | PRN
  Administered 2021-10-29: 40 mg via INTRA_ARTICULAR

## 2021-10-29 MED ORDER — LIDOCAINE HCL 1 % IJ SOLN
3.0000 mL | INTRAMUSCULAR | Status: AC | PRN
  Administered 2021-10-29: 3 mL

## 2021-10-29 NOTE — Patient Instructions (Signed)
Access Code: [email or web address] URL: [email or web address] Date: 10/29/2021 Prepared by: [place] [facility]  Exercises Hooklying Single Knee to Chest Stretch - 2 x daily - 7 x weekly - 3-5 reps - 20 seconds hold Supine Lower Trunk Rotation - 2 x daily - 7 x weekly - 3-5 reps - 20 seconds hold Supine Posterior Pelvic Tilt - 2 x daily - 7 x weekly - 2 sets - 10 reps - 5 seconds hold Supine Piriformis Stretch with Foot on Ground - 2 x daily - 7 x weekly - 3-5 reps - 20 seconds hold Supine Figure 4 Piriformis Stretch - 2 x daily - 7 x weekly - 3-5 reps - 20 seconds hold Hooklying Clamshell with Resistance - 2 x daily - 7 x weekly - 2 sets - 10 reps - 3 seconds hold Prone Press Up - 2 x daily - 7 x weekly - 10 reps - 5-10 seconds hold

## 2021-10-29 NOTE — Progress Notes (Signed)
Office Visit Note   Patient: Melissa [name]           Date of Birth: 1951-02-27           MRN: [id number] Visit Date: 10/29/2021              Requested by: Marda Stalker, PA-C [place],  [place] [address] PCP: Marda Stalker, PA-C   Assessment & Plan: Visit Diagnoses:  1. Acute pain of right shoulder   2. Chronic left shoulder pain     Plan: She does have classic signs of impingement of both shoulders with tendinitis.  I did provide a steroid injection in both shoulders at the subacromial outlet after explaining the risk and benefits of steroid injections.  I then gave her a note for outpatient physical therapy did not include any modalities for both her shoulders as well as her back.  We will see her back in about 2 months to see how she is doing overall.  Follow-Up Instructions: Return in about 2 months (around 12/30/2021).   Orders:  Orders Placed This Encounter  Procedures   Large Joint Inj: R subacromial bursa   Large Joint Inj: L subacromial bursa   XR Shoulder Right   No orders of the defined types were placed in this encounter.     Procedures: Large Joint Inj: R subacromial bursa on 10/29/2021 9:10 AM Indications: pain and diagnostic evaluation Details: 22 G 1.5 in needle  Arthrogram: No  Medications: 3 mL lidocaine 1 %; 40 mg methylPREDNISolone acetate 40 MG/ML Outcome: tolerated well, no immediate complications Procedure, treatment alternatives, risks and benefits explained, specific risks discussed. Consent was given by the patient. Immediately prior to procedure a time out was called to verify the correct patient, procedure, equipment, support staff and site/side marked as required. Patient was prepped and draped in the usual sterile fashion.    Large Joint Inj: L subacromial bursa on 10/29/2021 9:11 AM Indications: pain and diagnostic evaluation Details: 22 G 1.5 in needle  Arthrogram: No  Medications: 3 mL lidocaine 1 %; 40 mg  methylPREDNISolone acetate 40 MG/ML Outcome: tolerated well, no immediate complications Procedure, treatment alternatives, risks and benefits explained, specific risks discussed. Consent was given by the patient. Immediately prior to procedure a time out was called to verify the correct patient, procedure, equipment, support staff and site/side marked as required. Patient was prepped and draped in the usual sterile fashion.      Clinical Data: No additional findings.   Subjective: Chief Complaint  Patient presents with   Left Shoulder - Follow-up, Pain   Right Shoulder - Pain  The patient's visit today was going to be to follow-up for her lumbar spine but she is only had one therapy visit.  She says that therapy visit has been helpful.  Her back is very stiff and deep tissue massage does help.  She does have known impingement in her left shoulder and was injected that before almost 3 months ago.  She started with both shoulders now.  It is mainly overhead activities and reaching behind her.  There is no known injury and no weakness in her shoulders she reports normal motion.  She is a prediabetic.  She reports good blood glucose control.  HPI  Review of Systems There is currently no fever, chills, nausea, vomiting  Objective: Vital Signs: There were no vitals taken for this visit.  Physical Exam She is alert and orient x3 and in no acute distress Ortho  Exam Both shoulders have full range of motion but shows signs of impingement.  Both shoulders have excellent strength. Specialty Comments:  No specialty comments available.  Imaging: XR Shoulder Right  Result Date: 10/29/2021 3 views of the right shoulder show no acute findings.    PMFS History: Patient Active Problem List   Diagnosis Date Noted   Status post total left knee replacement 06/21/2020   Status post total right knee replacement 03/08/2020   Pain in right foot 07/05/2017   Chronic pain of right knee 07/05/2017    Chronic pain of left knee 07/05/2017   Unilateral primary osteoarthritis, left knee 07/05/2017   Unilateral primary osteoarthritis, right knee 07/05/2017   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, right 04/14/2017   Past Medical History:  Diagnosis Date   Arthritis    osteo arthritis   Hypertension    Hypothyroidism    Pneumonia    Pre-diabetes    Takes metformin   Walking pneumonia     Family History  Problem Relation Age of Onset   Breast cancer Maternal Aunt        in 43's    Past Surgical History:  Procedure Laterality Date   JOINT REPLACEMENT     MENISCUS REPAIR     right knee   MULTIPLE TOOTH EXTRACTIONS     with implants   ovaries removed     2003   TONSILLECTOMY     TOTAL KNEE ARTHROPLASTY Right 03/08/2020   Procedure: RIGHT TOTAL KNEE ARTHROPLASTY AND LEFT KNEE STEROID INJECTION;  Surgeon: Mcarthur Rossetti, MD;  Location: [facility];  Service: Orthopedics;  Laterality: Right;   TOTAL KNEE ARTHROPLASTY Left 06/21/2020   Procedure: LEFT TOTAL KNEE ARTHROPLASTY;  Surgeon: Mcarthur Rossetti, MD;  Location: [facility];  Service: Orthopedics;  Laterality: Left;   Social History   Occupational History    Employer: [employer]  Tobacco Use   Smoking status: Former    Years: 10.00    Types: Cigarettes   Smokeless tobacco: Never   Tobacco comments:    quit when 70 years old  Vaping Use   Vaping Use: Never used  Substance and Sexual Activity   Alcohol use: Yes    Alcohol/week: 2.0 standard drinks    Types: 1 Glasses of wine, 1 Shots of liquor per week    Comment: daily   Drug use: Never   Sexual activity: Not Currently    Birth control/protection: None

## 2021-10-29 NOTE — Therapy (Signed)
[facility] Physical Therapy [address] [place], [place], [address] Phone: [phone]   Fax:  [phone]  Physical Therapy Treatment  Patient Details  Name: Melissa [name] MRN: [id number] Date of Birth: 09/02/1951 Referring Provider (PT): Jean Rosenthal MD   Encounter Date: 10/29/2021   PT End of Session - 10/29/21 0808     Visit Number 2    Number of Visits 13    PT Start Time 0800    PT Stop Time 1025    PT Time Calculation (min) 38 min    Activity Tolerance Patient tolerated treatment well    Behavior During Therapy [facility] for tasks assessed/performed             Past Medical History:  Diagnosis Date   Arthritis    osteo arthritis   Hypertension    Hypothyroidism    Pneumonia    Pre-diabetes    Takes metformin   Walking pneumonia     Past Surgical History:  Procedure Laterality Date   JOINT REPLACEMENT     MENISCUS REPAIR     right knee   MULTIPLE TOOTH EXTRACTIONS     with implants   ovaries removed     2003   TONSILLECTOMY     TOTAL KNEE ARTHROPLASTY Right 03/08/2020   Procedure: RIGHT TOTAL KNEE ARTHROPLASTY AND LEFT KNEE STEROID INJECTION;  Surgeon: Mcarthur Rossetti, MD;  Location: [facility];  Service: Orthopedics;  Laterality: Right;   TOTAL KNEE ARTHROPLASTY Left 06/21/2020   Procedure: LEFT TOTAL KNEE ARTHROPLASTY;  Surgeon: Mcarthur Rossetti, MD;  Location: [facility];  Service: Orthopedics;  Laterality: Left;    There were no vitals filed for this visit.   Subjective Assessment - 10/29/21 0807     Subjective Pt arriving today reporting sorness from the HEP.    Pertinent History arthritis, HTN, hypothyroidism, pneumonia, Pre diabetes  Rt TKA 03/08/2020, Lt TKA 06/21/2020    Limitations Walking;House hold activities;Sitting;Lifting    Diagnostic tests X-ray    Patient Stated Goals Get rid of pain    Currently in Pain? Yes    Pain Score 4     Pain Location Back    Pain Orientation Left    Pain Descriptors /  Indicators Aching    Pain Type Chronic pain    Pain Onset More than a month ago    Pain Frequency Constant                               [facility] Adult PT Treatment/Exercise - 10/29/21 0001       Exercises   Exercises Knee/Hip      Knee/Hip Exercises: Stretches   Active Hamstring Stretch Left;Right;2 reps    ITB Stretch Left;2 reps;20 seconds    Piriformis Stretch Limitations figure 4 pushing knee away x 2 each LE holding 20 seconds      Knee/Hip Exercises: Aerobic   Nustep L4 x 6 minutes      Knee/Hip Exercises: Seated   Ball Squeeze 2 x 10    Clamshell with TheraBand Green   2x10   Marching 20 reps    Marching Limitations with instructions for core activation      Knee/Hip Exercises: Prone   Other Prone Exercises prone on elbows x 1 minutes, prone press ups x 5 holding 10 seconds      Manual Therapy   Manual therapy comments IASTM to left lateral hip and gluteals  8 minutes                      PT Short Term Goals - 10/29/21 0825       PT SHORT TERM GOAL #1   Title Pt will be independent in initial HEP    Status On-going               PT Long Term Goals - 10/14/21 0918       PT LONG TERM GOAL #1   Title Pt will be independent in advanced HEP    Time 6    Period Weeks    Status New      PT LONG TERM GOAL #2   Title pt will improve FOTO to >/= 69  % function    Baseline 65% on 10/14/2021    Time 6    Period Weeks    Status New    Target Date 11/28/21      PT LONG TERM GOAL #3   Title pt will improve left  LE strength to 5/5 grossly to improve funcitonal mobility.    Time 6    Period Weeks    Status New    Target Date 11/28/21      PT LONG TERM GOAL #4   Title Pt will report pain in her low back and left side of </= 2/10 with ADL's.    Time 6    Period Weeks    Status New    Target Date 11/28/21      PT LONG TERM GOAL #5   Title Pt will improve endurance to ambulate for > or = to 30 minutes in the community  without limitations.    Time 6    Period Weeks    Status New    Target Date 11/28/21                   Plan - 10/29/21 0836     Clinical Impression Statement Pt arriving today reporting soreness following completion of her home exercises. Pt stating she feels fine while performing them but after her pain increases. Pt was instructed to decrease her reps and space each execise out to pin point if a specific exercise is causing pain. Also we reviewed pt's HEP today in clinic and pt was adivised against over stretching. Pt tolerating today's treatment well with added prone extension exercises to HEP. We discussed DN to TFL, gluteals and lumbar multifi, Consider at next visit. Continue skilled PT.    Personal Factors and Comorbidities Comorbidity 3+    Comorbidities arthritis, HTN, hypothyroidism, pneumonia, Pre diabetes  Rt TKA 03/08/2020, Lt TKA 06/21/2020    Examination-Activity Limitations Other;Squat;Stand;Carry    Examination-Participation Restrictions Community Activity;Other    Stability/Clinical Decision Making Stable/Uncomplicated    Rehab Potential Good    PT Frequency 2x / week    PT Duration 6 weeks    PT Treatment/Interventions ADLs/Self Care Home Management;Canalith Repostioning;Cryotherapy;Electrical Stimulation;Iontophoresis 4mg /ml Dexamethasone;Moist Heat;Traction;Ultrasound;Gait training;Stair training;Functional mobility training;Therapeutic activities;Patient/family education;Neuromuscular re-education;Balance training;Therapeutic exercise;Manual techniques;Passive range of motion;Dry needling;Taping;Spinal Manipulations    PT Next Visit Plan extension exercises, LE strengthening, Further assess for DN of lumbar multifidi.    PT Home Exercise Plan (8:37 AM) [facility])  Access Code: [email or web address]: [email or web address]: 11/22/2022Prepared by: Anderson Malta MartinExercises  Hooklying Single Knee to Chest Stretch - 2 x daily - 7 x weekly - 3-5  reps - 20 seconds holdSupine Lower Trunk Rotation - 2 x daily -  7 x weekly - 3-5 reps - 20 seconds holdSupine Posterior Pelvic Tilt - 2 x daily - 7 x weekly - 2 sets - 10 reps - 5 seconds holdSupine Piriformis Stretch with Foot on Ground - 2 x daily - 7 x weekly - 3-5 reps - 20 seconds holdSupine Figure 4 Piriformis Stretch - 2 x daily - 7 x weekly - 3-5 reps - 20 seconds hold  [place]  Patient Portal    (8:37 AM) [facility])  Can you print these out for me please    Consulted and Agree with Plan of Care Patient             Patient will benefit from skilled therapeutic intervention in order to improve the following deficits and impairments:  Pain, Postural dysfunction, Decreased activity tolerance, Difficulty walking, Decreased balance, Decreased endurance, Obesity, Decreased range of motion, Decreased strength, Decreased mobility  Visit Diagnosis: Chronic left-sided low back pain with left-sided sciatica  Muscle weakness (generalized)  Difficulty in walking, not elsewhere classified     Problem List Patient Active Problem List   Diagnosis Date Noted   Status post total left knee replacement 06/21/2020   Status post total right knee replacement 03/08/2020   Pain in right foot 07/05/2017   Chronic pain of right knee 07/05/2017   Chronic pain of left knee 07/05/2017   Unilateral primary osteoarthritis, left knee 07/05/2017   Unilateral primary osteoarthritis, right knee 07/05/2017   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, right 04/14/2017    Oretha Caprice, PT, MPT  10/29/2021, 8:59 AM  [facility] Physical Therapy [address] [place], [place], [address] Phone: [phone]   Fax:  [phone]  Name: Melissa [name] MRN: [id number] Date of Birth: [birth year]

## 2021-11-04 ENCOUNTER — Ambulatory Visit: Payer: Medicare HMO | Admitting: Rehabilitative and Restorative Service Providers"

## 2021-11-04 ENCOUNTER — Other Ambulatory Visit: Payer: Self-pay

## 2021-11-04 ENCOUNTER — Encounter: Payer: Self-pay | Admitting: Rehabilitative and Restorative Service Providers"

## 2021-11-04 DIAGNOSIS — M6281 Muscle weakness (generalized): Secondary | ICD-10-CM

## 2021-11-04 DIAGNOSIS — M5442 Lumbago with sciatica, left side: Secondary | ICD-10-CM

## 2021-11-04 DIAGNOSIS — R262 Difficulty in walking, not elsewhere classified: Secondary | ICD-10-CM

## 2021-11-04 DIAGNOSIS — G8929 Other chronic pain: Secondary | ICD-10-CM | POA: Diagnosis not present

## 2021-11-04 NOTE — Therapy (Signed)
[facility] Physical Therapy [address] [place], [place], [address] Phone: [phone]   Fax:  [phone]  Physical Therapy Treatment  Patient Details  Name: Melissa [name] MRN: [id number] Date of Birth: 22-Aug-1951 Referring Provider (PT): Jean Rosenthal MD   Encounter Date: 11/04/2021   PT End of Session - 11/04/21 1621     Visit Number 3    Number of Visits 13    Date for PT Re-Evaluation 11/28/21    PT Start Time 4481    PT Stop Time 1637    PT Time Calculation (min) 40 min    Activity Tolerance Patient tolerated treatment well    Behavior During Therapy [facility] for tasks assessed/performed             Past Medical History:  Diagnosis Date   Arthritis    osteo arthritis   Hypertension    Hypothyroidism    Pneumonia    Pre-diabetes    Takes metformin   Walking pneumonia     Past Surgical History:  Procedure Laterality Date   JOINT REPLACEMENT     MENISCUS REPAIR     right knee   MULTIPLE TOOTH EXTRACTIONS     with implants   ovaries removed     2003   TONSILLECTOMY     TOTAL KNEE ARTHROPLASTY Right 03/08/2020   Procedure: RIGHT TOTAL KNEE ARTHROPLASTY AND LEFT KNEE STEROID INJECTION;  Surgeon: Mcarthur Rossetti, MD;  [facility]: [facility];  Service: Orthopedics;  Laterality: Right;   TOTAL KNEE ARTHROPLASTY Left 06/21/2020   Procedure: LEFT TOTAL KNEE ARTHROPLASTY;  Surgeon: Mcarthur Rossetti, MD;  Location: [facility];  Service: Orthopedics;  Laterality: Left;    There were no vitals filed for this visit.   Subjective Assessment - 11/04/21 1601     Subjective Pt. stated having complaints in side/hip this morning around 5/10 or so.  Pt. stated less now.  Feeling some improvement since last visit.    Pertinent History arthritis, HTN, hypothyroidism, pneumonia, Pre diabetes  Rt TKA 03/08/2020, Lt TKA 06/21/2020    Limitations Walking;House hold activities;Sitting;Lifting    Diagnostic tests X-ray    Patient Stated Goals Get  rid of pain    Pain Score 3     Pain Location Hip    Pain Orientation Left    Pain Descriptors / Indicators Aching;Tightness    Pain Type Chronic pain    Pain Onset More than a month ago    Pain Frequency Intermittent    Aggravating Factors  this morning symptoms, no activity specified.    Pain Relieving Factors last visit helped.                               [place] Adult PT Treatment/Exercise - 11/04/21 0001       Knee/Hip Exercises: Stretches   ITB Stretch 3 reps;20 seconds;Both    Piriformis Stretch Limitations figure 4 pushing knee away x 3 each LE holding 20 seconds      Knee/Hip Exercises: Aerobic   Nustep Lvl 5 10 mins UE/LE      Knee/Hip Exercises: Sidelying   Clams Lt clam shell in Rt sidelying 3 x 10      Manual Therapy   Manual therapy comments compression to Lt glute med, palpation during dry needling              Trigger Point Dry Needling - 11/04/21 0001     Consent  Given? Yes    Education Handout Provided Previously provided    Muscles Treated Back/Hip Gluteus medius   Lt   Gluteus Medius Response Twitch response elicited                   PT Education - 11/04/21 1611     Education Details DN review ( pt. indicated paper given last visit)    Person(s) Educated Patient    Methods Explanation    Comprehension Verbalized understanding              PT Short Term Goals - 10/29/21 0825       PT SHORT TERM GOAL #1   Title Pt will be independent in initial HEP    Status On-going               PT Long Term Goals - 10/14/21 0918       PT LONG TERM GOAL #1   Title Pt will be independent in advanced HEP    Time 6    Period Weeks    Status New      PT LONG TERM GOAL #2   Title pt will improve FOTO to >/= 69  % function    Baseline 65% on 10/14/2021    Time 6    Period Weeks    Status New    Target Date 11/28/21      PT LONG TERM GOAL #3   Title pt will improve left  LE strength to 5/5 grossly to  improve funcitonal mobility.    Time 6    Period Weeks    Status New    Target Date 11/28/21      PT LONG TERM GOAL #4   Title Pt will report pain in her low back and left side of </= 2/10 with ADL's.    Time 6    Period Weeks    Status New    Target Date 11/28/21      PT LONG TERM GOAL #5   Title Pt will improve endurance to ambulate for > or = to 30 minutes in the community without limitations.    Time 6    Period Weeks    Status New    Target Date 11/28/21                   Plan - 11/04/21 1639     Clinical Impression Statement Pt. desired use of DN today.  Education provided for procedure as well as after care for HEP.  Localized symptoms noted in Lt lateral hip.  Good tolerance and not abnormal response noted.  Pt. to benefit from continued skilled PT aimed at mobility gains and strengthening.    Personal Factors and Comorbidities Comorbidity 3+    Comorbidities arthritis, HTN, hypothyroidism, pneumonia, Pre diabetes  Rt TKA 03/08/2020, Lt TKA 06/21/2020    Examination-Activity Limitations Other;Squat;Stand;Carry    Examination-Participation Restrictions Community Activity;Other    Stability/Clinical Decision Making Stable/Uncomplicated    Rehab Potential Good    PT Frequency 2x / week    PT Duration 6 weeks    PT Treatment/Interventions ADLs/Self Care Home Management;Canalith Repostioning;Cryotherapy;Electrical Stimulation;Iontophoresis 4mg /ml Dexamethasone;Moist Heat;Traction;Ultrasound;Gait training;Stair training;Functional mobility training;Therapeutic activities;Patient/family education;Neuromuscular re-education;Balance training;Therapeutic exercise;Manual techniques;Passive range of motion;Dry needling;Taping;Spinal Manipulations    PT Next Visit Plan extension exercises, LE strengthening, Further assess for DN of lumbar multifidi.    PT Home Exercise Plan [email or web address]    Consulted and Agree with Plan of Care Patient  Patient will benefit from  skilled therapeutic intervention in order to improve the following deficits and impairments:  Pain, Postural dysfunction, Decreased activity tolerance, Difficulty walking, Decreased balance, Decreased endurance, Obesity, Decreased range of motion, Decreased strength, Decreased mobility  Visit Diagnosis: Chronic left-sided low back pain with left-sided sciatica  Muscle weakness (generalized)  Difficulty in walking, not elsewhere classified     Problem List Patient Active Problem List   Diagnosis Date Noted   Status post total left knee replacement 06/21/2020   Status post total right knee replacement 03/08/2020   Pain in right foot 07/05/2017   Chronic pain of right knee 07/05/2017   Chronic pain of left knee 07/05/2017   Unilateral primary osteoarthritis, left knee 07/05/2017   Unilateral primary osteoarthritis, right knee 07/05/2017   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, right 04/14/2017   Scot Jun, PT, DPT, OCS, ATC 11/04/21  4:41 PM    [facility] [address] [place], [place], [address] Phone: [phone]   Fax:  [phone]  Name: RAYONNA HELDMAN MRN: [id number] Date of Birth: [birth year]

## 2021-11-07 ENCOUNTER — Other Ambulatory Visit: Payer: Self-pay

## 2021-11-07 ENCOUNTER — Ambulatory Visit: Payer: Medicare HMO | Admitting: Physical Therapy

## 2021-11-07 ENCOUNTER — Encounter: Payer: Self-pay | Admitting: Physical Therapy

## 2021-11-07 DIAGNOSIS — R262 Difficulty in walking, not elsewhere classified: Secondary | ICD-10-CM

## 2021-11-07 DIAGNOSIS — M5442 Lumbago with sciatica, left side: Secondary | ICD-10-CM | POA: Diagnosis not present

## 2021-11-07 DIAGNOSIS — M6281 Muscle weakness (generalized): Secondary | ICD-10-CM

## 2021-11-07 DIAGNOSIS — G8929 Other chronic pain: Secondary | ICD-10-CM

## 2021-11-07 NOTE — Patient Instructions (Signed)
Access Code: [email or web address] URL: [email or web address] Date: 11/07/2021 Prepared by: Faustino Congress  Exercises Hooklying Single Knee to Chest Stretch - 2 x daily - 7 x weekly - 3-5 reps - 20 seconds hold Supine Lower Trunk Rotation - 2 x daily - 7 x weekly - 3-5 reps - 20 seconds hold Supine Posterior Pelvic Tilt - 2 x daily - 7 x weekly - 2 sets - 10 reps - 5 seconds hold Supine Piriformis Stretch with Foot on Ground - 2 x daily - 7 x weekly - 3-5 reps - 20 seconds hold Supine Figure 4 Piriformis Stretch - 2 x daily - 7 x weekly - 3-5 reps - 20 seconds hold Hooklying Clamshell with Resistance - 2 x daily - 7 x weekly - 2 sets - 10 reps - 3 seconds hold Prone Press Up - 2 x daily - 7 x weekly - 10 reps - 5-10 seconds hold Standing Hip Extension with Counter Support - 1 x daily - 7 x weekly - 1 sets - 20 reps

## 2021-11-07 NOTE — Therapy (Signed)
[facility] Physical Therapy [address] [place], [place], [address] Phone: [phone]   Fax:  [phone]  Physical Therapy Treatment  Patient Details  Name: Melissa [name] MRN: [id number] Date of Birth: 01-Jul-1951 Referring Provider (PT): Jean Rosenthal MD   Encounter Date: 11/07/2021   PT End of Session - 11/07/21 0930     Visit Number 4    Number of Visits 13    Date for PT Re-Evaluation 11/28/21    Progress Note Due on Visit 10    PT Start Time 0845    PT Stop Time 0925    PT Time Calculation (min) 40 min    Activity Tolerance Patient tolerated treatment well    Behavior During Therapy [facility] for tasks assessed/performed             Past Medical History:  Diagnosis Date   Arthritis    osteo arthritis   Hypertension    Hypothyroidism    Pneumonia    Pre-diabetes    Takes metformin   Walking pneumonia     Past Surgical History:  Procedure Laterality Date   JOINT REPLACEMENT     MENISCUS REPAIR     right knee   MULTIPLE TOOTH EXTRACTIONS     with implants   ovaries removed     2003   TONSILLECTOMY     TOTAL KNEE ARTHROPLASTY Right 03/08/2020   Procedure: RIGHT TOTAL KNEE ARTHROPLASTY AND LEFT KNEE STEROID INJECTION;  Surgeon: Mcarthur Rossetti, MD;  [facility]: [facility];  Service: Orthopedics;  Laterality: Right;   TOTAL KNEE ARTHROPLASTY Left 06/21/2020   Procedure: LEFT TOTAL KNEE ARTHROPLASTY;  Surgeon: Mcarthur Rossetti, MD;  Location: [facility];  Service: Orthopedics;  Laterality: Left;    There were no vitals filed for this visit.   Subjective Assessment - 11/07/21 0848     Subjective once soreness seemed to resolve she feels needling was helpful.  thinks the massage was helpful.  overall does feel better.    Pertinent History arthritis, HTN, hypothyroidism, pneumonia, Pre diabetes  Rt TKA 03/08/2020, Lt TKA 06/21/2020    Limitations Walking;House hold activities;Sitting;Lifting    Diagnostic tests X-ray     Patient Stated Goals Get rid of pain    Currently in Pain? No/denies                               [facility] Adult PT Treatment/Exercise - 11/07/21 0847       Knee/Hip Exercises: Stretches   Piriformis Stretch Left;3 reps;20 seconds      Knee/Hip Exercises: Aerobic   Nustep L6 x 8 min      Knee/Hip Exercises: Standing   Hip Extension Both;1 set;10 reps    Other Standing Knee Exercises lumbar extension 10 x 5 sec hold      Knee/Hip Exercises: Supine   Bridges Both;10 reps    Bridges Limitations with isometric clamshell with strap    Other Supine Knee/Hip Exercises isometric clamshells with belt 10 x 5 sec hold      Manual Therapy   Manual therapy comments compression to Lt glutes and priformis, palpation during dry needling              Trigger Point Dry Needling - 11/07/21 0907     Consent Given? Yes    Education Handout Provided Previously provided    Muscles Treated Back/Hip Piriformis    Piriformis Response Twitch response elicited  PT Education - 11/07/21 0929     Education Details added standing hip extension to HEP    Person(s) Educated Patient    Methods Explanation;Demonstration;Handout    Comprehension Verbalized understanding;Returned demonstration;Need further instruction              PT Short Term Goals - 11/07/21 0930       PT SHORT TERM GOAL #1   Title Pt will be independent in initial HEP    Status Achieved               PT Long Term Goals - 11/07/21 0930       PT LONG TERM GOAL #1   Title Pt will be independent in advanced HEP    Time 6    Period Weeks    Status On-going    Target Date 11/28/21      PT LONG TERM GOAL #2   Title pt will improve FOTO to >/= 69  % function    Baseline 65% on 10/14/2021    Time 6    Period Weeks    Status On-going    Target Date 11/28/21      PT LONG TERM GOAL #3   Title pt will improve left  LE strength to 5/5 grossly to improve funcitonal  mobility.    Time 6    Period Weeks    Status On-going    Target Date 11/28/21      PT LONG TERM GOAL #4   Title Pt will report pain in her low back and left side of </= 2/10 with ADL's.    Time 6    Period Weeks    Status On-going    Target Date 11/28/21      PT LONG TERM GOAL #5   Title Pt will improve endurance to ambulate for > or = to 30 minutes in the community without limitations.    Time 6    Period Weeks    Status On-going    Target Date 11/28/21                   Plan - 11/07/21 0930     Clinical Impression Statement Pt is compliant with current HEP meeting STG at this time.  Utilized DN and manual therapy today to Lt glutes and piriformis with positive response following session.  She reported decreased tightness with piriformis stretch.  Will continue to benefit from PT to maximize function.    Personal Factors and Comorbidities Comorbidity 3+    Comorbidities arthritis, HTN, hypothyroidism, pneumonia, Pre diabetes  Rt TKA 03/08/2020, Lt TKA 06/21/2020    Examination-Activity Limitations Other;Squat;Stand;Carry    Examination-Participation Restrictions Community Activity;Other    Stability/Clinical Decision Making Stable/Uncomplicated    Rehab Potential Good    PT Frequency 2x / week    PT Duration 6 weeks    PT Treatment/Interventions ADLs/Self Care Home Management;Canalith Repostioning;Cryotherapy;Electrical Stimulation;Iontophoresis 4mg /ml Dexamethasone;Moist Heat;Traction;Ultrasound;Gait training;Stair training;Functional mobility training;Therapeutic activities;Patient/family education;Neuromuscular re-education;Balance training;Therapeutic exercise;Manual techniques;Passive range of motion;Dry needling;Taping;Spinal Manipulations    PT Next Visit Plan extension exercises, LE strengthening, assess response to DN of piriformis - may need multifidi; continue hip/core strengthening    PT Home Exercise Plan Access Code: [email or web address]    Consulted and Agree with Plan  of Care Patient             Patient will benefit from skilled therapeutic intervention in order to improve the following deficits and impairments:  Pain, Postural dysfunction, Decreased activity tolerance,  Difficulty walking, Decreased balance, Decreased endurance, Obesity, Decreased range of motion, Decreased strength, Decreased mobility  Visit Diagnosis: Chronic left-sided low back pain with left-sided sciatica  Muscle weakness (generalized)  Difficulty in walking, not elsewhere classified     Problem List Patient Active Problem List   Diagnosis Date Noted   Status post total left knee replacement 06/21/2020   Status post total right knee replacement 03/08/2020   Pain in right foot 07/05/2017   Chronic pain of right knee 07/05/2017   Chronic pain of left knee 07/05/2017   Unilateral primary osteoarthritis, left knee 07/05/2017   Unilateral primary osteoarthritis, right knee 07/05/2017   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, right 04/14/2017      Laureen Abrahams, PT, DPT 11/07/21 9:33 AM     [facility] Physical Therapy [address] [place], [place], [address] Phone: [phone]   Fax:  [phone]  Name: Melissa [name] MRN: [id number] Date of Birth: 1951/03/23

## 2021-11-11 ENCOUNTER — Encounter: Payer: Self-pay | Admitting: Rehabilitative and Restorative Service Providers"

## 2021-11-11 ENCOUNTER — Other Ambulatory Visit: Payer: Self-pay

## 2021-11-11 ENCOUNTER — Ambulatory Visit: Payer: Medicare HMO | Admitting: Rehabilitative and Restorative Service Providers"

## 2021-11-11 DIAGNOSIS — M6281 Muscle weakness (generalized): Secondary | ICD-10-CM

## 2021-11-11 DIAGNOSIS — M5442 Lumbago with sciatica, left side: Secondary | ICD-10-CM | POA: Diagnosis not present

## 2021-11-11 DIAGNOSIS — R262 Difficulty in walking, not elsewhere classified: Secondary | ICD-10-CM

## 2021-11-11 DIAGNOSIS — G8929 Other chronic pain: Secondary | ICD-10-CM | POA: Diagnosis not present

## 2021-11-11 NOTE — Therapy (Signed)
[facility] [address] [place], [place], [address] Phone: [phone]   Fax:  [phone]  Physical Therapy Treatment  Patient Details  Name: Melissa [name] MRN: [id number] Date of Birth: 1951-02-19 Referring Provider (PT): Jean Rosenthal MD   Encounter Date: 11/11/2021   PT End of Session - 11/11/21 1550     Visit Number 5    Number of Visits 13    Date for PT Re-Evaluation 11/28/21    Progress Note Due on Visit 10    PT Start Time 1549    PT Stop Time 1629    PT Time Calculation (min) 40 min    Activity Tolerance Patient tolerated treatment well    Behavior During Therapy [facility] for tasks assessed/performed             Past Medical History:  Diagnosis Date   Arthritis    osteo arthritis   Hypertension    Hypothyroidism    Pneumonia    Pre-diabetes    Takes metformin   Walking pneumonia     Past Surgical History:  Procedure Laterality Date   JOINT REPLACEMENT     MENISCUS REPAIR     right knee   MULTIPLE TOOTH EXTRACTIONS     with implants   ovaries removed     2003   TONSILLECTOMY     TOTAL KNEE ARTHROPLASTY Right 03/08/2020   Procedure: RIGHT TOTAL KNEE ARTHROPLASTY AND LEFT KNEE STEROID INJECTION;  Surgeon: Mcarthur Rossetti, MD;  Location: [facility];  Service: Orthopedics;  Laterality: Right;   TOTAL KNEE ARTHROPLASTY Left 06/21/2020   Procedure: LEFT TOTAL KNEE ARTHROPLASTY;  Surgeon: Mcarthur Rossetti, MD;  Location: [facility];  Service: Orthopedics;  Laterality: Left;    There were no vitals filed for this visit.   Subjective Assessment - 11/11/21 1612     Subjective Pt. indicated main complaint of Lt lateral lumbar symptoms, noted more this morning at 6/10, arrival today at 3/10.  Hip had no complaints today.    Pertinent History arthritis, HTN, hypothyroidism, pneumonia, Pre diabetes  Rt TKA 03/08/2020, Lt TKA 06/21/2020    Limitations Walking;House hold activities;Sitting;Lifting    Diagnostic  tests X-ray    Patient Stated Goals Get rid of pain    Currently in Pain? Yes    Pain Score 3     Pain Location Back    Pain Orientation Left;Lower    Pain Type Chronic pain    Pain Onset More than a month ago    Pain Frequency Intermittent    Aggravating Factors  worse this morning, unsure.  bending to Lt    Pain Relieving Factors passage of time, advil.                [facility] PT Assessment - 11/11/21 0001       Assessment   Medical Diagnosis M54.42 left sided low back pain with left sciatica    Referring Provider (PT) Jean Rosenthal MD      AROM   AROM Assessment Site Lumbar    Lumbar Flexion movement to floor no complaints    Lumbar Extension 100% WFL no complaint    Lumbar - Right Side Bend movement to head of fibula    Lumbar - Left Side Bend movement to head of fibula, Lt lumbar pain                           [facility] Adult PT Treatment/Exercise -  11/11/21 0001       Knee/Hip Exercises: Stretches   Other Knee/Hip Stretches lumbar trunk rotation in supine 15 sec x 3 bilateral, Rt sidelying regional rotation lumbar stretch 15 sec x 3      Knee/Hip Exercises: Aerobic   Nustep Lvl 6 10 mins UE/LE      Manual Therapy   Manual therapy comments skilled palpation during dry needling. compression/STM to Lt QL              Trigger Point Dry Needling - 11/11/21 0001     Consent Given? Yes    Education Handout Provided Previously provided    Muscles Treated Back/Hip Quadratus lumborum   Lt, performed in Rt sidelying   Quadratus Lumborum Response Twitch response elicited                     PT Short Term Goals - 11/07/21 0930       PT SHORT TERM GOAL #1   Title Pt will be independent in initial HEP    Status Achieved               PT Long Term Goals - 11/07/21 0930       PT LONG TERM GOAL #1   Title Pt will be independent in advanced HEP    Time 6    Period Weeks    Status On-going    Target Date 11/28/21       PT LONG TERM GOAL #2   Title pt will improve FOTO to >/= 69  % function    Baseline 65% on 10/14/2021    Time 6    Period Weeks    Status On-going    Target Date 11/28/21      PT LONG TERM GOAL #3   Title pt will improve left  LE strength to 5/5 grossly to improve funcitonal mobility.    Time 6    Period Weeks    Status On-going    Target Date 11/28/21      PT LONG TERM GOAL #4   Title Pt will report pain in her low back and left side of </= 2/10 with ADL's.    Time 6    Period Weeks    Status On-going    Target Date 11/28/21      PT LONG TERM GOAL #5   Title Pt will improve endurance to ambulate for > or = to 30 minutes in the community without limitations.    Time 6    Period Weeks    Status On-going    Target Date 11/28/21                   Plan - 11/11/21 1617     Clinical Impression Statement Benefits for localized hip symptoms so far noted.  Chief complaint of Lt lumbar laterally produced c palpation to area and concordant symptoms noted c twitch of Lt QL.  Treated accordingly today c manual and DN with improvement in lateral flexion post treatment.  Additional rotation stretch for QL reviewed today.    Personal Factors and Comorbidities Comorbidity 3+    Comorbidities arthritis, HTN, hypothyroidism, pneumonia, Pre diabetes  Rt TKA 03/08/2020, Lt TKA 06/21/2020    Examination-Activity Limitations Other;Squat;Stand;Carry    Examination-Participation Restrictions Community Activity;Other    Stability/Clinical Decision Making Stable/Uncomplicated    Rehab Potential Good    PT Frequency 2x / week    PT Duration 6 weeks  PT Treatment/Interventions ADLs/Self Care Home Management;Canalith Repostioning;Cryotherapy;Electrical Stimulation;Iontophoresis 4mg /ml Dexamethasone;Moist Heat;Traction;Ultrasound;Gait training;Stair training;Functional mobility training;Therapeutic activities;Patient/family education;Neuromuscular re-education;Balance training;Therapeutic  exercise;Manual techniques;Passive range of motion;Dry needling;Taping;Spinal Manipulations    PT Next Visit Plan Review results from Lt QL needling.    PT Home Exercise Plan Access Code: [email or web address]    Consulted and Agree with Plan of Care Patient             Patient will benefit from skilled therapeutic intervention in order to improve the following deficits and impairments:  Pain, Postural dysfunction, Decreased activity tolerance, Difficulty walking, Decreased balance, Decreased endurance, Obesity, Decreased range of motion, Decreased strength, Decreased mobility  Visit Diagnosis: Chronic left-sided low back pain with left-sided sciatica  Muscle weakness (generalized)  Difficulty in walking, not elsewhere classified     Problem List Patient Active Problem List   Diagnosis Date Noted   Status post total left knee replacement 06/21/2020   Status post total right knee replacement 03/08/2020   Pain in right foot 07/05/2017   Chronic pain of right knee 07/05/2017   Chronic pain of left knee 07/05/2017   Unilateral primary osteoarthritis, left knee 07/05/2017   Unilateral primary osteoarthritis, right knee 07/05/2017   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, right 04/14/2017    Scot Jun, PT, DPT, OCS, ATC 11/11/21  4:24 PM    [facility] [address] [place], [place], [address] Phone: [phone]   Fax:  [phone]  Name: Melissa [name] MRN: [id number] Date of Birth: 1951/01/28

## 2021-11-13 ENCOUNTER — Ambulatory Visit: Payer: Medicare HMO | Admitting: Rehabilitative and Restorative Service Providers"

## 2021-11-13 ENCOUNTER — Encounter: Payer: Self-pay | Admitting: Rehabilitative and Restorative Service Providers"

## 2021-11-13 ENCOUNTER — Other Ambulatory Visit: Payer: Self-pay

## 2021-11-13 DIAGNOSIS — M6281 Muscle weakness (generalized): Secondary | ICD-10-CM

## 2021-11-13 DIAGNOSIS — M5442 Lumbago with sciatica, left side: Secondary | ICD-10-CM

## 2021-11-13 DIAGNOSIS — R262 Difficulty in walking, not elsewhere classified: Secondary | ICD-10-CM | POA: Diagnosis not present

## 2021-11-13 DIAGNOSIS — G8929 Other chronic pain: Secondary | ICD-10-CM | POA: Diagnosis not present

## 2021-11-13 NOTE — Therapy (Signed)
[facility] [address] [place], [place], [address] Phone: [phone]   Fax:  [phone]  Physical Therapy Treatment  Patient Details  Name: Melissa [name] MRN: [id number] Date of Birth: 1951-05-27 Referring Provider (PT): Jean Rosenthal MD   Encounter Date: 11/13/2021   PT End of Session - 11/13/21 1630     Visit Number 6    Number of Visits 13    Date for PT Re-Evaluation 11/28/21    Progress Note Due on Visit 10    PT Start Time 1558    PT Stop Time 1628    PT Time Calculation (min) 30 min    Activity Tolerance Patient tolerated treatment well    Behavior During Therapy [facility] for tasks assessed/performed             Past Medical History:  Diagnosis Date   Arthritis    osteo arthritis   Hypertension    Hypothyroidism    Pneumonia    Pre-diabetes    Takes metformin   Walking pneumonia     Past Surgical History:  Procedure Laterality Date   JOINT REPLACEMENT     MENISCUS REPAIR     right knee   MULTIPLE TOOTH EXTRACTIONS     with implants   ovaries removed     2003   TONSILLECTOMY     TOTAL KNEE ARTHROPLASTY Right 03/08/2020   Procedure: RIGHT TOTAL KNEE ARTHROPLASTY AND LEFT KNEE STEROID INJECTION;  Surgeon: Mcarthur Rossetti, MD;  Location: [facility];  Service: Orthopedics;  Laterality: Right;   TOTAL KNEE ARTHROPLASTY Left 06/21/2020   Procedure: LEFT TOTAL KNEE ARTHROPLASTY;  Surgeon: Mcarthur Rossetti, MD;  Location: [facility];  Service: Orthopedics;  Laterality: Left;    There were no vitals filed for this visit.   Subjective Assessment - 11/13/21 1603     Subjective Pt. indicated complaints in Lt lumbar region at 2/10.    Pertinent History arthritis, HTN, hypothyroidism, pneumonia, Pre diabetes  Rt TKA 03/08/2020, Lt TKA 06/21/2020    Limitations Walking;House hold activities;Sitting;Lifting    Diagnostic tests X-ray    Patient Stated Goals Get rid of pain    Currently in Pain? No/denies     Pain Score 0-No pain    Pain Orientation Left;Lower    Pain Descriptors / Indicators Aching;Tightness    Pain Type Chronic pain    Pain Onset More than a month ago    Pain Frequency Intermittent    Aggravating Factors  no specific things reported    Pain Relieving Factors treatment                               [facility] Adult PT Treatment/Exercise - 11/13/21 0001       Modalities   Modalities Electrical Stimulation      Electrical Stimulation   Electrical Stimulation Location Lt/central lumbar    Electrical Stimulation Action IFC    Electrical Stimulation Parameters 10 mins to tolerance    Electrical Stimulation Goals Pain      Manual Therapy   Manual therapy comments skilled palpation during dry needling. compression/STM to Lt QL              Trigger Point Dry Needling - 11/13/21 0001     Consent Given? Yes    Education Handout Provided Previously provided    Muscles Treated Back/Hip Lumbar multifidi   Lumbar paraspinals Lt   Lumbar multifidi Response Twitch  response elicited                     PT Short Term Goals - 11/07/21 0930       PT SHORT TERM GOAL #1   Title Pt will be independent in initial HEP    Status Achieved               PT Long Term Goals - 11/07/21 0930       PT LONG TERM GOAL #1   Title Pt will be independent in advanced HEP    Time 6    Period Weeks    Status On-going    Target Date 11/28/21      PT LONG TERM GOAL #2   Title pt will improve FOTO to >/= 69  % function    Baseline 65% on 10/14/2021    Time 6    Period Weeks    Status On-going    Target Date 11/28/21      PT LONG TERM GOAL #3   Title pt will improve left  LE strength to 5/5 grossly to improve funcitonal mobility.    Time 6    Period Weeks    Status On-going    Target Date 11/28/21      PT LONG TERM GOAL #4   Title Pt will report pain in her low back and left side of </= 2/10 with ADL's.    Time 6    Period Weeks    Status  On-going    Target Date 11/28/21      PT LONG TERM GOAL #5   Title Pt will improve endurance to ambulate for > or = to 30 minutes in the community without limitations.    Time 6    Period Weeks    Status On-going    Target Date 11/28/21                   Plan - 11/13/21 1625     Clinical Impression Statement Pt. continued to report and show improvement in areas treated specifically c dry needling.  Each muscle area treated today has shown improvement.  Today's area was centered primarily on Lt lower lumbar paraspinals and multifidi and good tolerance noted.  use of estim as well today to promote reduced post treatment soreness and reduced muscle guarding in area.  Continued skilled PT services indicated at this time.    Personal Factors and Comorbidities Comorbidity 3+    Comorbidities arthritis, HTN, hypothyroidism, pneumonia, Pre diabetes  Rt TKA 03/08/2020, Lt TKA 06/21/2020    Examination-Activity Limitations Other;Squat;Stand;Carry    Examination-Participation Restrictions Community Activity;Other    Stability/Clinical Decision Making Stable/Uncomplicated    Rehab Potential Good    PT Frequency 2x / week    PT Duration 6 weeks    PT Treatment/Interventions ADLs/Self Care Home Management;Canalith Repostioning;Cryotherapy;Electrical Stimulation;Iontophoresis 4mg /ml Dexamethasone;Moist Heat;Traction;Ultrasound;Gait training;Stair training;Functional mobility training;Therapeutic activities;Patient/family education;Neuromuscular re-education;Balance training;Therapeutic exercise;Manual techniques;Passive range of motion;Dry needling;Taping;Spinal Manipulations    PT Next Visit Plan DN as desired, FOTO reassessment possible.    PT Home Exercise Plan Access Code: [email or web address]    Consulted and Agree with Plan of Care Patient             Patient will benefit from skilled therapeutic intervention in order to improve the following deficits and impairments:  Pain, Postural dysfunction,  Decreased activity tolerance, Difficulty walking, Decreased balance, Decreased endurance, Obesity, Decreased range of motion, Decreased strength, Decreased mobility  Visit  Diagnosis: Chronic left-sided low back pain with left-sided sciatica  Muscle weakness (generalized)  Difficulty in walking, not elsewhere classified     Problem List Patient Active Problem List   Diagnosis Date Noted   Status post total left knee replacement 06/21/2020   Status post total right knee replacement 03/08/2020   Pain in right foot 07/05/2017   Chronic pain of right knee 07/05/2017   Chronic pain of left knee 07/05/2017   Unilateral primary osteoarthritis, left knee 07/05/2017   Unilateral primary osteoarthritis, right knee 07/05/2017   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, right 04/14/2017    Scot Jun, PT, DPT, OCS, ATC 11/13/21  4:31 PM    [facility] [address] [place], [place], [address] Phone: [phone]   Fax:  [phone]  Name: Melissa [name] MRN: [id number] Date of Birth: [birth year]

## 2021-11-19 ENCOUNTER — Ambulatory Visit: Payer: Medicare HMO | Admitting: Rehabilitative and Restorative Service Providers"

## 2021-11-19 ENCOUNTER — Encounter: Payer: Self-pay | Admitting: Rehabilitative and Restorative Service Providers"

## 2021-11-19 ENCOUNTER — Other Ambulatory Visit: Payer: Self-pay

## 2021-11-19 DIAGNOSIS — G8929 Other chronic pain: Secondary | ICD-10-CM

## 2021-11-19 DIAGNOSIS — M6281 Muscle weakness (generalized): Secondary | ICD-10-CM

## 2021-11-19 DIAGNOSIS — M5442 Lumbago with sciatica, left side: Secondary | ICD-10-CM

## 2021-11-19 DIAGNOSIS — R262 Difficulty in walking, not elsewhere classified: Secondary | ICD-10-CM

## 2021-11-19 NOTE — Therapy (Signed)
[facility] [address] [place], [place], [address] Phone: [phone]   Fax:  [phone]  Physical Therapy Treatment  Patient Details  Name: Melissa [name] MRN: [id number] Date of Birth: 10-Jan-1951 Referring Provider (PT): Jean Rosenthal MD   Encounter Date: 11/19/2021   PT End of Session - 11/19/21 1626     Visit Number 7    Number of Visits 13    Date for PT Re-Evaluation 11/28/21    Progress Note Due on Visit 10    PT Start Time 1559    PT Stop Time 3235    PT Time Calculation (min) 36 min    Activity Tolerance Patient tolerated treatment well    Behavior During Therapy [facility] for tasks assessed/performed             Past Medical History:  Diagnosis Date   Arthritis    osteo arthritis   Hypertension    Hypothyroidism    Pneumonia    Pre-diabetes    Takes metformin   Walking pneumonia     Past Surgical History:  Procedure Laterality Date   JOINT REPLACEMENT     MENISCUS REPAIR     right knee   MULTIPLE TOOTH EXTRACTIONS     with implants   ovaries removed     2003   TONSILLECTOMY     TOTAL KNEE ARTHROPLASTY Right 03/08/2020   Procedure: RIGHT TOTAL KNEE ARTHROPLASTY AND LEFT KNEE STEROID INJECTION;  Surgeon: Mcarthur Rossetti, MD;  Location: [facility];  Service: Orthopedics;  Laterality: Right;   TOTAL KNEE ARTHROPLASTY Left 06/21/2020   Procedure: LEFT TOTAL KNEE ARTHROPLASTY;  Surgeon: Mcarthur Rossetti, MD;  Location: [facility];  Service: Orthopedics;  Laterality: Left;    There were no vitals filed for this visit.   Subjective Assessment - 11/19/21 1603     Subjective Pt. indicated feeling ok over the weekend.  Pt. indicated soreness today increased in that same lower back region.  Also reported Rt arm movement is getting hard.    Pertinent History arthritis, HTN, hypothyroidism, pneumonia, Pre diabetes  Rt TKA 03/08/2020, Lt TKA 06/21/2020    Limitations Walking;House hold  activities;Sitting;Lifting    Diagnostic tests X-ray    Patient Stated Goals Get rid of pain    Currently in Pain? Yes    Pain Score 4     Pain Location Back    Pain Orientation Left;Lower    Pain Descriptors / Indicators Sore    Pain Type Chronic pain    Pain Onset More than a month ago    Pain Frequency Intermittent    Aggravating Factors  walking today hurts    Pain Relieving Factors felt like needling helped last visit some                [facility] PT Assessment - 11/19/21 0001       Assessment   Medical Diagnosis M54.42 left sided low back pain with left sciatica    Referring Provider (PT) Jean Rosenthal MD      Observation/Other Assessments   Focus on Therapeutic Outcomes (FOTO)  65% updated (predicted 69%)      AROM   Lumbar Extension 100% WFL c Lt lower paraspinals                           [facility] Adult PT Treatment/Exercise - 11/19/21 0001       Knee/Hip Exercises: Stretches   Other Knee/Hip Stretches  lumbar trunk rotation in supine 15 sec x 3 bilateral,      Knee/Hip Exercises: Supine   Bridges Both;15 reps      Knee/Hip Exercises: Prone   Other Prone Exercises opposite arm/leg lift hold x 10 bilateral      Electrical Stimulation   Electrical Stimulation Location Lt/central lumbar    Electrical Stimulation Action IFC    Electrical Stimulation Parameters 10 mins to tolerate    Electrical Stimulation Goals Pain      Manual Therapy   Manual therapy comments skilled palpation during dry needling. compression/STM to Lt QL              Trigger Point Dry Needling - 11/19/21 0001     Consent Given? Yes    Education Handout Provided Previously provided    Muscles Treated Back/Hip Lumbar multifidi    Lumbar multifidi Response Twitch response elicited                     PT Short Term Goals - 11/07/21 0930       PT SHORT TERM GOAL #1   Title Pt will be independent in initial HEP    Status Achieved                PT Long Term Goals - 11/07/21 0930       PT LONG TERM GOAL #1   Title Pt will be independent in advanced HEP    Time 6    Period Weeks    Status On-going    Target Date 11/28/21      PT LONG TERM GOAL #2   Title pt will improve FOTO to >/= 69  % function    Baseline 65% on 10/14/2021    Time 6    Period Weeks    Status On-going    Target Date 11/28/21      PT LONG TERM GOAL #3   Title pt will improve left  LE strength to 5/5 grossly to improve funcitonal mobility.    Time 6    Period Weeks    Status On-going    Target Date 11/28/21      PT LONG TERM GOAL #4   Title Pt will report pain in her low back and left side of </= 2/10 with ADL's.    Time 6    Period Weeks    Status On-going    Target Date 11/28/21      PT LONG TERM GOAL #5   Title Pt will improve endurance to ambulate for > or = to 30 minutes in the community without limitations.    Time 6    Period Weeks    Status On-going    Target Date 11/28/21                   Plan - 11/19/21 1617     Clinical Impression Statement Pt. indicated benefit from treatment last time but symptoms returned in last day or so.  Focal symptoms noted in lumbar vs. previous level of lateral lumbar/hip symptoms as noted in past.  Pt. also reported desire to perform eval/treat on shoulder (has referral) and will plan to do so on next visit.    Personal Factors and Comorbidities Comorbidity 3+    Comorbidities arthritis, HTN, hypothyroidism, pneumonia, Pre diabetes  Rt TKA 03/08/2020, Lt TKA 06/21/2020    Examination-Activity Limitations Other;Squat;Stand;Carry    Examination-Participation Restrictions Community Activity;Other    Stability/Clinical Decision Making Stable/Uncomplicated  Rehab Potential Good    PT Frequency 2x / week    PT Duration 6 weeks    PT Treatment/Interventions ADLs/Self Care Home Management;Canalith Repostioning;Cryotherapy;Electrical Stimulation;Iontophoresis 4mg /ml Dexamethasone;Moist  Heat;Traction;Ultrasound;Gait training;Stair training;Functional mobility training;Therapeutic activities;Patient/family education;Neuromuscular re-education;Balance training;Therapeutic exercise;Manual techniques;Passive range of motion;Dry needling;Taping;Spinal Manipulations    PT Next Visit Plan Shoulder assessment.  Lumbar dry needling as desired.    PT Home Exercise Plan Access Code: [email or web address]    Consulted and Agree with Plan of Care Patient             Patient will benefit from skilled therapeutic intervention in order to improve the following deficits and impairments:  Pain, Postural dysfunction, Decreased activity tolerance, Difficulty walking, Decreased balance, Decreased endurance, Obesity, Decreased range of motion, Decreased strength, Decreased mobility  Visit Diagnosis: Chronic left-sided low back pain with left-sided sciatica  Muscle weakness (generalized)  Difficulty in walking, not elsewhere classified     Problem List Patient Active Problem List   Diagnosis Date Noted   Status post total left knee replacement 06/21/2020   Status post total right knee replacement 03/08/2020   Pain in right foot 07/05/2017   Chronic pain of right knee 07/05/2017   Chronic pain of left knee 07/05/2017   Unilateral primary osteoarthritis, left knee 07/05/2017   Unilateral primary osteoarthritis, right knee 07/05/2017   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, right 04/14/2017    Scot Jun, PT, DPT, OCS, ATC 11/19/21  4:34 PM    [facility] [address] [place], [place], [address] Phone: [phone]   Fax:  [phone]  Name: Melissa [name] MRN: [id number] Date of Birth: 1951-06-14

## 2021-11-25 ENCOUNTER — Encounter: Payer: Medicare HMO | Admitting: Rehabilitative and Restorative Service Providers"

## 2021-11-27 ENCOUNTER — Encounter: Payer: Self-pay | Admitting: Physical Therapy

## 2021-11-27 ENCOUNTER — Other Ambulatory Visit: Payer: Self-pay

## 2021-11-27 ENCOUNTER — Ambulatory Visit: Payer: Medicare HMO | Admitting: Physical Therapy

## 2021-11-27 DIAGNOSIS — G8929 Other chronic pain: Secondary | ICD-10-CM | POA: Diagnosis not present

## 2021-11-27 DIAGNOSIS — M25511 Pain in right shoulder: Secondary | ICD-10-CM

## 2021-11-27 DIAGNOSIS — R262 Difficulty in walking, not elsewhere classified: Secondary | ICD-10-CM | POA: Diagnosis not present

## 2021-11-27 DIAGNOSIS — R293 Abnormal posture: Secondary | ICD-10-CM

## 2021-11-27 DIAGNOSIS — M5442 Lumbago with sciatica, left side: Secondary | ICD-10-CM | POA: Diagnosis not present

## 2021-11-27 DIAGNOSIS — M6281 Muscle weakness (generalized): Secondary | ICD-10-CM

## 2021-11-27 NOTE — Therapy (Addendum)
[facility] [address] [place], [place], [address] Phone: [phone]   Fax:  [phone]  Physical Therapy Treatment/Recertification/Progress Note Progress Note Reporting Period 12/14/20 to 11/30/21  See note below for Objective Data and Assessment of Progress/Goals.      Patient Details  Name: Melissa [name] MRN: [id number] Date of Birth: [birth year] Referring Provider (PT): Jean Rosenthal MD   Encounter Date: 11/27/2021   PT End of Session - 11/27/21 0842     Visit Number 8    Number of Visits 16    Date for PT Re-Evaluation 12/25/21    Progress Note Due on Visit 10    PT Start Time 9528    PT Stop Time 0840    PT Time Calculation (min) 41 min    Activity Tolerance Patient tolerated treatment well    Behavior During Therapy [facility] for tasks assessed/performed             Past Medical History:  Diagnosis Date   Arthritis    osteo arthritis   Hypertension    Hypothyroidism    Pneumonia    Pre-diabetes    Takes metformin   Walking pneumonia     Past Surgical History:  Procedure Laterality Date   JOINT REPLACEMENT     MENISCUS REPAIR     right knee   MULTIPLE TOOTH EXTRACTIONS     with implants   ovaries removed     2003   TONSILLECTOMY     TOTAL KNEE ARTHROPLASTY Right 03/08/2020   Procedure: RIGHT TOTAL KNEE ARTHROPLASTY AND LEFT KNEE STEROID INJECTION;  Surgeon: Mcarthur Rossetti, MD;  Location: [facility];  Service: Orthopedics;  Laterality: Right;   TOTAL KNEE ARTHROPLASTY Left 06/21/2020   Procedure: LEFT TOTAL KNEE ARTHROPLASTY;  Surgeon: Mcarthur Rossetti, MD;  Location: [facility];  Service: Orthopedics;  Laterality: Left;    There were no vitals filed for this visit.   Subjective Assessment - 11/27/21 0802     Subjective didn't get to do HEP as much over the holidays.  wants to focus on Rt shoulder which started a few weeks ago.  can recall moving heavy objects in [employer] but isn't sure if this is  the main cause.    Pertinent History arthritis, HTN, hypothyroidism, pneumonia, Pre diabetes  Rt TKA 03/08/2020, Lt TKA 06/21/2020    Limitations Walking;House hold activities;Sitting;Lifting    Diagnostic tests X-ray    Patient Stated Goals Get rid of pain    Pain Score 3     Pain Location Back    Pain Orientation Right;Left;Lower    Pain Descriptors / Indicators Sore;Aching    Pain Type Chronic pain    Pain Onset More than a month ago    Pain Frequency Intermittent    Aggravating Factors  walking hurt    Pain Relieving Factors needling provides short term relief    Multiple Pain Sites Yes    Pain Score 7    Pain Location Shoulder    Pain Orientation Right    Pain Descriptors / Indicators Aching;Sharp    Pain Type Acute pain    Pain Onset 1 to 4 weeks ago    Pain Frequency Constant    Aggravating Factors  lifting, reaching    Pain Relieving Factors avoiding provoking positions                [facility] PT Assessment - 11/27/21 0807       Assessment   Medical Diagnosis M54.42 left  sided low back pain with left sciatica, bil shoulder pain    Referring Provider (PT) Jean Rosenthal MD      ROM / Strength   AROM / PROM / Strength AROM;PROM;Strength      AROM   Overall AROM Comments pain with flexion and abduction; Lt    AROM Assessment Site Shoulder    Right/Left Shoulder Right;Left    Right Shoulder Flexion 142 Degrees   goes into scaption due to pain   Right Shoulder ABduction 160 Degrees   pain   Right Shoulder Internal Rotation --   FIR WNL   Right Shoulder External Rotation --   FER WNL     Strength   Strength Assessment Site Shoulder    Right/Left Shoulder Right;Left    Right Shoulder Flexion 3/5    Right Shoulder ABduction 3-/5    Right Shoulder Internal Rotation 5/5    Right Shoulder External Rotation 3/5   pain but unable to resist                          [facility] Adult PT Treatment/Exercise - 11/27/21 0001       Exercises    Exercises Other Exercises    Other Exercises  see pt instructions - performed 5-10 reps of each exercise with mod instructions                     PT Education - 11/27/21 0841     Education Details shoulder HEP, TENS unit to purchase    Person(s) Educated Patient    Methods Explanation;Demonstration;Handout    Comprehension Verbalized understanding;Returned demonstration;Need further instruction              PT Short Term Goals - 11/07/21 0930       PT SHORT TERM GOAL #1   Title Pt will be independent in initial HEP    Status Achieved               PT Long Term Goals - 11/27/21 7169       PT LONG TERM GOAL #1   Title Pt will be independent in advanced HEP    Time 4    Period Weeks    Status On-going    Target Date 12/25/21      PT LONG TERM GOAL #2   Title pt will improve FOTO to >/= 69  % function    Baseline 65% on 10/14/2021    Time 4    Period Weeks    Status On-going    Target Date 12/25/21      PT LONG TERM GOAL #3   Title pt will improve left  LE strength to 5/5 grossly to improve funcitonal mobility.    Time 4    Period Weeks    Status On-going    Target Date 12/25/21      PT LONG TERM GOAL #4   Title Pt will report pain in her low back and left side of </= 2/10 with ADL's.    Time 4    Period Weeks    Status On-going    Target Date 12/25/21      PT LONG TERM GOAL #5   Title Pt will improve endurance to ambulate for > or = to 30 minutes in the community without limitations.    Time 4    Period Weeks    Status On-going    Target Date 12/25/21  Additional Long Term Goals   Additional Long Term Goals Yes      PT LONG TERM GOAL #6   Title perform Rt shoulder AROM with pain < 2/10 for improved function and mobility    Time 4    Period Weeks    Status New    Target Date 12/25/21      PT LONG TERM GOAL #7   Title demonstrate 4/5 Rt shoulder strength for improved function    Time 4    Period Weeks    Status New     Target Date 12/25/21                   Plan - 11/27/21 0831     Clinical Impression Statement Pt arrived today reporting pain in low back is minimal and not of concern at this time.  Her main complaint is Rt shoulder which she has PT order for, so assessment of Rt shoulder performed today.  Pt demonstrates pain, postural abnormalities, decreased strength and difficulty with AROM affecting functional mobility.  Will plan to continue all prior LTGs and added additional shoulder goals today for 2x/wk x 4 wks to maximize function and address deficits.    Personal Factors and Comorbidities Comorbidity 3+    Comorbidities arthritis, HTN, hypothyroidism, pneumonia, Pre diabetes  Rt TKA 03/08/2020, Lt TKA 06/21/2020    Examination-Activity Limitations Other;Squat;Stand;Carry    Examination-Participation Restrictions Community Activity;Other    Stability/Clinical Decision Making Stable/Uncomplicated    Rehab Potential Good    PT Frequency 2x / week    PT Duration 4 weeks    PT Treatment/Interventions ADLs/Self Care Home Management;Canalith Repostioning;Cryotherapy;Electrical Stimulation;Iontophoresis 4mg /ml Dexamethasone;Moist Heat;Traction;Ultrasound;Gait training;Stair training;Functional mobility training;Therapeutic activities;Patient/family education;Neuromuscular re-education;Balance training;Therapeutic exercise;Manual techniques;Passive range of motion;Dry needling;Taping;Spinal Manipulations    PT Next Visit Plan review shoulder HEP, progress as tolerated, TENS education if pt has one.  Lumbar dry needling as desired.    PT Home Exercise Plan Lumbar: Access Code: [email or web address] / Shoulder: Access Code: [email or web address]    Consulted and Agree with Plan of Care Patient             Patient will benefit from skilled therapeutic intervention in order to improve the following deficits and impairments:  Pain, Postural dysfunction, Decreased activity tolerance, Difficulty walking, Decreased balance,  Decreased endurance, Obesity, Decreased range of motion, Decreased strength, Decreased mobility, Increased muscle spasms, Impaired UE functional use  Visit Diagnosis: Chronic left-sided low back pain with left-sided sciatica - Plan: PT plan of care cert/re-cert  Muscle weakness (generalized) - Plan: PT plan of care cert/re-cert  Difficulty in walking, not elsewhere classified - Plan: PT plan of care cert/re-cert  Acute pain of right shoulder - Plan: PT plan of care cert/re-cert  Abnormal posture - Plan: PT plan of care cert/re-cert     Problem List Patient Active Problem List   Diagnosis Date Noted   Status post total left knee replacement 06/21/2020   Status post total right knee replacement 03/08/2020   Pain in right foot 07/05/2017   Chronic pain of right knee 07/05/2017   Chronic pain of left knee 07/05/2017   Unilateral primary osteoarthritis, left knee 07/05/2017   Unilateral primary osteoarthritis, right knee 07/05/2017   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, right 04/14/2017      Laureen Abrahams, PT, DPT 11/27/21 9:47 AM  Laureen Abrahams, PT, DPT 12/09/21 7:51 AM    [facility] Physical Therapy [place], [place],  [address] Phone: [phone]   Fax:  [phone]  Name: Melissa [name] MRN: [id number] Date of Birth: [birth year]

## 2021-11-27 NOTE — Patient Instructions (Addendum)
Access Code: [email or web address] URL: [email or web address] Date: 11/27/2021 Prepared by: Faustino Congress  Exercises Supine Shoulder Flexion AAROM - 2-3 x daily - 7 x weekly - 1-2 sets - 10 reps Supine Shoulder Flexion Extension Full Range AROM - 2-3 x daily - 7 x weekly - 1-2 sets - 10 reps Sidelying Shoulder External Rotation AROM - 2-3 x daily - 7 x weekly - 1-2 sets - 10 reps Standing Shoulder Scaption AAROM with Dowel - 2-3 x daily - 7 x weekly - 1-2 sets - 10 reps Seated Scapular Retraction - 5-10 x daily - 7 x weekly - 1 sets - 5-10 reps - 5 sec hold  Patient Education TENS Unit

## 2021-12-02 ENCOUNTER — Encounter: Payer: Self-pay | Admitting: Physical Therapy

## 2021-12-02 ENCOUNTER — Other Ambulatory Visit: Payer: Self-pay

## 2021-12-02 ENCOUNTER — Ambulatory Visit: Payer: Medicare HMO | Admitting: Physical Therapy

## 2021-12-02 DIAGNOSIS — M5442 Lumbago with sciatica, left side: Secondary | ICD-10-CM

## 2021-12-02 DIAGNOSIS — R262 Difficulty in walking, not elsewhere classified: Secondary | ICD-10-CM

## 2021-12-02 DIAGNOSIS — M6281 Muscle weakness (generalized): Secondary | ICD-10-CM

## 2021-12-02 DIAGNOSIS — G8929 Other chronic pain: Secondary | ICD-10-CM | POA: Diagnosis not present

## 2021-12-02 DIAGNOSIS — M25511 Pain in right shoulder: Secondary | ICD-10-CM | POA: Diagnosis not present

## 2021-12-02 NOTE — Therapy (Signed)
[facility] Physical [facility] [address] [place], [place], [address] Phone: [phone]   Fax:  [phone]  Physical Therapy Treatment  Patient Details  Name: Melissa [name] MRN: [id number] Date of Birth: 13-Mar-1951 Referring Provider (PT): Jean Rosenthal MD   Encounter Date: 12/02/2021   PT End of Session - 12/02/21 0848     Visit Number 9    Number of Visits 16    Date for PT Re-Evaluation 12/25/21    Progress Note Due on Visit 10    PT Start Time 0801    PT Stop Time 0839    PT Time Calculation (min) 38 min    Activity Tolerance Patient tolerated treatment well    Behavior During Therapy [facility] for tasks assessed/performed             Past Medical History:  Diagnosis Date   Arthritis    osteo arthritis   Hypertension    Hypothyroidism    Pneumonia    Pre-diabetes    Takes metformin   Walking pneumonia     Past Surgical History:  Procedure Laterality Date   JOINT REPLACEMENT     MENISCUS REPAIR     right knee   MULTIPLE TOOTH EXTRACTIONS     with implants   ovaries removed     2003   TONSILLECTOMY     TOTAL KNEE ARTHROPLASTY Right 03/08/2020   Procedure: RIGHT TOTAL KNEE ARTHROPLASTY AND LEFT KNEE STEROID INJECTION;  Surgeon: Mcarthur Rossetti, MD;  Location: [facility];  Service: Orthopedics;  Laterality: Right;   TOTAL KNEE ARTHROPLASTY Left 06/21/2020   Procedure: LEFT TOTAL KNEE ARTHROPLASTY;  Surgeon: Mcarthur Rossetti, MD;  Location: [facility];  Service: Orthopedics;  Laterality: Left;    There were no vitals filed for this visit.   Subjective Assessment - 12/02/21 0802     Subjective back was stiff and painful this morning; sidelying external rotation is difficult.    Pertinent History arthritis, HTN, hypothyroidism, pneumonia, Pre diabetes  Rt TKA 03/08/2020, Lt TKA 06/21/2020    Limitations Walking;House hold activities;Sitting;Lifting    Diagnostic tests X-ray    Patient Stated Goals Get rid of pain    Pain  Score 0-No pain   this morning 6-7/10   Pain Location Back    Pain Orientation Right;Left;Lower    Pain Descriptors / Indicators Aching;Sore    Pain Type Chronic pain    Pain Onset More than a month ago    Pain Frequency Intermittent    Aggravating Factors  walking, worse in the morning    Pain Relieving Factors needling provides short term relief    Pain Score 4    Pain Location Shoulder    Pain Orientation Right;Left    Pain Descriptors / Indicators Aching;Sharp    Pain Type Acute pain    Pain Onset 1 to 4 weeks ago    Pain Frequency Constant    Aggravating Factors  lifting, reaching    Pain Relieving Factors avoiding overhead activity                               [facility] Adult PT Treatment/Exercise - 12/02/21 0808       Exercises   Exercises Shoulder      Shoulder Exercises: Supine   Flexion AAROM;Both;20 reps    Flexion Limitations then x 10 with 1# bil      Shoulder Exercises: Seated   Retraction Both;20 reps  Shoulder Exercises: Sidelying   External Rotation Both;10 reps   2 sets   External Rotation Weight (lbs) 1   on Lt; no weight on Rt     Shoulder Exercises: Standing   ABduction AAROM;20 reps;Right    ABduction Limitations 1# bar      Shoulder Exercises: Pulleys   Flexion 2 minutes    ABduction 2 minutes      Manual Therapy   Manual therapy comments skilled palpation during dry needling. compression/STM to Lt glute med followed by percussive device              Trigger Point Dry Needling - 12/02/21 0839     Consent Given? Yes    Education Handout Provided Previously provided    Muscles Treated Back/Hip Gluteus medius    Gluteus Medius Response Twitch response elicited                     PT Short Term Goals - 11/07/21 0930       PT SHORT TERM GOAL #1   Title Pt will be independent in initial HEP    Status Achieved               PT Long Term Goals - 11/27/21 1552       PT LONG TERM GOAL #1    Title Pt will be independent in advanced HEP    Time 4    Period Weeks    Status On-going    Target Date 12/25/21      PT LONG TERM GOAL #2   Title pt will improve FOTO to >/= 69  % function    Baseline 65% on 10/14/2021    Time 4    Period Weeks    Status On-going    Target Date 12/25/21      PT LONG TERM GOAL #3   Title pt will improve left  LE strength to 5/5 grossly to improve funcitonal mobility.    Time 4    Period Weeks    Status On-going    Target Date 12/25/21      PT LONG TERM GOAL #4   Title Pt will report pain in her low back and left side of </= 2/10 with ADL's.    Time 4    Period Weeks    Status On-going    Target Date 12/25/21      PT LONG TERM GOAL #5   Title Pt will improve endurance to ambulate for > or = to 30 minutes in the community without limitations.    Time 4    Period Weeks    Status On-going    Target Date 12/25/21      Additional Long Term Goals   Additional Long Term Goals Yes      PT LONG TERM GOAL #6   Title perform Rt shoulder AROM with pain < 2/10 for improved function and mobility    Time 4    Period Weeks    Status New    Target Date 12/25/21      PT LONG TERM GOAL #7   Title demonstrate 4/5 Rt shoulder strength for improved function    Time 4    Period Weeks    Status New    Target Date 12/25/21                   Plan - 12/02/21 0848     Clinical Impression Statement Session today focused on  review of shoulder HEP with discussion for modifications as well as progression.  DN with manual performed today as well to Lt glutes due to elevated pain this morning.  Will continue to benefit from PT to maximize function.    Personal Factors and Comorbidities Comorbidity 3+    Comorbidities arthritis, HTN, hypothyroidism, pneumonia, Pre diabetes  Rt TKA 03/08/2020, Lt TKA 06/21/2020    Examination-Activity Limitations Other;Squat;Stand;Carry    Examination-Participation Restrictions Community Activity;Other     Stability/Clinical Decision Making Stable/Uncomplicated    Rehab Potential Good    PT Frequency 2x / week    PT Duration 4 weeks    PT Treatment/Interventions ADLs/Self Care Home Management;Canalith Repostioning;Cryotherapy;Electrical Stimulation;Iontophoresis 4mg /ml Dexamethasone;Moist Heat;Traction;Ultrasound;Gait training;Stair training;Functional mobility training;Therapeutic activities;Patient/family education;Neuromuscular re-education;Balance training;Therapeutic exercise;Manual techniques;Passive range of motion;Dry needling;Taping;Spinal Manipulations    PT Next Visit Plan work on standing shoulder exercise as pain allows, TENS education if pt has one.  Lumbar dry needling as desired.    PT Home Exercise Plan Lumbar: Access Code: [email or web address] / Shoulder: Access Code: [email or web address]    Consulted and Agree with Plan of Care Patient             Patient will benefit from skilled therapeutic intervention in order to improve the following deficits and impairments:  Pain, Postural dysfunction, Decreased activity tolerance, Difficulty walking, Decreased balance, Decreased endurance, Obesity, Decreased range of motion, Decreased strength, Decreased mobility, Increased muscle spasms, Impaired UE functional use  Visit Diagnosis: Chronic left-sided low back pain with left-sided sciatica  Muscle weakness (generalized)  Difficulty in walking, not elsewhere classified  Acute pain of right shoulder     Problem List Patient Active Problem List   Diagnosis Date Noted   Status post total left knee replacement 06/21/2020   Status post total right knee replacement 03/08/2020   Pain in right foot 07/05/2017   Chronic pain of right knee 07/05/2017   Chronic pain of left knee 07/05/2017   Unilateral primary osteoarthritis, left knee 07/05/2017   Unilateral primary osteoarthritis, right knee 07/05/2017   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, right 04/14/2017         Laureen Abrahams, PT, DPT 12/02/21 8:50 AM     [facility] Physical Therapy [address] [place], [place], [address] Phone: [phone]   Fax:  [phone]  Name: Melissa [name] MRN: [id number] Date of Birth: 1951-03-13

## 2021-12-04 ENCOUNTER — Other Ambulatory Visit: Payer: Self-pay

## 2021-12-04 ENCOUNTER — Ambulatory Visit: Payer: Medicare HMO | Admitting: Rehabilitative and Restorative Service Providers"

## 2021-12-04 ENCOUNTER — Encounter: Payer: Self-pay | Admitting: Rehabilitative and Restorative Service Providers"

## 2021-12-04 DIAGNOSIS — G8929 Other chronic pain: Secondary | ICD-10-CM | POA: Diagnosis not present

## 2021-12-04 DIAGNOSIS — M6281 Muscle weakness (generalized): Secondary | ICD-10-CM | POA: Diagnosis not present

## 2021-12-04 DIAGNOSIS — R262 Difficulty in walking, not elsewhere classified: Secondary | ICD-10-CM | POA: Diagnosis not present

## 2021-12-04 DIAGNOSIS — M25511 Pain in right shoulder: Secondary | ICD-10-CM

## 2021-12-04 DIAGNOSIS — M5442 Lumbago with sciatica, left side: Secondary | ICD-10-CM

## 2021-12-04 NOTE — Therapy (Signed)
[facility] Physical Therapy [address] [place], [place], [address] Phone: [phone]   Fax:  [phone]  Physical Therapy Treatment  Patient Details  Name: Melissa [name] MRN: [id number] Date of Birth: 1951-10-20 Referring Provider (PT): Jean Rosenthal MD   Encounter Date: 12/04/2021   PT End of Session - 12/04/21 0800     Visit Number 10    Number of Visits 16    Date for PT Re-Evaluation 12/25/21    Progress Note Due on Visit 40    PT Start Time 0800    PT Stop Time 0842    PT Time Calculation (min) 42 min    Activity Tolerance Patient tolerated treatment well    Behavior During Therapy [facility] for tasks assessed/performed             Past Medical History:  Diagnosis Date   Arthritis    osteo arthritis   Hypertension    Hypothyroidism    Pneumonia    Pre-diabetes    Takes metformin   Walking pneumonia     Past Surgical History:  Procedure Laterality Date   JOINT REPLACEMENT     MENISCUS REPAIR     right knee   MULTIPLE TOOTH EXTRACTIONS     with implants   ovaries removed     2003   TONSILLECTOMY     TOTAL KNEE ARTHROPLASTY Right 03/08/2020   Procedure: RIGHT TOTAL KNEE ARTHROPLASTY AND LEFT KNEE STEROID INJECTION;  Surgeon: Mcarthur Rossetti, MD;  Location: [facility];  Service: Orthopedics;  Laterality: Right;   TOTAL KNEE ARTHROPLASTY Left 06/21/2020   Procedure: LEFT TOTAL KNEE ARTHROPLASTY;  Surgeon: Mcarthur Rossetti, MD;  Location: [facility];  Service: Orthopedics;  Laterality: Left;    There were no vitals filed for this visit.   Subjective Assessment - 12/04/21 0806     Subjective Pt. indicated feeling pain yesterday and didn't do exercises.  Pt. indicated 7/10 pain noted in back and shoulders.    Pertinent History arthritis, HTN, hypothyroidism, pneumonia, Pre diabetes  Rt TKA 03/08/2020, Lt TKA 06/21/2020    Limitations Walking;House hold activities;Sitting;Lifting    Diagnostic tests X-ray    Patient  Stated Goals Get rid of pain    Currently in Pain? Yes    Pain Score 7     Pain Location Back    Pain Orientation Lower    Pain Descriptors / Indicators Aching;Tightness;Sore    Pain Type Chronic pain    Pain Onset More than a month ago    Pain Frequency Intermittent    Aggravating Factors  increased insidious pain yesterday    Pain Relieving Factors medicine    Pain Score 7    Pain Location Shoulder    Pain Orientation Left;Right    Pain Descriptors / Indicators Aching;Sharp    Pain Type Acute pain    Pain Onset 1 to 4 weeks ago    Pain Frequency Constant    Aggravating Factors  reaching, lifting arm, pain at rest yesterday    Pain Relieving Factors medicine                               [facility] Adult PT Treatment/Exercise - 12/04/21 0001       Knee/Hip Exercises: Stretches   Other Knee/Hip Stretches lumbar rotation stretch 15 sec x 5 bilateral      Knee/Hip Exercises: Supine   Bridges 2 sets;10 reps;Both  Shoulder Exercises: Supine   Flexion Right;20 reps   1 lb   Other Supine Exercises supine 90 deg flexion circles cw, ccw 30 x each 2 lb      Shoulder Exercises: Sidelying   External Rotation Right   2 x 10     Manual Therapy   Manual therapy comments compression to Rt infraspinatus              Trigger Point Dry Needling - 12/04/21 0001     Consent Given? Yes    Education Handout Provided Previously provided    Muscles Treated Upper Quadrant Infraspinatus   Rt   Infraspinatus Response Twitch response elicited                     PT Short Term Goals - 11/07/21 0930       PT SHORT TERM GOAL #1   Title Pt will be independent in initial HEP    Status Achieved               PT Long Term Goals - 11/27/21 5597       PT LONG TERM GOAL #1   Title Pt will be independent in advanced HEP    Time 4    Period Weeks    Status On-going    Target Date 12/25/21      PT LONG TERM GOAL #2   Title pt will improve FOTO to  >/= 69  % function    Baseline 65% on 10/14/2021    Time 4    Period Weeks    Status On-going    Target Date 12/25/21      PT LONG TERM GOAL #3   Title pt will improve left  LE strength to 5/5 grossly to improve funcitonal mobility.    Time 4    Period Weeks    Status On-going    Target Date 12/25/21      PT LONG TERM GOAL #4   Title Pt will report pain in her low back and left side of </= 2/10 with ADL's.    Time 4    Period Weeks    Status On-going    Target Date 12/25/21      PT LONG TERM GOAL #5   Title Pt will improve endurance to ambulate for > or = to 30 minutes in the community without limitations.    Time 4    Period Weeks    Status On-going    Target Date 12/25/21      Additional Long Term Goals   Additional Long Term Goals Yes      PT LONG TERM GOAL #6   Title perform Rt shoulder AROM with pain < 2/10 for improved function and mobility    Time 4    Period Weeks    Status New    Target Date 12/25/21      PT LONG TERM GOAL #7   Title demonstrate 4/5 Rt shoulder strength for improved function    Time 4    Period Weeks    Status New    Target Date 12/25/21                   Plan - 12/04/21 0824     Clinical Impression Statement Higher severity of symptoms throughout body indicated from yesterday to today.  Spent time reviewing existing HEP and importance of use for possible reduction of symptoms in times where pain was increased.  Discussed performing interventions to tightness but not to any pain increases to help improve symptoms.  Rt shoulder weakness and restriction in active ER most evident for Rt shoulder.  Painful arc noted in flexion against gravity but reduced dramatically after use of DN.    Personal Factors and Comorbidities Comorbidity 3+    Comorbidities arthritis, HTN, hypothyroidism, pneumonia, Pre diabetes  Rt TKA 03/08/2020, Lt TKA 06/21/2020    Examination-Activity Limitations Other;Squat;Stand;Carry    Examination-Participation  Restrictions Community Activity;Other    Stability/Clinical Decision Making Stable/Uncomplicated    Rehab Potential Good    PT Frequency 2x / week    PT Duration 4 weeks    PT Treatment/Interventions ADLs/Self Care Home Management;Canalith Repostioning;Cryotherapy;Electrical Stimulation;Iontophoresis 4mg /ml Dexamethasone;Moist Heat;Traction;Ultrasound;Gait training;Stair training;Functional mobility training;Therapeutic activities;Patient/family education;Neuromuscular re-education;Balance training;Therapeutic exercise;Manual techniques;Passive range of motion;Dry needling;Taping;Spinal Manipulations    PT Next Visit Plan DN for infraspinatus if desired.  Progressive strengthening    PT Home Exercise Plan Lumbar: Access Code: [email or web address] / Shoulder: Access Code: [email or web address]    Consulted and Agree with Plan of Care Patient             Patient will benefit from skilled therapeutic intervention in order to improve the following deficits and impairments:  Pain, Postural dysfunction, Decreased activity tolerance, Difficulty walking, Decreased balance, Decreased endurance, Obesity, Decreased range of motion, Decreased strength, Decreased mobility, Increased muscle spasms, Impaired UE functional use  Visit Diagnosis: Chronic left-sided low back pain with left-sided sciatica  Muscle weakness (generalized)  Difficulty in walking, not elsewhere classified  Acute pain of right shoulder     Problem List Patient Active Problem List   Diagnosis Date Noted   Status post total left knee replacement 06/21/2020   Status post total right knee replacement 03/08/2020   Pain in right foot 07/05/2017   Chronic pain of right knee 07/05/2017   Chronic pain of left knee 07/05/2017   Unilateral primary osteoarthritis, left knee 07/05/2017   Unilateral primary osteoarthritis, right knee 07/05/2017   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, right 04/14/2017    Scot Jun, PT, DPT,  OCS, ATC 12/04/21  8:53 AM    [facility] Physical Therapy [address] [place], [place], [address] Phone: [phone]   Fax:  [phone]  Name: Melissa [name] MRN: [id number] Date of Birth: January 30, 1951

## 2021-12-09 ENCOUNTER — Encounter: Payer: Self-pay | Admitting: Physical Therapy

## 2021-12-09 ENCOUNTER — Ambulatory Visit: Payer: Medicare HMO | Admitting: Physical Therapy

## 2021-12-09 ENCOUNTER — Other Ambulatory Visit: Payer: Self-pay

## 2021-12-09 DIAGNOSIS — M6281 Muscle weakness (generalized): Secondary | ICD-10-CM

## 2021-12-09 DIAGNOSIS — M25511 Pain in right shoulder: Secondary | ICD-10-CM | POA: Diagnosis not present

## 2021-12-09 DIAGNOSIS — R293 Abnormal posture: Secondary | ICD-10-CM | POA: Diagnosis not present

## 2021-12-09 NOTE — Therapy (Signed)
[facility] Physical Therapy [address] [place], [place], [address] Phone: [phone]   Fax:  [phone]  Physical Therapy Treatment  Patient Details  Name: Melissa [name] MRN: [id number] Date of Birth: 12-20-1950 Referring Provider (PT): Jean Rosenthal MD   Encounter Date: 12/09/2021   PT End of Session - 12/09/21 0846     Visit Number 11    Number of Visits 16    Date for PT Re-Evaluation 12/25/21    Progress Note Due on Visit 18    PT Start Time 0800    PT Stop Time 0838    PT Time Calculation (min) 38 min    Activity Tolerance Patient tolerated treatment well    Behavior During Therapy [facility] for tasks assessed/performed             Past Medical History:  Diagnosis Date   Arthritis    osteo arthritis   Hypertension    Hypothyroidism    Pneumonia    Pre-diabetes    Takes metformin   Walking pneumonia     Past Surgical History:  Procedure Laterality Date   JOINT REPLACEMENT     MENISCUS REPAIR     right knee   MULTIPLE TOOTH EXTRACTIONS     with implants   ovaries removed     2003   TONSILLECTOMY     TOTAL KNEE ARTHROPLASTY Right 03/08/2020   Procedure: RIGHT TOTAL KNEE ARTHROPLASTY AND LEFT KNEE STEROID INJECTION;  Surgeon: Mcarthur Rossetti, MD;  Location: [facility];  Service: Orthopedics;  Laterality: Right;   TOTAL KNEE ARTHROPLASTY Left 06/21/2020   Procedure: LEFT TOTAL KNEE ARTHROPLASTY;  Surgeon: Mcarthur Rossetti, MD;  Location: [facility];  Service: Orthopedics;  Laterality: Left;    There were no vitals filed for this visit.   Subjective Assessment - 12/09/21 0802     Subjective felt like the DN was helpful, pain has been "okay"    Pertinent History arthritis, HTN, hypothyroidism, pneumonia, Pre diabetes  Rt TKA 03/08/2020, Lt TKA 06/21/2020    Limitations Walking;House hold activities;Sitting;Lifting    Diagnostic tests X-ray    Patient Stated Goals Get rid of pain    Currently in Pain? Yes    Pain Onset  More than a month ago    Pain Score 3    Pain Location Shoulder    Pain Orientation Right    Pain Descriptors / Indicators Aching    Pain Onset 1 to 4 weeks ago                               [facility] Adult PT Treatment/Exercise - 12/09/21 0843       Exercises   Other Exercises  reviewed and updated shoulder HEP as pt has demonstrated improved AROM and ability to progress exercises - see pt instructions for updated HEP      Manual Therapy   Manual therapy comments STM with compression to Rt infraspinatus, supraspinatus and levator scapula              Trigger Point Dry Needling - 12/09/21 0845     Consent Given? Yes    Education Handout Provided Previously provided    Muscles Treated Head and Neck Levator scapulae    Muscles Treated Upper Quadrant Supraspinatus;Infraspinatus    Levator Scapulae Response Twitch response elicited    Supraspinatus Response Twitch response elicited    Infraspinatus Response Twitch response elicited  PT Education - 12/09/21 0846     Education Details updated HEP    Person(s) Educated Patient    Methods Explanation;Demonstration;Handout    Comprehension Verbalized understanding;Returned demonstration              PT Short Term Goals - 11/07/21 0930       PT SHORT TERM GOAL #1   Title Pt will be independent in initial HEP    Status Achieved               PT Long Term Goals - 11/27/21 8756       PT LONG TERM GOAL #1   Title Pt will be independent in advanced HEP    Time 4    Period Weeks    Status On-going    Target Date 12/25/21      PT LONG TERM GOAL #2   Title pt will improve FOTO to >/= 69  % function    Baseline 65% on 10/14/2021    Time 4    Period Weeks    Status On-going    Target Date 12/25/21      PT LONG TERM GOAL #3   Title pt will improve left  LE strength to 5/5 grossly to improve funcitonal mobility.    Time 4    Period Weeks    Status On-going     Target Date 12/25/21      PT LONG TERM GOAL #4   Title Pt will report pain in her low back and left side of </= 2/10 with ADL's.    Time 4    Period Weeks    Status On-going    Target Date 12/25/21      PT LONG TERM GOAL #5   Title Pt will improve endurance to ambulate for > or = to 30 minutes in the community without limitations.    Time 4    Period Weeks    Status On-going    Target Date 12/25/21      Additional Long Term Goals   Additional Long Term Goals Yes      PT LONG TERM GOAL #6   Title perform Rt shoulder AROM with pain < 2/10 for improved function and mobility    Time 4    Period Weeks    Status New    Target Date 12/25/21      PT LONG TERM GOAL #7   Title demonstrate 4/5 Rt shoulder strength for improved function    Time 4    Period Weeks    Status New    Target Date 12/25/21                   Plan - 12/09/21 0847     Clinical Impression Statement Pt tolerated session well today with positive response to DN adding levator and supraspinatus as well and pt reporting reduction in elbow referred pain following session. Able to progress HEP today to include adding weight and progressing from AA to AROM exercises.  Will continue to benefit from PT to maximize function.    Personal Factors and Comorbidities Comorbidity 3+    Comorbidities arthritis, HTN, hypothyroidism, pneumonia, Pre diabetes  Rt TKA 03/08/2020, Lt TKA 06/21/2020    Examination-Activity Limitations Other;Squat;Stand;Carry    Examination-Participation Restrictions Community Activity;Other    Stability/Clinical Decision Making Stable/Uncomplicated    Rehab Potential Good    PT Frequency 2x / week    PT Duration 4 weeks    PT Treatment/Interventions  ADLs/Self Care Home Management;Canalith Repostioning;Cryotherapy;Electrical Stimulation;Iontophoresis 4mg /ml Dexamethasone;Moist Heat;Traction;Ultrasound;Gait training;Stair training;Functional mobility training;Therapeutic activities;Patient/family  education;Neuromuscular re-education;Balance training;Therapeutic exercise;Manual techniques;Passive range of motion;Dry needling;Taping;Spinal Manipulations    PT Next Visit Plan assess response to DN/manual.  Progressive strengthening as able, review updated shoulder HEP    PT Home Exercise Plan Lumbar: Access Code: [email or web address] / Shoulder: Access Code: [email or web address]    Consulted and Agree with Plan of Care Patient             Patient will benefit from skilled therapeutic intervention in order to improve the following deficits and impairments:  Pain, Postural dysfunction, Decreased activity tolerance, Difficulty walking, Decreased balance, Decreased endurance, Obesity, Decreased range of motion, Decreased strength, Decreased mobility, Increased muscle spasms, Impaired UE functional use  Visit Diagnosis: Muscle weakness (generalized)  Acute pain of right shoulder  Abnormal posture     Problem List Patient Active Problem List   Diagnosis Date Noted   Status post total left knee replacement 06/21/2020   Status post total right knee replacement 03/08/2020   Pain in right foot 07/05/2017   Chronic pain of right knee 07/05/2017   Chronic pain of left knee 07/05/2017   Unilateral primary osteoarthritis, left knee 07/05/2017   Unilateral primary osteoarthritis, right knee 07/05/2017   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, right 04/14/2017      Laureen Abrahams, PT, DPT 12/09/21 8:51 AM     [facility] Physical Therapy [address] [place], [place], [address] Phone: [phone]   Fax:  [phone]  Name: FRIMY UFFELMAN MRN: [id number] Date of Birth: [birth year]

## 2021-12-09 NOTE — Patient Instructions (Signed)
Access Code: [email or web address] URL: [email or web address] Date: 12/09/2021 Prepared by: Faustino Congress  Exercises Supine Shoulder Flexion Extension Full Range AROM - 2 x daily - 7 x weekly - 1-2 sets - 10 reps Sidelying Shoulder External Rotation AROM - 2 x daily - 7 x weekly - 1-2 sets - 10 reps Sidelying Shoulder Abduction Full Range of Motion with Dumbbell - 2 x daily - 7 x weekly - 2-3 sets - 10 reps Standing Shoulder Scaption - 2 x daily - 7 x weekly - 3 sets - 10 reps Seated Scapular Retraction - 5-10 x daily - 7 x weekly - 1 sets - 5-10 reps - 5 sec hold Standing Row with Anchored Resistance - 2 x daily - 7 x weekly - 3 sets - 10 reps  Patient Education TENS Unit

## 2021-12-12 ENCOUNTER — Encounter: Payer: Medicare HMO | Admitting: Physical Therapy

## 2021-12-16 ENCOUNTER — Ambulatory Visit: Payer: Medicare HMO | Admitting: Physical Therapy

## 2021-12-16 ENCOUNTER — Other Ambulatory Visit: Payer: Self-pay

## 2021-12-16 ENCOUNTER — Encounter: Payer: Self-pay | Admitting: Physical Therapy

## 2021-12-16 DIAGNOSIS — M25511 Pain in right shoulder: Secondary | ICD-10-CM

## 2021-12-16 DIAGNOSIS — M5442 Lumbago with sciatica, left side: Secondary | ICD-10-CM

## 2021-12-16 DIAGNOSIS — R293 Abnormal posture: Secondary | ICD-10-CM | POA: Diagnosis not present

## 2021-12-16 DIAGNOSIS — M6281 Muscle weakness (generalized): Secondary | ICD-10-CM | POA: Diagnosis not present

## 2021-12-16 DIAGNOSIS — R262 Difficulty in walking, not elsewhere classified: Secondary | ICD-10-CM | POA: Diagnosis not present

## 2021-12-16 DIAGNOSIS — G8929 Other chronic pain: Secondary | ICD-10-CM

## 2021-12-16 NOTE — Therapy (Signed)
[facility] Physical Therapy [address] [place], [place], [address] Phone: [phone]   Fax:  [phone]  Physical Therapy Treatment  Patient Details  Name: Melissa [name] MRN: [id number] Date of Birth: 1951-03-20 Referring Provider (PT): Jean Rosenthal MD   Encounter Date: 12/16/2021   PT End of Session - 12/16/21 0842     Visit Number 12    Number of Visits 16    Date for PT Re-Evaluation 12/25/21    Progress Note Due on Visit 25    PT Start Time 0759    PT Stop Time 0837    PT Time Calculation (min) 38 min    Activity Tolerance Patient tolerated treatment well    Behavior During Therapy [facility] for tasks assessed/performed             Past Medical History:  Diagnosis Date   Arthritis    osteo arthritis   Hypertension    Hypothyroidism    Pneumonia    Pre-diabetes    Takes metformin   Walking pneumonia     Past Surgical History:  Procedure Laterality Date   JOINT REPLACEMENT     MENISCUS REPAIR     right knee   MULTIPLE TOOTH EXTRACTIONS     with implants   ovaries removed     2003   TONSILLECTOMY     TOTAL KNEE ARTHROPLASTY Right 03/08/2020   Procedure: RIGHT TOTAL KNEE ARTHROPLASTY AND LEFT KNEE STEROID INJECTION;  Surgeon: Mcarthur Rossetti, MD;  Location: [facility];  Service: Orthopedics;  Laterality: Right;   TOTAL KNEE ARTHROPLASTY Left 06/21/2020   Procedure: LEFT TOTAL KNEE ARTHROPLASTY;  Surgeon: Mcarthur Rossetti, MD;  Location: [facility];  Service: Orthopedics;  Laterality: Left;    There were no vitals filed for this visit.   Subjective Assessment - 12/16/21 0800     Subjective really sore today, feels like something is strained from distraction test last week.    Pertinent History arthritis, HTN, hypothyroidism, pneumonia, Pre diabetes  Rt TKA 03/08/2020, Lt TKA 06/21/2020    Limitations Walking;House hold activities;Sitting;Lifting    Diagnostic tests X-ray    Patient Stated Goals Get rid of pain     Currently in Pain? Yes    Pain Score 7     Pain Location Back    Pain Orientation Lower;Left    Pain Descriptors / Indicators Aching;Tightness;Sore    Pain Type Chronic pain    Pain Onset More than a month ago    Pain Frequency Intermittent    Aggravating Factors  still residual    Pain Relieving Factors medicine    Pain Onset 1 to 4 weeks ago                [facility] PT Assessment - 12/16/21 0842       Assessment   Medical Diagnosis M54.42 left sided low back pain with left sciatica, bil shoulder pain    Referring Provider (PT) Jean Rosenthal MD      Observation/Other Assessments   Focus on Therapeutic Outcomes (FOTO)  64                           [facility] Adult PT Treatment/Exercise - 12/16/21 0841       Self-Care   Self-Care Other Self-Care Comments    Other Self-Care Comments  discussed exercise modifications for soreness as well as current progress and POC.  plan to hold PT at this time and refer  back to MD      Manual Therapy   Manual therapy comments STM with compression to Lt glute med and lumbar paraspinals; use of percussive device as well              Trigger Point Dry Needling - 12/16/21 0842     Consent Given? Yes    Education Handout Provided Previously provided    Muscles Treated Back/Hip Gluteus medius;Erector spinae    Gluteus Medius Response Twitch response elicited    Erector spinae Response Twitch response elicited                     PT Short Term Goals - 11/07/21 0930       PT SHORT TERM GOAL #1   Title Pt will be independent in initial HEP    Status Achieved               PT Long Term Goals - 11/27/21 8588       PT LONG TERM GOAL #1   Title Pt will be independent in advanced HEP    Time 4    Period Weeks    Status On-going    Target Date 12/25/21      PT LONG TERM GOAL #2   Title pt will improve FOTO to >/= 69  % function    Baseline 65% on 10/14/2021    Time 4    Period Weeks     Status On-going    Target Date 12/25/21      PT LONG TERM GOAL #3   Title pt will improve left  LE strength to 5/5 grossly to improve funcitonal mobility.    Time 4    Period Weeks    Status On-going    Target Date 12/25/21      PT LONG TERM GOAL #4   Title Pt will report pain in her low back and left side of </= 2/10 with ADL's.    Time 4    Period Weeks    Status On-going    Target Date 12/25/21      PT LONG TERM GOAL #5   Title Pt will improve endurance to ambulate for > or = to 30 minutes in the community without limitations.    Time 4    Period Weeks    Status On-going    Target Date 12/25/21      Additional Long Term Goals   Additional Long Term Goals Yes      PT LONG TERM GOAL #6   Title perform Rt shoulder AROM with pain < 2/10 for improved function and mobility    Time 4    Period Weeks    Status New    Target Date 12/25/21      PT LONG TERM GOAL #7   Title demonstrate 4/5 Rt shoulder strength for improved function    Time 4    Period Weeks    Status New    Target Date 12/25/21                   Plan - 12/16/21 0843     Clinical Impression Statement Pt with limited progress noted today in low back and shoulders.  At this time recommending holding PT until after pt follows up with MD due to limited progress.    Personal Factors and Comorbidities Comorbidity 3+    Comorbidities arthritis, HTN, hypothyroidism, pneumonia, Pre diabetes  Rt TKA 03/08/2020, Lt TKA 06/21/2020  Examination-Activity Limitations Other;Squat;Stand;Carry    Examination-Participation Restrictions Community Activity;Other    Stability/Clinical Decision Making Stable/Uncomplicated    Rehab Potential Good    PT Frequency 2x / week    PT Duration 4 weeks    PT Treatment/Interventions ADLs/Self Care Home Management;Canalith Repostioning;Cryotherapy;Electrical Stimulation;Iontophoresis 4mg /ml Dexamethasone;Moist Heat;Traction;Ultrasound;Gait training;Stair training;Functional  mobility training;Therapeutic activities;Patient/family education;Neuromuscular re-education;Balance training;Therapeutic exercise;Manual techniques;Passive range of motion;Dry needling;Taping;Spinal Manipulations    PT Next Visit Plan hold PT, pt to return to MD    PT Home Exercise Plan Lumbar: Access Code: [email or web address] / Shoulder: Access Code: [email or web address]    Consulted and Agree with Plan of Care Patient             Patient will benefit from skilled therapeutic intervention in order to improve the following deficits and impairments:  Pain, Postural dysfunction, Decreased activity tolerance, Difficulty walking, Decreased balance, Decreased endurance, Obesity, Decreased range of motion, Decreased strength, Decreased mobility, Increased muscle spasms, Impaired UE functional use  Visit Diagnosis: Muscle weakness (generalized)  Acute pain of right shoulder  Abnormal posture  Chronic left-sided low back pain with left-sided sciatica  Difficulty in walking, not elsewhere classified     Problem List Patient Active Problem List   Diagnosis Date Noted   Status post total left knee replacement 06/21/2020   Status post total right knee replacement 03/08/2020   Pain in right foot 07/05/2017   Chronic pain of right knee 07/05/2017   Chronic pain of left knee 07/05/2017   Unilateral primary osteoarthritis, left knee 07/05/2017   Unilateral primary osteoarthritis, right knee 07/05/2017   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, right 04/14/2017      Laureen Abrahams, PT, DPT 12/16/21 11:02 AM     [facility] [address] [place], [place], [address] Phone: [phone]   Fax:  [phone]  Name: TANAYSIA BHARDWAJ MRN: [id number] Date of Birth: [birth year]

## 2021-12-18 ENCOUNTER — Encounter: Payer: Medicare HMO | Admitting: Physical Therapy

## 2021-12-23 DIAGNOSIS — H5213 Myopia, bilateral: Secondary | ICD-10-CM | POA: Diagnosis not present

## 2021-12-23 DIAGNOSIS — E119 Type 2 diabetes mellitus without complications: Secondary | ICD-10-CM | POA: Diagnosis not present

## 2021-12-29 ENCOUNTER — Encounter: Payer: Self-pay | Admitting: Orthopaedic Surgery

## 2021-12-29 ENCOUNTER — Other Ambulatory Visit: Payer: Self-pay

## 2021-12-29 ENCOUNTER — Ambulatory Visit (INDEPENDENT_AMBULATORY_CARE_PROVIDER_SITE_OTHER): Payer: Medicare HMO | Admitting: Orthopaedic Surgery

## 2021-12-29 DIAGNOSIS — M25511 Pain in right shoulder: Secondary | ICD-10-CM

## 2021-12-29 DIAGNOSIS — M25512 Pain in left shoulder: Secondary | ICD-10-CM | POA: Diagnosis not present

## 2021-12-29 DIAGNOSIS — G8929 Other chronic pain: Secondary | ICD-10-CM

## 2021-12-29 DIAGNOSIS — M5442 Lumbago with sciatica, left side: Secondary | ICD-10-CM | POA: Diagnosis not present

## 2021-12-29 NOTE — Progress Notes (Signed)
The patient comes in today after having extensive physical therapy on her lumbar spine as well as therapy on both shoulders.  She is 71 years old.  I have injected both shoulders in the subacromial outlet with steroids.  She is going to physical therapy regularly and said dry needling as well.  She says her right shoulder still painful and weak to her and she feels like the right shoulder is much worse than the left and is not getting any better in spite of conservative treatment including activity modification, anti-inflammatories, steroid injections and physical therapy.  Her right shoulder does move smoothly and fluidly but shows some weakness in the rotator cuff and definite signs of impingement.  Her left shoulder is moving better with some pain but not nearly the same pain and weakness that she has on the right shoulder.  Her low back pain to the left side seems to be improving.  She did let me know that she is getting a puppy soon and wants to be able to do a lot of activities with that dog.  At this point given the failure conservative treatment mainly on the right shoulder we need to obtain a MRI of the right shoulder to assess the rotator cuff and rule out any other pathology that is causing her the significant pain and weakness with the right shoulder.  We will see her in follow-up once we have an MRI.  She agrees with this treatment plan.  All questions and concerns were answered and addressed.

## 2021-12-30 ENCOUNTER — Ambulatory Visit: Payer: Medicare HMO | Admitting: Orthopaedic Surgery

## 2022-01-13 ENCOUNTER — Other Ambulatory Visit: Payer: Self-pay

## 2022-01-13 ENCOUNTER — Ambulatory Visit
Admission: RE | Admit: 2022-01-13 | Discharge: 2022-01-13 | Disposition: A | Discharge status: Home / Self Care | Payer: Medicare HMO | Source: Ambulatory Visit | Attending: Orthopaedic Surgery | Admitting: Orthopaedic Surgery

## 2022-01-13 DIAGNOSIS — G8929 Other chronic pain: Secondary | ICD-10-CM

## 2022-01-13 DIAGNOSIS — M25511 Pain in right shoulder: Secondary | ICD-10-CM | POA: Diagnosis not present

## 2022-01-19 ENCOUNTER — Encounter: Payer: Self-pay | Admitting: Orthopaedic Surgery

## 2022-01-19 ENCOUNTER — Ambulatory Visit: Payer: Medicare HMO | Admitting: Orthopaedic Surgery

## 2022-01-19 ENCOUNTER — Other Ambulatory Visit: Payer: Self-pay

## 2022-01-19 DIAGNOSIS — M25512 Pain in left shoulder: Secondary | ICD-10-CM

## 2022-01-19 DIAGNOSIS — M25511 Pain in right shoulder: Secondary | ICD-10-CM | POA: Diagnosis not present

## 2022-01-19 DIAGNOSIS — M19011 Primary osteoarthritis, right shoulder: Secondary | ICD-10-CM

## 2022-01-19 DIAGNOSIS — G8929 Other chronic pain: Secondary | ICD-10-CM

## 2022-01-19 DIAGNOSIS — M12811 Other specific arthropathies, not elsewhere classified, right shoulder: Secondary | ICD-10-CM

## 2022-01-19 NOTE — Progress Notes (Signed)
The patient is well-known to me.  She comes in today to go over MRI of her right shoulder.  She does have chronic bilateral shoulder pain with weakness as well.  It does definitely affect her actives daily living in terms of just the weakness with overhead activities with both shoulders.  She is a prediabetic.  She denies any specific injuries.  She has had other joint replacements before.  Examination of both shoulders show that she is able to abduct and reach overhead but there is obvious weakness in both shoulders with the right worse than the left.  Both shoulders are painful to do this activities.  The MRI of the right shoulder does show chronic tearing of the rotator cuff with retraction all the way back to the glenoid.  There is muscle atrophy and the humeral head is high riding showing that this is rotator cuff arthropathy.  I gave her a copy of the MRI report and I did show her a model of a shoulder to explain what is going on.  She will continue to work on her range of motion since she does have good range of motion and work on some strengthening exercises.  She does wish to try a one-time steroid injection in both shoulders which would be in the glenohumeral joint under radiographic guidance.  We we will put in the order for Dr. Ernestina Patches to put steroid injections in both shoulder joints.  I will then see her in follow-up to see how that is done for her.

## 2022-01-22 IMAGING — DX DG KNEE 1-2V PORT*L*
2 series · 2 of 2 positions shown · non-contrast
Comparison: None.

CLINICAL DATA: Left knee arthroplasty.

EXAM:
PORTABLE LEFT KNEE - 1-2 VIEW

[knee ap]
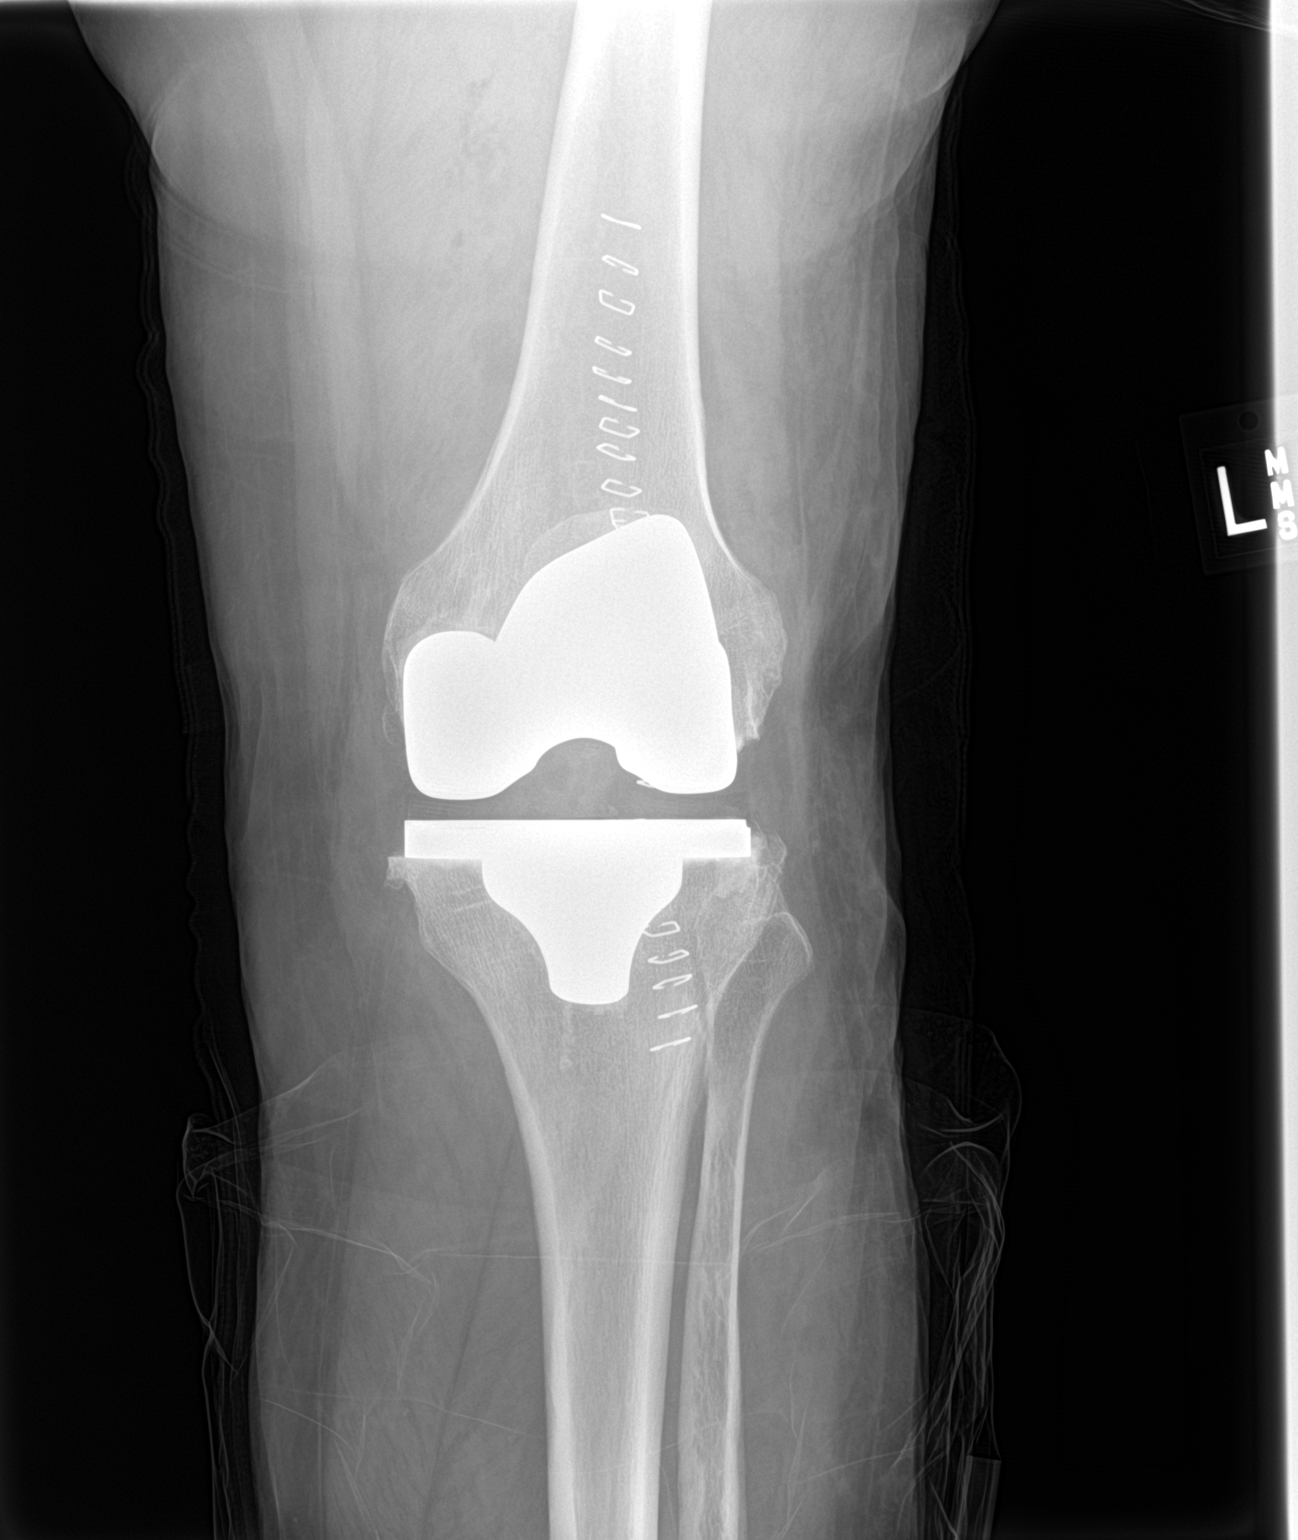

[knee lat]
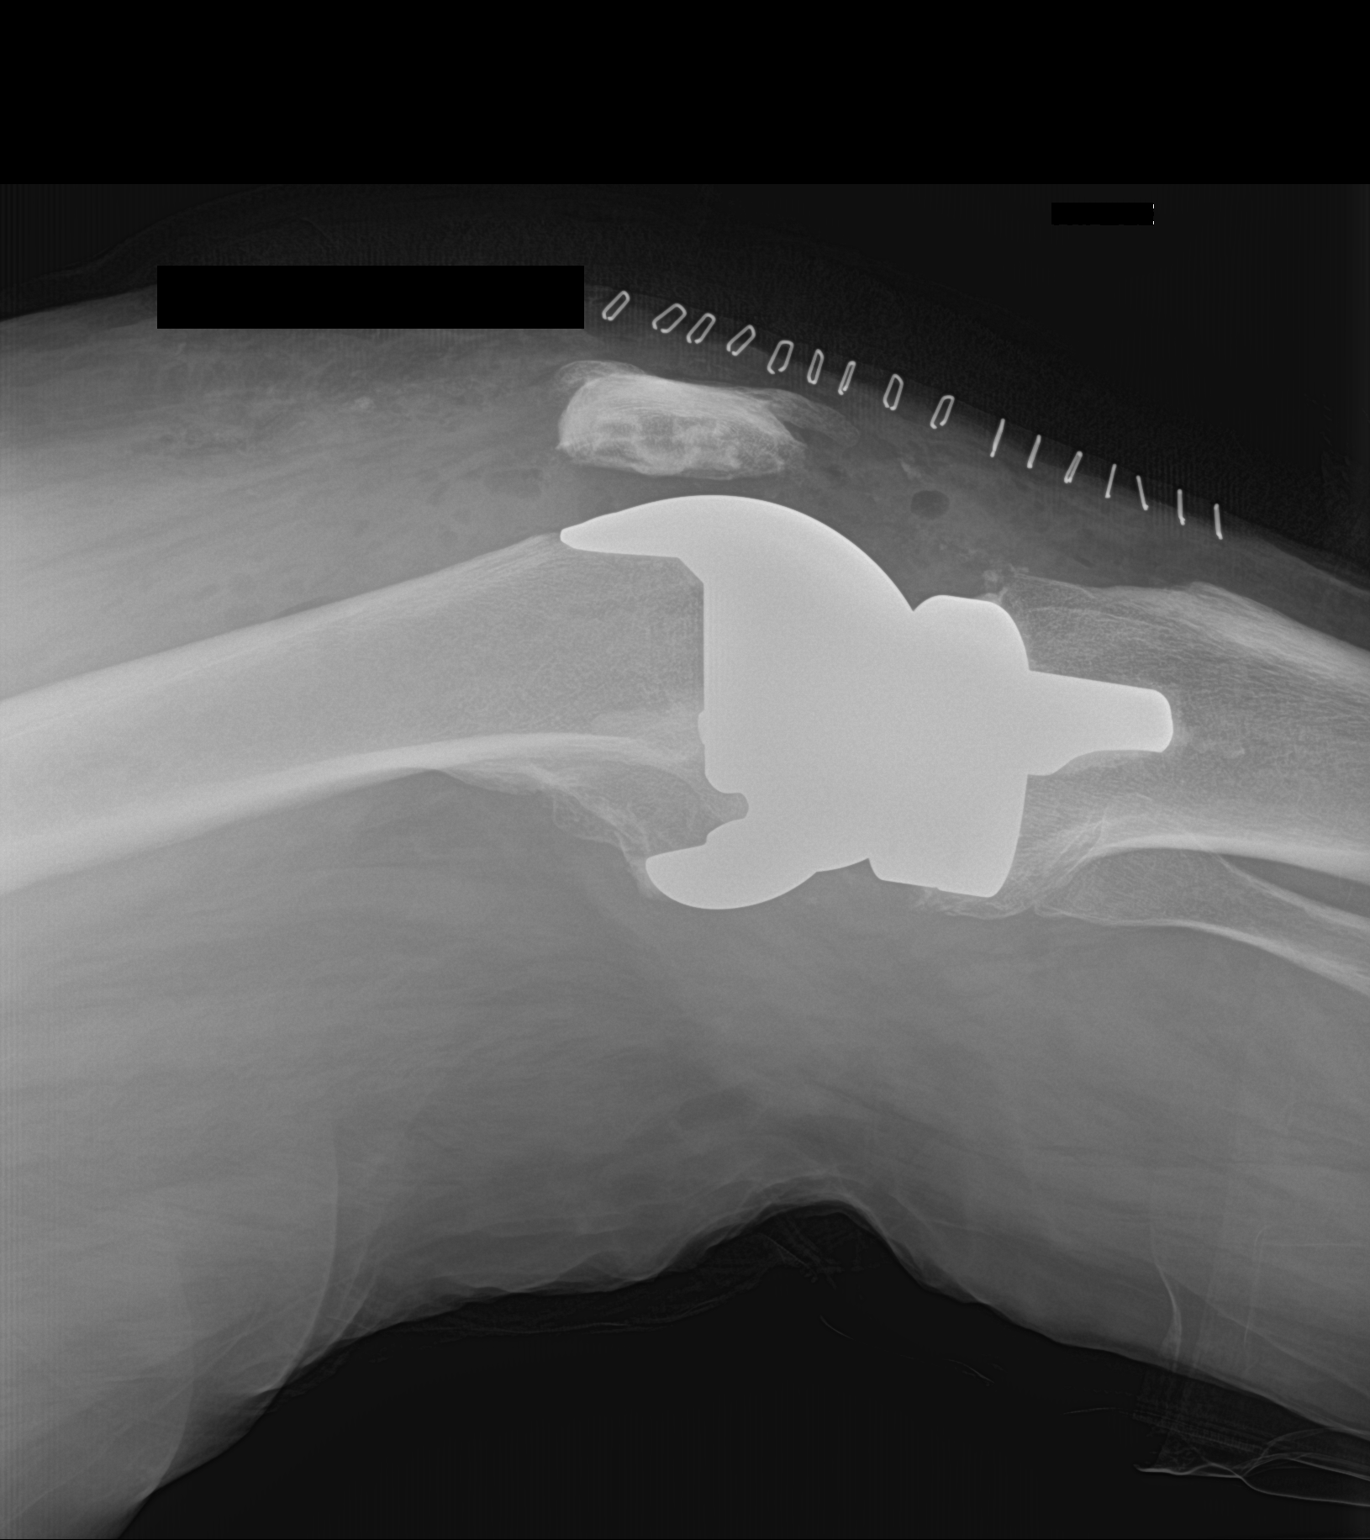

[2 of 2 positions shown; findings below may reference images not displayed]

FINDINGS: Total left knee arthroplasty in expected alignment. There has been
patellar resurfacing. No periprosthetic lucency or fracture. Recent
postsurgical change includes air and edema in the joint space and
soft tissues. Anterior skin staples
IMPRESSION: Left knee arthroplasty without immediate postoperative complication.

## 2022-02-03 ENCOUNTER — Other Ambulatory Visit: Payer: Self-pay

## 2022-02-03 ENCOUNTER — Encounter: Payer: Self-pay | Admitting: Physical Medicine and Rehabilitation

## 2022-02-03 ENCOUNTER — Ambulatory Visit: Payer: Self-pay

## 2022-02-03 ENCOUNTER — Ambulatory Visit: Payer: Medicare HMO | Admitting: Physical Medicine and Rehabilitation

## 2022-02-03 DIAGNOSIS — M25512 Pain in left shoulder: Secondary | ICD-10-CM | POA: Diagnosis not present

## 2022-02-03 DIAGNOSIS — M25511 Pain in right shoulder: Secondary | ICD-10-CM

## 2022-02-03 DIAGNOSIS — G8929 Other chronic pain: Secondary | ICD-10-CM | POA: Diagnosis not present

## 2022-02-03 NOTE — Progress Notes (Signed)
Pt state pain in both shoulder. Pt state any movement or lifting makes her pain worse. Pt state she takes over the counter pain meds to help ease her pain. ? ?Numeric Pain Rating Scale and Functional Assessment ?Average Pain 2 ? ? ?In the last MONTH (on 0-10 scale) has pain interfered with the following? ? ?1. General activity like being  able to carry out your everyday physical activities such as walking, climbing stairs, carrying groceries, or moving a chair?  ?Rating(93) ? ? ?-BT, -Dye Allergies. ?' ?

## 2022-02-03 NOTE — Progress Notes (Signed)
? ?  Melissa [name] - 71 y.o. female MRN [id number]  Date of birth: December 08, 1950 ? ?Office Visit Note: ?Visit Date: 02/03/2022 ?PCP: Marda Stalker, PA-C ?Referred by: Marda Stalker, PA-C ? ?Subjective: ?Chief Complaint  ?Patient presents with  ? Right Shoulder - Pain  ? Left Shoulder - Pain  ? ?HPI:  Melissa [name] is a 70 y.o. female who comes in today at the request of Dr. Jean Rosenthal for planned Bilateral anesthetic glenohumeral arthrogram with fluoroscopic guidance.  The patient has failed conservative care including home exercise, medications, time and activity modification.  This injection will be diagnostic and hopefully therapeutic.  Please see requesting physician notes for further details and justification.  ? ?ROS Otherwise per HPI. ? ?Assessment & Plan: ?Visit Diagnoses:  ?  ICD-10-CM   ?1. Chronic pain of both shoulders  M25.511 Large Joint Inj: bilateral glenohumeral  ? G89.29 XR C-ARM NO REPORT  ? M25.512   ?  ?  ?Plan: No additional findings.  ? ?Meds & Orders: No orders of the defined types were placed in this encounter. ?  ?Orders Placed This Encounter  ?Procedures  ? Large Joint Inj: bilateral glenohumeral  ? XR C-ARM NO REPORT  ?  ?Follow-up: Return if symptoms worsen or fail to improve.  ? ?Procedures: ?Large Joint Inj: bilateral glenohumeral on 02/03/2022 2:49 PM ?Indications: pain and diagnostic evaluation ?Details: 22 G 3.5 in needle, fluoroscopy-guided anteromedial approach ? ?Arthrogram: No ? ?Medications (Right): 40 mg triamcinolone acetonide 40 MG/ML; 5 mL bupivacaine 0.25 % ?Medications (Left): 40 mg triamcinolone acetonide 40 MG/ML; 5 mL bupivacaine 0.25 % ?Outcome: tolerated well, no immediate complications ? ?There was excellent flow of contrast producing a partial arthrogram of the glenohumeral joint. The patient did have relief of symptoms during the anesthetic phase of the injection. ?Procedure, treatment alternatives, risks and benefits explained, specific risks  discussed. Consent was given by the patient. Immediately prior to procedure a time out was called to verify the correct patient, procedure, equipment, support staff and site/side marked as required. Patient was prepped and draped in the usual sterile fashion.  ? ?  ?   ? ?Clinical History: ?No specialty comments available.  ? ? ? ?Objective:  VS:  HT:    WT:   BMI:     BP:   HR: bpm  TEMP: ( )  RESP:  ?Physical Exam  ? ?Imaging: ?No results found. ?

## 2022-02-07 MED ORDER — TRIAMCINOLONE ACETONIDE 40 MG/ML IJ SUSP
40.0000 mg | INTRAMUSCULAR | Status: AC | PRN
  Administered 2022-02-03: 40 mg via INTRA_ARTICULAR

## 2022-02-07 MED ORDER — BUPIVACAINE HCL 0.25 % IJ SOLN
5.0000 mL | INTRAMUSCULAR | Status: AC | PRN
  Administered 2022-02-03: 5 mL via INTRA_ARTICULAR

## 2022-02-16 ENCOUNTER — Ambulatory Visit: Payer: Medicare HMO | Admitting: Orthopaedic Surgery

## 2022-02-16 ENCOUNTER — Encounter: Payer: Self-pay | Admitting: Orthopaedic Surgery

## 2022-02-16 DIAGNOSIS — G8929 Other chronic pain: Secondary | ICD-10-CM

## 2022-02-16 DIAGNOSIS — M25512 Pain in left shoulder: Secondary | ICD-10-CM

## 2022-02-16 DIAGNOSIS — M25511 Pain in right shoulder: Secondary | ICD-10-CM | POA: Diagnosis not present

## 2022-02-16 DIAGNOSIS — M12811 Other specific arthropathies, not elsewhere classified, right shoulder: Secondary | ICD-10-CM

## 2022-02-16 NOTE — Progress Notes (Signed)
The patient comes in today after having intra-articular injections in both shoulder joints with a steroid under fluoroscopy by Dr. Ernestina Patches.  She says the injections have helped quite a bit.  We did MRI her right shoulder which showed rotator cuff arthropathy.  We have not MRI of the left shoulder but I told her we would do that if it gets where her pain is bad enough.  She says she still has some pain in her shoulders but is just much better overall. ? ?Surprisingly she has good mobility with both shoulders with good range of motion and strength in spite of her deficits and findings on MRI of the right shoulder. ? ?She does have a history of knee replacements.  She says the knees are painful when she is down on them on her knees but otherwise they are doing well. ? ?At this point follow-up can be as needed.  If the shoulder is worse and her at all she knows to let us know. ?

## 2022-02-26 DIAGNOSIS — U071 COVID-19: Secondary | ICD-10-CM | POA: Diagnosis not present

## 2022-04-08 DIAGNOSIS — R6883 Chills (without fever): Secondary | ICD-10-CM | POA: Diagnosis not present

## 2022-04-08 DIAGNOSIS — R051 Acute cough: Secondary | ICD-10-CM | POA: Diagnosis not present

## 2022-04-08 DIAGNOSIS — R5383 Other fatigue: Secondary | ICD-10-CM | POA: Diagnosis not present

## 2022-04-08 DIAGNOSIS — U071 COVID-19: Secondary | ICD-10-CM | POA: Diagnosis not present

## 2022-04-08 DIAGNOSIS — R519 Headache, unspecified: Secondary | ICD-10-CM | POA: Diagnosis not present

## 2022-04-22 DIAGNOSIS — L821 Other seborrheic keratosis: Secondary | ICD-10-CM | POA: Diagnosis not present

## 2022-04-22 DIAGNOSIS — Z129 Encounter for screening for malignant neoplasm, site unspecified: Secondary | ICD-10-CM | POA: Diagnosis not present

## 2022-04-22 DIAGNOSIS — D2271 Melanocytic nevi of right lower limb, including hip: Secondary | ICD-10-CM | POA: Diagnosis not present

## 2022-04-22 DIAGNOSIS — D692 Other nonthrombocytopenic purpura: Secondary | ICD-10-CM | POA: Diagnosis not present

## 2022-06-02 ENCOUNTER — Other Ambulatory Visit: Payer: Self-pay | Admitting: Family Medicine

## 2022-06-02 DIAGNOSIS — E039 Hypothyroidism, unspecified: Secondary | ICD-10-CM | POA: Diagnosis not present

## 2022-06-02 DIAGNOSIS — Z0000 Encounter for general adult medical examination without abnormal findings: Secondary | ICD-10-CM | POA: Diagnosis not present

## 2022-06-02 DIAGNOSIS — M170 Bilateral primary osteoarthritis of knee: Secondary | ICD-10-CM | POA: Diagnosis not present

## 2022-06-02 DIAGNOSIS — E2839 Other primary ovarian failure: Secondary | ICD-10-CM | POA: Diagnosis not present

## 2022-06-02 DIAGNOSIS — R7303 Prediabetes: Secondary | ICD-10-CM | POA: Diagnosis not present

## 2022-06-02 DIAGNOSIS — I10 Essential (primary) hypertension: Secondary | ICD-10-CM | POA: Diagnosis not present

## 2022-06-02 DIAGNOSIS — E559 Vitamin D deficiency, unspecified: Secondary | ICD-10-CM | POA: Diagnosis not present

## 2022-06-02 DIAGNOSIS — M8588 Other specified disorders of bone density and structure, other site: Secondary | ICD-10-CM | POA: Diagnosis not present

## 2022-06-02 DIAGNOSIS — E785 Hyperlipidemia, unspecified: Secondary | ICD-10-CM | POA: Diagnosis not present

## 2022-06-02 DIAGNOSIS — Z6834 Body mass index (BMI) 34.0-34.9, adult: Secondary | ICD-10-CM | POA: Diagnosis not present

## 2022-06-16 ENCOUNTER — Encounter: Payer: Self-pay | Admitting: Orthopaedic Surgery

## 2022-06-16 ENCOUNTER — Other Ambulatory Visit: Payer: Self-pay

## 2022-06-16 ENCOUNTER — Ambulatory Visit: Payer: Medicare HMO | Admitting: Orthopaedic Surgery

## 2022-06-16 DIAGNOSIS — G8929 Other chronic pain: Secondary | ICD-10-CM

## 2022-06-16 DIAGNOSIS — M79642 Pain in left hand: Secondary | ICD-10-CM

## 2022-06-16 DIAGNOSIS — M12811 Other specific arthropathies, not elsewhere classified, right shoulder: Secondary | ICD-10-CM | POA: Diagnosis not present

## 2022-06-16 DIAGNOSIS — M5442 Lumbago with sciatica, left side: Secondary | ICD-10-CM

## 2022-06-16 NOTE — Progress Notes (Signed)
Office Visit Note   Patient: Melissa [name]           Date of Birth: [birth year]           MRN: [id number] Visit Date: 06/16/2022              Requested by: Marda Stalker, PA-C [place],  [place] [id number] PCP: Marda Stalker, PA-C   Assessment & Plan: Visit Diagnoses:  1. Rotator cuff arthropathy, right   2. Chronic left-sided low back pain with left-sided sciatica   3. Pain in left hand     Plan: We will send her for repeat intra-articular injections of both shoulders with Dr. Ernestina Patches.  Also obtain an MRI of her left shoulder to evaluate for internal derangement/rotator cuff tear.  In regards to her left hand numbness tingling we will obtain EMG nerve conduction studies of the upper extremities to rule out carpal tunnel syndrome source of her numbness tingling.  Again she has some numbness tingling in the right hand but not the degree that she has in the left.  Follow-up after the EMG nerve conduction studies and MRI left shoulder to go over results and discuss further treatment.  Questions were encouraged and answered at length today.  Follow-Up Instructions: Return for After EMG/NCS.   Orders:  No orders of the defined types were placed in this encounter.  No orders of the defined types were placed in this encounter.     Procedures: No procedures performed   Clinical Data: No additional findings.   Subjective: Chief Complaint  Patient presents with   Left Shoulder - Pain   Right Shoulder - Pain    HPI Melissa [name] comes in today for bilateral shoulder pain.  She has deficient right rotator cuff with arthropathy.  She states currently her left shoulder is bothering her more than right.  She states she has pain in the right shoulder when lifting anything above her head but when she gets her arms above her head she has no significant pain in the right shoulder.  Left shoulder pain however is achy pain that is constant.  She notes that she has  pain with overhead activities also over the left shoulder.  Pain in the shoulder does awaken her.  She underwent bilateral intra-articular injections of both shoulders by Dr. Ernestina Patches on 02/03/2022 and got a decent relief with this but she is unsure for how long.  She has had no new injury to either shoulder.  She is asking about getting injections in both shoulders again and also an MRI of the left shoulder due to the significant pain.  She is diabetic and reports a hemoglobin A1c of 6.1. Bilateral hand numbness and tingling this been ongoing for some time left greater than right.  She describes numbness in the left hand involving the thumb index long finger and ring finger. Review of Systems   Objective: Vital Signs: There were no vitals taken for this visit.  Physical Exam Constitutional:      Appearance: She is not ill-appearing or diaphoretic.  Pulmonary:     Effort: Pulmonary effort is normal.  Neurological:     Mental Status: She is alert and oriented to person, place, and time.  Psychiatric:        Mood and Affect: Mood normal.     Ortho Exam Bilateral shoulders: Full overhead activity.  Weakness with external rotation of the right shoulder against resistance.  Internal rotation 5/5 bilaterally.  Impingement testing  negative on the left.  Empty can test is positive on the right negative on the left. Bilateral upper extremities: Motor full sensation bilateral hands.  Negative Tinel's and compression test over the median nerve at the wrist bilaterally.  Positive Phalen's on the left only.  Specialty Comments:  No specialty comments available.  Imaging: No results found.   PMFS History: Patient Active Problem List   Diagnosis Date Noted   Rotator cuff arthropathy, right 01/19/2022   Status post total left knee replacement 06/21/2020   Status post total right knee replacement 03/08/2020   Pain in right foot 07/05/2017   Chronic pain of right knee 07/05/2017   Chronic pain of  left knee 07/05/2017   Unilateral primary osteoarthritis, left knee 07/05/2017   Unilateral primary osteoarthritis, right knee 07/05/2017   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, right 04/14/2017   Past Medical History:  Diagnosis Date   Arthritis    osteo arthritis   Hypertension    Hypothyroidism    Pneumonia    Pre-diabetes    Takes metformin   Walking pneumonia     Family History  Problem Relation Age of Onset   Breast cancer Maternal Aunt        in 33's    Past Surgical History:  Procedure Laterality Date   JOINT REPLACEMENT     MENISCUS REPAIR     right knee   MULTIPLE TOOTH EXTRACTIONS     with implants   ovaries removed     2003   TONSILLECTOMY     TOTAL KNEE ARTHROPLASTY Right 03/08/2020   Procedure: RIGHT TOTAL KNEE ARTHROPLASTY AND LEFT KNEE STEROID INJECTION;  Surgeon: Mcarthur Rossetti, MD;  Location: [facility];  Service: Orthopedics;  Laterality: Right;   TOTAL KNEE ARTHROPLASTY Left 06/21/2020   Procedure: LEFT TOTAL KNEE ARTHROPLASTY;  Surgeon: Mcarthur Rossetti, MD;  Location: [facility];  Service: Orthopedics;  Laterality: Left;   Social History   Occupational History    Employer: [employer]  Tobacco Use   Smoking status: Former    Years: 10.00    Types: Cigarettes   Smokeless tobacco: Never   Tobacco comments:    quit when 71 years old  Vaping Use   Vaping Use: Never used  Substance and Sexual Activity   Alcohol use: Yes    Alcohol/week: 2.0 standard drinks of alcohol    Types: 1 Glasses of wine, 1 Shots of liquor per week    Comment: daily   Drug use: Never   Sexual activity: Not Currently    Birth control/protection: None

## 2022-06-17 ENCOUNTER — Telehealth: Payer: Self-pay | Admitting: Physical Medicine and Rehabilitation

## 2022-06-17 NOTE — Telephone Encounter (Signed)
Patient called needing a call back to schedule an appointment with Dr. Ernestina Patches     [phone]

## 2022-06-24 ENCOUNTER — Other Ambulatory Visit: Payer: Self-pay | Admitting: Family Medicine

## 2022-06-24 DIAGNOSIS — Z1231 Encounter for screening mammogram for malignant neoplasm of breast: Secondary | ICD-10-CM

## 2022-07-07 ENCOUNTER — Telehealth: Payer: Self-pay | Admitting: Orthopaedic Surgery

## 2022-07-07 NOTE — Telephone Encounter (Signed)
Called patient left message to return call to schedule an appointment with Dr. Ninfa Linden for MRI review

## 2022-07-08 ENCOUNTER — Encounter: Payer: Medicare HMO | Admitting: Physical Medicine and Rehabilitation

## 2022-07-09 ENCOUNTER — Encounter: Payer: Medicare HMO | Admitting: Physical Medicine and Rehabilitation

## 2022-07-14 ENCOUNTER — Ambulatory Visit
Admission: RE | Admit: 2022-07-14 | Discharge: 2022-07-14 | Disposition: A | Discharge status: Home / Self Care | Payer: Medicare HMO | Source: Ambulatory Visit | Attending: Physician Assistant | Admitting: Physician Assistant

## 2022-07-14 DIAGNOSIS — M19012 Primary osteoarthritis, left shoulder: Secondary | ICD-10-CM | POA: Diagnosis not present

## 2022-07-14 DIAGNOSIS — G8929 Other chronic pain: Secondary | ICD-10-CM

## 2022-07-14 DIAGNOSIS — M25412 Effusion, left shoulder: Secondary | ICD-10-CM | POA: Diagnosis not present

## 2022-07-14 DIAGNOSIS — M75122 Complete rotator cuff tear or rupture of left shoulder, not specified as traumatic: Secondary | ICD-10-CM | POA: Diagnosis not present

## 2022-07-16 ENCOUNTER — Ambulatory Visit (INDEPENDENT_AMBULATORY_CARE_PROVIDER_SITE_OTHER): Payer: Medicare HMO | Admitting: Physical Medicine and Rehabilitation

## 2022-07-16 ENCOUNTER — Encounter: Payer: Self-pay | Admitting: Physical Medicine and Rehabilitation

## 2022-07-16 ENCOUNTER — Ambulatory Visit: Payer: Self-pay

## 2022-07-16 DIAGNOSIS — M25512 Pain in left shoulder: Secondary | ICD-10-CM

## 2022-07-16 DIAGNOSIS — G8929 Other chronic pain: Secondary | ICD-10-CM | POA: Diagnosis not present

## 2022-07-16 DIAGNOSIS — M25511 Pain in right shoulder: Secondary | ICD-10-CM | POA: Diagnosis not present

## 2022-07-16 DIAGNOSIS — R202 Paresthesia of skin: Secondary | ICD-10-CM

## 2022-07-16 DIAGNOSIS — M12811 Other specific arthropathies, not elsewhere classified, right shoulder: Secondary | ICD-10-CM | POA: Diagnosis not present

## 2022-07-16 NOTE — Progress Notes (Signed)
Pt state pain in both shoulder. Pt state any movement or lifting makes her pain worse. Pt state she takes over the counter pain meds to help ease her pain.  Numeric Pain Rating Scale and Functional Assessment Average Pain 7   In the last MONTH (on 0-10 scale) has pain interfered with the following?  1. General activity like being  able to carry out your everyday physical activities such as walking, climbing stairs, carrying groceries, or moving a chair?  Rating(10)    -BT, -Dye Allergies.

## 2022-07-16 NOTE — Progress Notes (Signed)
[name] - 71 y.o. female MRN [id number]  Date of birth: 04-21-1951  Office Visit Note: Visit Date: 07/16/2022 PCP: Marda Stalker, PA-C Referred by: Pete Pelt, PA-C  Subjective: Chief Complaint  Patient presents with   Right Shoulder - Pain   Left Shoulder - Pain   HPI:  Melissa [name] is a 71 y.o. female who comes in today for evaluation and management at the request of Dr. Jean Rosenthal and Benita Stabile, PA-C with 2 distinct issues concerning the bilateral shoulders with rotator cuff arthropathy and numbness and tingling in the bilateral hands.  We completed glenohumeral joint injection with fluoroscopic guidance back in March for some relief.  She has failed all manner of conservative care otherwise with that including therapy and medications and subacromial injection.  Today in terms of her shoulders which is really managed by Dr. Ninfa Linden we did go ahead and complete bilateral intra-articular shoulder injections with fluoroscopic guidance.  In terms of her hand numbness she reports Bilateral symptoms left somewhat more than right but bilateral.  She is left-handed.  No prior electrodiagnostic studies.  When she saw Benita Stabile, PA-C and last time it was mostly left-sided symptoms with increasing shoulder pain on the left.  No frank radicular symptoms.  She does get paresthesias more of the radial side of the hands bilaterally.  She does get symptoms worse at night and worse with using the hands.  This has been chronic worsening symptoms for a while now.  She has worn braces in the past.   Review of Systems  Musculoskeletal:  Positive for joint pain.  Neurological:  Positive for tingling.  All other systems reviewed and are negative.  Otherwise per HPI.  Assessment & Plan: Visit Diagnoses:    ICD-10-CM   1. Chronic pain of both shoulders  M25.511 Large Joint Inj: bilateral glenohumeral   G89.29 XR C-ARM NO REPORT   M25.512     2. Rotator cuff arthropathy,  right  M12.811     3. Paresthesia of skin  R20.2       Plan: Findings:  1.  Bilateral shoulder pain and bilateral rotator cuff arthropathy status post prior glenohumeral joint injections of fluoroscopic guidance with good relief temporarily.  We will repeat that today and she will follow-up with Dr. Ninfa Linden.  2.  Bilateral numbness and tingling in the hands consistent with carpal tunnel syndrome.  We will go ahead and schedule her for electrodiagnostic studies of both hands.    Meds & Orders: No orders of the defined types were placed in this encounter.   Orders Placed This Encounter  Procedures   Large Joint Inj: bilateral glenohumeral   XR C-ARM NO REPORT    Follow-up: Return for Bilateral upper extremity nerve studies..   Procedures: Large Joint Inj: bilateral glenohumeral on 07/16/2022 2:20 PM Indications: pain and diagnostic evaluation Details: 22 G 3.5 in needle, fluoroscopy-guided anteromedial approach  Arthrogram: No  Medications (Right): 40 mg triamcinolone acetonide 40 MG/ML; 5 mL bupivacaine 0.25 % Medications (Left): 40 mg triamcinolone acetonide 40 MG/ML; 5 mL bupivacaine 0.25 % Outcome: tolerated well, no immediate complications  There was excellent flow of contrast producing a partial arthrogram of the glenohumeral joint. The patient did have relief of symptoms during the anesthetic phase of the injection. Procedure, treatment alternatives, risks and benefits explained, specific risks discussed. Consent was given by the patient. Immediately prior to procedure a time out was called to verify the correct patient, procedure, equipment, support staff  and site/side marked as required. Patient was prepped and draped in the usual sterile fashion.          Clinical History: No specialty comments available.     Objective:  VS:  HT:    WT:   BMI:     BP:   HR: bpm  TEMP: ( )  RESP:  Physical Exam Vitals and nursing note reviewed.  Constitutional:       General: She is not in acute distress.    Appearance: Normal appearance. She is not ill-appearing.  HENT:     Head: Normocephalic and atraumatic.     Right Ear: External ear normal.     Left Ear: External ear normal.  Eyes:     Extraocular Movements: Extraocular movements intact.  Cardiovascular:     Rate and Rhythm: Normal rate.     Pulses: Normal pulses.  Pulmonary:     Effort: Pulmonary effort is normal. No respiratory distress.  Abdominal:     General: There is no distension.     Palpations: Abdomen is soft.  Musculoskeletal:        General: No swelling, tenderness or deformity.     Cervical back: Neck supple.     Right lower leg: No edema.     Left lower leg: No edema.     Comments: Inspection reveals flattening of the left more than right APB but no atrophy of the bilateral FDI or hand intrinsics. There is no swelling, color changes, allodynia or dystrophic changes. There is 5 out of 5 strength in the bilateral wrist extension, finger abduction and long finger flexion. There is intact sensation to light touch in all dermatomal and peripheral nerve distributions.  There is a positive Phalen's test bilaterally. There is a negative Hoffmann's test bilaterally.  Skin:    General: Skin is warm and dry.     Findings: No erythema, lesion or rash.  Neurological:     General: No focal deficit present.     Mental Status: She is alert and oriented to person, place, and time.     Sensory: No sensory deficit.     Motor: No weakness or abnormal muscle tone.     Coordination: Coordination normal.  Psychiatric:        Mood and Affect: Mood normal.        Behavior: Behavior normal.      Imaging: No results found.

## 2022-07-22 ENCOUNTER — Ambulatory Visit: Payer: Medicare HMO | Admitting: Orthopaedic Surgery

## 2022-07-22 ENCOUNTER — Ambulatory Visit (INDEPENDENT_AMBULATORY_CARE_PROVIDER_SITE_OTHER): Payer: Medicare HMO

## 2022-07-22 ENCOUNTER — Encounter: Payer: Self-pay | Admitting: Physical Medicine and Rehabilitation

## 2022-07-22 ENCOUNTER — Ambulatory Visit (INDEPENDENT_AMBULATORY_CARE_PROVIDER_SITE_OTHER): Payer: Medicare HMO | Admitting: Physical Medicine and Rehabilitation

## 2022-07-22 ENCOUNTER — Encounter: Payer: Self-pay | Admitting: Orthopaedic Surgery

## 2022-07-22 DIAGNOSIS — Z96653 Presence of artificial knee joint, bilateral: Secondary | ICD-10-CM | POA: Diagnosis not present

## 2022-07-22 DIAGNOSIS — M25562 Pain in left knee: Secondary | ICD-10-CM

## 2022-07-22 DIAGNOSIS — M25512 Pain in left shoulder: Secondary | ICD-10-CM

## 2022-07-22 DIAGNOSIS — R202 Paresthesia of skin: Secondary | ICD-10-CM

## 2022-07-22 DIAGNOSIS — G5603 Carpal tunnel syndrome, bilateral upper limbs: Secondary | ICD-10-CM

## 2022-07-22 DIAGNOSIS — G8929 Other chronic pain: Secondary | ICD-10-CM | POA: Diagnosis not present

## 2022-07-22 MED ORDER — BUPIVACAINE HCL 0.25 % IJ SOLN
5.0000 mL | INTRAMUSCULAR | Status: AC | PRN
  Administered 2022-07-16: 5 mL via INTRA_ARTICULAR

## 2022-07-22 MED ORDER — TRIAMCINOLONE ACETONIDE 40 MG/ML IJ SUSP
40.0000 mg | INTRAMUSCULAR | Status: AC | PRN
  Administered 2022-07-16: 40 mg via INTRA_ARTICULAR

## 2022-07-22 NOTE — Progress Notes (Signed)
The patient comes in today to go over an MRI of her left shoulder.  We have MRI her right shoulder this year and her right shoulder showed a full-thickness retracted rotator cuff tear and rotator cuff arthropathy.  The left shoulder MRI also shows full-thickness retracted rotator cuff tear.  Since she has had the MRI she did have a steroid injection in both shoulder joints by Dr. Ernestina Patches under fluoroscopy and this is helped.  She said the pain is mild and she is not interested in shoulder replacement surgery as of yet.  However she did let me know during this visit her left knee has been bothering her.  We replaced that knee 2 years ago and actually replaced her right knee also 2 years ago.  She had some pain recently going up and down stairs and was really favoring the left knee.  Examination of the left knee today shows no effusion.  There is no warmth or redness.  Her range of motion is full of that knee and it feels ligamentously stable.  X-rays of the left knee show normal-appearing total knee arthroplasty in good alignment with no complicating features.  She did have EMG studies of her bilateral upper extremities today.  Dr. Ernestina Patches did send me a note saying that she does show moderate to severe carpal tunnel syndrome bilaterally.  Her symptoms are mild and she only gets numbness and tingling at night.  We will go and have her try bilateral carpal tunnel splints to wear only at night.  We talked about the potential for surgery in the future for carpal tunnel syndrome but I will think she is there yet.  I gave her reassurance that her knee replacement looks good.  I would like to see her back in 3 months to see how she is doing overall from her carpal tunnel syndrome as well as her shoulders and knees but no x-rays are needed at that visit.

## 2022-07-22 NOTE — Progress Notes (Signed)
Pt state both hands falls a sleep at night.  Pt state she feels some tingling in her hands. 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 Pt state she left handed.  Numeric Pain Rating Scale and Functional Assessment Average Pain 2   In the last MONTH (on 0-10 scale) has pain interfered with the following?  1. General activity like being  able to carry out your everyday physical activities such as walking, climbing stairs, carrying groceries, or moving a chair?  Rating(8)   -BT,

## 2022-07-23 NOTE — Procedures (Signed)
EMG & NCV Findings: Evaluation of the left median motor and the right median motor nerves showed prolonged distal onset latency (L6.5, R6.9 ms) and decreased conduction velocity (Elbow-Wrist, L40, R45 m/s).  The left median (across palm) sensory and the right median (across palm) sensory nerves showed prolonged distal peak latency (Wrist, L7.6, R7.3 ms) and prolonged distal peak latency (Palm, L3.1, R3.5 ms).  The left ulnar sensory nerve showed prolonged distal peak latency (3.8 ms) and decreased conduction velocity (Wrist-5th Digit, 37 m/s).  All remaining nerves (as indicated in the following tables) were within normal limits.  All left vs. right side differences were within normal limits.    All examined muscles (as indicated in the following table) showed no evidence of electrical instability.    Impression: The above electrodiagnostic study is ABNORMAL and reveals evidence of a moderate to severe bilateral median nerve entrapment at the wrist (carpal tunnel syndrome) affecting sensory and motor components. There is no significant electrodiagnostic evidence of any other focal nerve entrapment, brachial plexopathy or cervical radiculopathy.   Recommendations: 1.  Follow-up with referring physician. 2.  Continue current management of symptoms. 3.  Suggest surgical evaluation.  ___________________________ Laurence Spates FAAPMR Board Certified, American Board of Physical Medicine and Rehabilitation    Nerve Conduction Studies Anti Sensory Summary Table   Stim Site NR Peak (ms) Norm Peak (ms) P-T Amp (V) Norm P-T Amp Site1 Site2 Delta-P (ms) Dist (cm) Vel (m/s) Norm Vel (m/s)  Left Median Acr Palm Anti Sensory (2nd Digit)  29C  Wrist    *7.6 <3.6 14.1 >10 Wrist Palm 4.5 0.0    Palm    *3.1 <2.0 13.5         Right Median Acr Palm Anti Sensory (2nd Digit)  29.5C  Wrist    *7.3 <3.6 18.4 >10 Wrist Palm 3.8 0.0    Palm    *3.5 <2.0 13.8         Left Radial Anti Sensory (Base 1st Digit)   29.8C  Wrist    2.2 <3.1 30.4  Wrist Base 1st Digit 2.2 0.0    Left Ulnar Anti Sensory (5th Digit)  30.1C  Wrist    *3.8 <3.7 17.5 >15.0 Wrist 5th Digit 3.8 14.0 *37 >38   Motor Summary Table   Stim Site NR Onset (ms) Norm Onset (ms) O-P Amp (mV) Norm O-P Amp Site1 Site2 Delta-0 (ms) Dist (cm) Vel (m/s) Norm Vel (m/s)  Left Median Motor (Abd Poll Brev)  29.9C  Wrist    *6.5 <4.2 5.8 >5 Elbow Wrist 5.1 20.5 *40 >50  Elbow    11.6  3.1         Right Median Motor (Abd Poll Brev)  29.6C  Wrist    *6.9 <4.2 5.2 >5 Elbow Wrist 4.6 20.5 *45 >50  Elbow    11.5  5.1         Left Ulnar Motor (Abd Dig Min)  30.1C  Wrist    3.1 <4.2 11.9 >3 B Elbow Wrist 3.3 18.0 55 >53  B Elbow    6.4  11.6  A Elbow B Elbow 1.1 9.0 82 >53  A Elbow    7.5  10.9          EMG   Side Muscle Nerve Root Ins Act Fibs Psw Amp Dur Poly Recrt Int Fraser Din Comment  Left Abd Poll Brev Median C8-T1 Nml Nml Nml Nml Nml 0 Nml Nml   Left 1stDorInt Ulnar C8-T1 Nml Nml Nml Nml  Nml 0 Nml Nml   Left PronatorTeres Median C6-7 Nml Nml Nml Nml Nml 0 Nml Nml   Left Biceps Musculocut C5-6 Nml Nml Nml Nml Nml 0 Nml Nml   Left Deltoid Axillary C5-6 Nml Nml Nml Nml Nml 0 Nml Nml     Nerve Conduction Studies Anti Sensory Left/Right Comparison   Stim Site L Lat (ms) R Lat (ms) L-R Lat (ms) L Amp (V) R Amp (V) L-R Amp (%) Site1 Site2 L Vel (m/s) R Vel (m/s) L-R Vel (m/s)  Median Acr Palm Anti Sensory (2nd Digit)  29C  Wrist *7.6 *7.3 0.3 14.1 18.4 23.4 Wrist Palm     Palm *3.1 *3.5 0.4 13.5 13.8 2.2       Radial Anti Sensory (Base 1st Digit)  29.8C  Wrist 2.2   30.4   Wrist Base 1st Digit     Ulnar Anti Sensory (5th Digit)  30.1C  Wrist *3.8   17.5   Wrist 5th Digit *37     Motor Left/Right Comparison   Stim Site L Lat (ms) R Lat (ms) L-R Lat (ms) L Amp (mV) R Amp (mV) L-R Amp (%) Site1 Site2 L Vel (m/s) R Vel (m/s) L-R Vel (m/s)  Median Motor (Abd Poll Brev)  29.9C  Wrist *6.5 *6.9 0.4 5.8 5.2 10.3 Elbow Wrist *40 *45 5   Elbow 11.6 11.5 0.1 3.1 5.1 39.2       Ulnar Motor (Abd Dig Min)  30.1C  Wrist 3.1   11.9   B Elbow Wrist 55    B Elbow 6.4   11.6   A Elbow B Elbow 82    A Elbow 7.5   10.9            Waveforms:

## 2022-07-23 NOTE — Progress Notes (Signed)
Melissa [name] - 71 y.o. female MRN [id number]  Date of birth: December 16, 1950  Office Visit Note: Visit Date: 07/22/2022 PCP: Marda Stalker, PA-C Referred by: Pete Pelt, PA-C  Subjective: Chief Complaint  Patient presents with   Left Hand - Pain   Right Hand - Pain   HPI:  Melissa [name] is a 71 y.o. female who comes in today at the request of Dr. Jean Rosenthal for electrodiagnostic study of the Bilateral upper extremities.  Patient is Right hand dominant.  Chronic long-term numbness and tingling of both hands particularly with nighttime symptoms and positive flick sign.  She reports that her hands feel like they "go to sleep ".  She has had no prior electrodiagnostic studies.  She reports symptoms can come and go at times but has been worse more recently but somewhat better recently as well.  Reports the symptoms in the hands are not nearly as bad as a symptoms in the shoulders.  She does have some neck pain.  No frank radicular symptoms.  No prior electrodiagnostic studies.   ROS Otherwise per HPI.  Assessment & Plan: Visit Diagnoses:    ICD-10-CM   1. Paresthesia of skin  R20.2 NCV with EMG (electromyography)      Plan: Impression: The above electrodiagnostic study is ABNORMAL and reveals evidence of a moderate to severe bilateral median nerve entrapment at the wrist (carpal tunnel syndrome) affecting sensory and motor components. There is no significant electrodiagnostic evidence of any other focal nerve entrapment, brachial plexopathy or cervical radiculopathy.   Recommendations: 1.  Follow-up with referring physician. 2.  Continue current management of symptoms. 3.  Suggest surgical evaluation.  Meds & Orders: No orders of the defined types were placed in this encounter.   Orders Placed This Encounter  Procedures   NCV with EMG (electromyography)    Follow-up: Return for Jean Rosenthal, MD as scheduled.   Procedures: No procedures performed   EMG & NCV Findings: Evaluation of the left median motor and the right median motor nerves showed prolonged distal onset latency (L6.5, R6.9 ms) and decreased conduction velocity (Elbow-Wrist, L40, R45 m/s).  The left median (across palm) sensory and the right median (across palm) sensory nerves showed prolonged distal peak latency (Wrist, L7.6, R7.3 ms) and prolonged distal peak latency (Palm, L3.1, R3.5 ms).  The left ulnar sensory nerve showed prolonged distal peak latency (3.8 ms) and decreased conduction velocity (Wrist-5th Digit, 37 m/s).  All remaining nerves (as indicated in the following tables) were within normal limits.  All left vs. right side differences were within normal limits.    All examined muscles (as indicated in the following table) showed no evidence of electrical instability.    Impression: The above electrodiagnostic study is ABNORMAL and reveals evidence of a moderate to severe bilateral median nerve entrapment at the wrist (carpal tunnel syndrome) affecting sensory and motor components. There is no significant electrodiagnostic evidence of any other focal nerve entrapment, brachial plexopathy or cervical radiculopathy.   Recommendations: 1.  Follow-up with referring physician. 2.  Continue current management of symptoms. 3.  Suggest surgical evaluation.  ___________________________ Laurence Spates FAAPMR Board Certified, American Board of Physical Medicine and Rehabilitation    Nerve Conduction Studies Anti Sensory Summary Table   Stim Site NR Peak (ms) Norm Peak (ms) P-T Amp (V) Norm P-T Amp Site1 Site2 Delta-P (ms) Dist (cm) Vel (m/s) Norm Vel (m/s)  Left Median Acr Palm Anti Sensory (2nd Digit)  29C  Wrist    *  7.6 <3.6 14.1 >10 Wrist Palm 4.5 0.0    Palm    *3.1 <2.0 13.5         Right Median Acr Palm Anti Sensory (2nd Digit)  29.5C  Wrist    *7.3 <3.6 18.4 >10 Wrist Palm 3.8 0.0    Palm    *3.5 <2.0 13.8         Left Radial Anti Sensory (Base 1st Digit)   29.8C  Wrist    2.2 <3.1 30.4  Wrist Base 1st Digit 2.2 0.0    Left Ulnar Anti Sensory (5th Digit)  30.1C  Wrist    *3.8 <3.7 17.5 >15.0 Wrist 5th Digit 3.8 14.0 *37 >38   Motor Summary Table   Stim Site NR Onset (ms) Norm Onset (ms) O-P Amp (mV) Norm O-P Amp Site1 Site2 Delta-0 (ms) Dist (cm) Vel (m/s) Norm Vel (m/s)  Left Median Motor (Abd Poll Brev)  29.9C  Wrist    *6.5 <4.2 5.8 >5 Elbow Wrist 5.1 20.5 *40 >50  Elbow    11.6  3.1         Right Median Motor (Abd Poll Brev)  29.6C  Wrist    *6.9 <4.2 5.2 >5 Elbow Wrist 4.6 20.5 *45 >50  Elbow    11.5  5.1         Left Ulnar Motor (Abd Dig Min)  30.1C  Wrist    3.1 <4.2 11.9 >3 B Elbow Wrist 3.3 18.0 55 >53  B Elbow    6.4  11.6  A Elbow B Elbow 1.1 9.0 82 >53  A Elbow    7.5  10.9          EMG   Side Muscle Nerve Root Ins Act Fibs Psw Amp Dur Poly Recrt Int Fraser Din Comment  Left Abd Poll Brev Median C8-T1 Nml Nml Nml Nml Nml 0 Nml Nml   Left 1stDorInt Ulnar C8-T1 Nml Nml Nml Nml Nml 0 Nml Nml   Left PronatorTeres Median C6-7 Nml Nml Nml Nml Nml 0 Nml Nml   Left Biceps Musculocut C5-6 Nml Nml Nml Nml Nml 0 Nml Nml   Left Deltoid Axillary C5-6 Nml Nml Nml Nml Nml 0 Nml Nml     Nerve Conduction Studies Anti Sensory Left/Right Comparison   Stim Site L Lat (ms) R Lat (ms) L-R Lat (ms) L Amp (V) R Amp (V) L-R Amp (%) Site1 Site2 L Vel (m/s) R Vel (m/s) L-R Vel (m/s)  Median Acr Palm Anti Sensory (2nd Digit)  29C  Wrist *7.6 *7.3 0.3 14.1 18.4 23.4 Wrist Palm     Palm *3.1 *3.5 0.4 13.5 13.8 2.2       Radial Anti Sensory (Base 1st Digit)  29.8C  Wrist 2.2   30.4   Wrist Base 1st Digit     Ulnar Anti Sensory (5th Digit)  30.1C  Wrist *3.8   17.5   Wrist 5th Digit *37     Motor Left/Right Comparison   Stim Site L Lat (ms) R Lat (ms) L-R Lat (ms) L Amp (mV) R Amp (mV) L-R Amp (%) Site1 Site2 L Vel (m/s) R Vel (m/s) L-R Vel (m/s)  Median Motor (Abd Poll Brev)  29.9C  Wrist *6.5 *6.9 0.4 5.8 5.2 10.3 Elbow Wrist *40 *45 5   Elbow 11.6 11.5 0.1 3.1 5.1 39.2       Ulnar Motor (Abd Dig Min)  30.1C  Wrist 3.1   11.9   B Elbow Wrist 55  B Elbow 6.4   11.6   A Elbow B Elbow 82    A Elbow 7.5   10.9            Waveforms:                Clinical History: No specialty comments available.     Objective:  VS:  HT:    WT:   BMI:     BP:   HR: bpm  TEMP: ( )  RESP:  Physical Exam Musculoskeletal:        General: No swelling, tenderness or deformity.     Comments: Inspection reveals some flattening of the bilateral APB but no atrophy of the bilateral APB or FDI or hand intrinsics. There is no swelling, color changes, allodynia or dystrophic changes. There is 5 out of 5 strength in the bilateral wrist extension, finger abduction and long finger flexion. There is intact sensation to light touch in all dermatomal and peripheral nerve distributions.  There is a positive Phalen's test bilaterally. There is a negative Hoffmann's test bilaterally.  Skin:    General: Skin is warm and dry.     Findings: No erythema or rash.  Neurological:     General: No focal deficit present.     Mental Status: She is alert and oriented to person, place, and time.     Motor: No weakness or abnormal muscle tone.     Coordination: Coordination normal.  Psychiatric:        Mood and Affect: Mood normal.        Behavior: Behavior normal.      Imaging: No results found.

## 2022-08-17 ENCOUNTER — Telehealth: Payer: Self-pay | Admitting: Orthopaedic Surgery

## 2022-08-17 NOTE — Telephone Encounter (Signed)
Pt wondering if she can get another referral to see [facility] for her back

## 2022-08-17 NOTE — Telephone Encounter (Signed)
Ok to schedule her for a office visit with you?

## 2022-08-18 ENCOUNTER — Ambulatory Visit: Payer: Medicare HMO | Admitting: Physical Medicine and Rehabilitation

## 2022-08-18 ENCOUNTER — Encounter: Payer: Self-pay | Admitting: Physical Medicine and Rehabilitation

## 2022-08-18 DIAGNOSIS — M5442 Lumbago with sciatica, left side: Secondary | ICD-10-CM

## 2022-08-18 DIAGNOSIS — M48062 Spinal stenosis, lumbar region with neurogenic claudication: Secondary | ICD-10-CM | POA: Diagnosis not present

## 2022-08-18 DIAGNOSIS — M5416 Radiculopathy, lumbar region: Secondary | ICD-10-CM | POA: Diagnosis not present

## 2022-08-18 DIAGNOSIS — M48061 Spinal stenosis, lumbar region without neurogenic claudication: Secondary | ICD-10-CM

## 2022-08-18 DIAGNOSIS — G8929 Other chronic pain: Secondary | ICD-10-CM

## 2022-08-18 NOTE — Progress Notes (Unsigned)
Numeric Pain Rating Scale and Functional Assessment Average Pain 0 LBP ( Left Sided) Radiates down from left buttock down thigh.  In the last MONTH (on 0-10 scale) has pain interfered with the following?  1. General activity like being  able to carry out your everyday physical activities such as walking, climbing stairs, carrying groceries, or moving a chair?  Rating(6)   +Driver, -BT, -Dye Allergies.

## 2022-08-18 NOTE — Progress Notes (Unsigned)
Melissa [name] - 71 y.o. female MRN [id number]  Date of birth: [birth year]  Office Visit Note: Visit Date: 08/18/2022 PCP: Marda Stalker, PA-C Referred by: Marda Stalker, PA-C  Subjective: Chief Complaint  Patient presents with   Lower Back - Pain   HPI: Melissa [name] is a 71 y.o. female who comes in today for evaluation of chronic, worsening and severe bilateral lower back pain radiating to left buttock and down left lateral leg to knee. Pain ongoing for several years, worsened over the last few months. Her pain is exacerbated by standing and walking, describes as sore and aching sensation, currently rates as 8 out of 10. Some relief of pain with home exercise regimen, rest and use of medications. History of formal physical therapy/dry needling with our in house team, reports good relief with these treatments. Lumbar MRI imaging from 2019 exhibits moderate to severe spinal canal stenosis noted at L4-L5, left foraminal herniation impinging on the L5 nerve at the level of L5-S1. History of multiple lumbar epidural steroid injections performed in our office over the years with significant and sustained relief of pain, most recent left L5 transforaminal epidural steroid injection on 08/07/2021. Patient states she has plans to travel out of state on 10/12, would like injection before she leaves for trip. Patient denies focal weakness, numbness and tingling. Patient denies recent trauma or falls.      Review of Systems  Musculoskeletal:  Positive for back pain.  Neurological:  Negative for tingling, sensory change, focal weakness and weakness.  All other systems reviewed and are negative.  Otherwise per HPI.  Assessment & Plan: Visit Diagnoses:    ICD-10-CM   1. Chronic bilateral low back pain with left-sided sciatica  M54.42 Ambulatory referral to Physical Medicine Rehab   G89.29 CANCELED: Ambulatory referral to Physical Medicine Rehab    2. Lumbar radiculopathy  M54.16  Ambulatory referral to Physical Medicine Rehab    CANCELED: Ambulatory referral to Physical Medicine Rehab    3. Spinal stenosis of lumbar region with neurogenic claudication  M48.062 Ambulatory referral to Physical Medicine Rehab    CANCELED: Ambulatory referral to Physical Medicine Rehab    4. Foraminal stenosis of lumbar region  M48.061 Ambulatory referral to Physical Medicine Rehab    CANCELED: Ambulatory referral to Physical Medicine Rehab       Plan: Findings:  Chronic, worsening and severe chronic, worsening and severe bilateral lower back pain radiating to left buttock and down left lateral leg to knee. Patient continues to have severe pain despite good conservative therapies such as formal physical therapy, home exercise regimen, rest and use of medications. Patients clinical presentation and exam are consistent with neurogenic claudication as a result of spinal canal stenosis. There is moderate to advanced spinal canal stenosis at the level of L4-L5. Next step is to repeat left L5 transforaminal epidural steroid injection under fluoroscopic guidance. If pain persists post injection we would consider performing transforaminal injection at levels of L4 and L5 on the left. We will do our best to get patient in before her trip in October. No red flag symptoms noted upon exam today.     Meds & Orders: No orders of the defined types were placed in this encounter.   Orders Placed This Encounter  Procedures   Ambulatory referral to Physical Medicine Rehab    Follow-up: Return for Left L5 transforaminal epidural steroid injection.   Procedures: No procedures performed      Clinical History: EXAM: MRI LUMBAR SPINE  WITHOUT CONTRAST   TECHNIQUE: Multiplanar, multisequence MR imaging of the lumbar spine was performed. No intravenous contrast was administered.   COMPARISON:  Lumbar radiography 09/14/2018   FINDINGS: Segmentation: 5 lumbar type vertebral bodies based on  comparison radiography   Alignment: There is mild scoliosis. Grade 1 anterolisthesis at L4-5.   Vertebrae:  No fracture, evidence of discitis, or bone lesion.   Conus medullaris and cauda equina: Conus extends to the L1 level. Conus and cauda equina appear normal.   Paraspinal and other soft tissues: Negative   Disc levels:   T12- L1: Unremarkable.   L1-L2: Disc narrowing and circumferential bulging with endplate spur. Mild facet spurring.   L2-L3: Disc narrowing and bulging asymmetric to the left. Mild facet hypertrophy. No impingement   L3-L4: Mild facet spurring. Disc narrowing and circumferential bulging. Mild spinal stenosis.   L4-L5: Advanced facet arthropathy with spurring and anterolisthesis. The disc is narrowed and bulging preferentially towards the right. Degenerative changes and dorsal epidural fat expansion causes moderate to advanced spinal stenosis. There is right foraminal impingement with L4 flattening.   L5-S1:Facet degeneration with moderate spurring. Mild disc narrowing and bulging. There is a left foraminal herniation impinging on the L5 nerve root-see sagittal sequences   IMPRESSION: 1. Disc and facet degeneration with L4-5 anterolisthesis and mild scoliosis. 2. On the symptomatic left side there is L5 foraminal impingement due to disc protrusion. 3. L4-5 moderate to advanced spinal stenosis. Right foraminal impingement.     Electronically Signed   By: Monte Fantasia M.D.   On: 09/29/2018 12:49   She reports that she has quit smoking. She has never used smokeless tobacco. No results for input(s): "HGBA1C", "LABURIC" in the last 8760 hours.  Objective:  VS:  HT:    WT:   BMI:     BP:   HR: bpm  TEMP: ( )  RESP:  Physical Exam Vitals and nursing note reviewed.  HENT:     Head: Normocephalic and atraumatic.     Right Ear: External ear normal.     Left Ear: External ear normal.     Nose: Nose normal.     Mouth/Throat:     Mouth:  Mucous membranes are moist.  Eyes:     Extraocular Movements: Extraocular movements intact.  Cardiovascular:     Rate and Rhythm: Normal rate.     Pulses: Normal pulses.  Pulmonary:     Effort: Pulmonary effort is normal.  Abdominal:     General: Abdomen is flat. There is no distension.  Musculoskeletal:        General: Tenderness present.     Cervical back: Normal range of motion.     Comments: Pt rises from seated position to standing without difficulty. Good lumbar range of motion. Strong distal strength without clonus, no pain upon palpation of greater trochanters. Sensation intact bilaterally. Walks independently, gait steady.   Skin:    General: Skin is warm and dry.     Capillary Refill: Capillary refill takes less than 2 seconds.  Neurological:     General: No focal deficit present.     Mental Status: She is alert and oriented to person, place, and time.  Psychiatric:        Mood and Affect: Mood normal.        Behavior: Behavior normal.     Ortho Exam  Imaging: No results found.  Past Medical/Family/Surgical/Social History: Medications & Allergies reviewed per EMR, new medications updated. Patient Active Problem List  Diagnosis Date Noted   Rotator cuff arthropathy, right 01/19/2022   Status post total left knee replacement 06/21/2020   Status post total right knee replacement 03/08/2020   Pain in right foot 07/05/2017   Chronic pain of right knee 07/05/2017   Chronic pain of left knee 07/05/2017   Unilateral primary osteoarthritis, left knee 07/05/2017   Unilateral primary osteoarthritis, right knee 07/05/2017   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, right 04/14/2017   Past Medical History:  Diagnosis Date   Arthritis    osteo arthritis   Hypertension    Hypothyroidism    Pneumonia    Pre-diabetes    Takes metformin   Walking pneumonia    Family History  Problem Relation Age of Onset   Breast cancer Maternal Aunt        in  63's   Past Surgical History:  Procedure Laterality Date   JOINT REPLACEMENT     MENISCUS REPAIR     right knee   MULTIPLE TOOTH EXTRACTIONS     with implants   ovaries removed     2003   TONSILLECTOMY     TOTAL KNEE ARTHROPLASTY Right 03/08/2020   Procedure: RIGHT TOTAL KNEE ARTHROPLASTY AND LEFT KNEE STEROID INJECTION;  Surgeon: Mcarthur Rossetti, MD;  Location: [facility];  Service: Orthopedics;  Laterality: Right;   TOTAL KNEE ARTHROPLASTY Left 06/21/2020   Procedure: LEFT TOTAL KNEE ARTHROPLASTY;  Surgeon: Mcarthur Rossetti, MD;  Location: [facility];  Service: Orthopedics;  Laterality: Left;   Social History   Occupational History    Employer: [employer]  Tobacco Use   Smoking status: Former    Years: 10.00    Types: Cigarettes   Smokeless tobacco: Never   Tobacco comments:    quit when 71 years old  Vaping Use   Vaping Use: Never used  Substance and Sexual Activity   Alcohol use: Yes    Alcohol/week: 2.0 standard drinks of alcohol    Types: 1 Glasses of wine, 1 Shots of liquor per week    Comment: daily   Drug use: Never   Sexual activity: Not Currently    Birth control/protection: None

## 2022-08-25 ENCOUNTER — Encounter: Payer: Medicare HMO | Admitting: Physical Medicine and Rehabilitation

## 2022-08-31 ENCOUNTER — Telehealth: Payer: Self-pay | Admitting: Orthopaedic Surgery

## 2022-08-31 NOTE — Telephone Encounter (Signed)
Pt called and states she was suppose to have a referral to have surgery on her shoulder   [phone]

## 2022-09-03 ENCOUNTER — Ambulatory Visit: Payer: Self-pay

## 2022-09-03 ENCOUNTER — Ambulatory Visit: Payer: Medicare HMO | Admitting: Physical Medicine and Rehabilitation

## 2022-09-03 VITALS — BP 137/76 | HR 72

## 2022-09-03 DIAGNOSIS — M5416 Radiculopathy, lumbar region: Secondary | ICD-10-CM

## 2022-09-03 MED ORDER — METHYLPREDNISOLONE ACETATE 80 MG/ML IJ SUSP
40.0000 mg | Freq: Once | INTRAMUSCULAR | Status: AC
  Administered 2022-09-03: 40 mg

## 2022-09-03 NOTE — Patient Instructions (Signed)

## 2022-09-03 NOTE — Progress Notes (Signed)
Numeric Pain Rating Scale and Functional Assessment Average Pain 3   In the last MONTH (on 0-10 scale) has pain interfered with the following?  1. General activity like being  able to carry out your everyday physical activities such as walking, climbing stairs, carrying groceries, or moving a chair?  Rating(5, Varies between 4-7)   +Driver, -BT, -Dye Allergies.  Pain is 7 on a bad day. Pain radiates in to left leg and groin. Advil for pain

## 2022-09-04 ENCOUNTER — Encounter: Payer: Self-pay | Admitting: Orthopedic Surgery

## 2022-09-04 ENCOUNTER — Ambulatory Visit (INDEPENDENT_AMBULATORY_CARE_PROVIDER_SITE_OTHER): Payer: Medicare HMO | Admitting: Orthopedic Surgery

## 2022-09-04 DIAGNOSIS — M25511 Pain in right shoulder: Secondary | ICD-10-CM

## 2022-09-06 NOTE — Progress Notes (Signed)
Office Visit Note   Patient: Melissa [name]           Date of Birth: Apr 23, 1951           MRN: [id number] Visit Date: 09/04/2022 Requested by: Marda Stalker, PA-C [place],  [place] [id number] PCP: Marda Stalker, PA-C  Subjective: Chief Complaint  Patient presents with   Right Shoulder - Pain    HPI: Melissa [name] is a 71 y.o. female who presents to the office reporting right shoulder pain.  Patient is left-hand dominant.  She states that she has more difficulty with functional strength and pain but she does have both.  Symptoms have been worse over the last few months.  Denies any prior shoulder surgery.  She has most of her symptoms when she tries to lift anything out away from her body on that right-hand side.  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  She enjoys cooking and gardening.  She actually had to cancel a trip due to her concerns over lifting a suitcase.  She describes popping in the right shoulder.  She also reports some left shoulder symptoms with pain radiating to the biceps.  MRI scan shows retracted supraspinatus and infraspinatus tearing with also tearing of the upper border of the subscap and medial subluxation of the biceps tendon on the right-hand side along with moderate glenohumeral joint arthritis..                ROS: All systems reviewed are negative as they relate to the chief complaint within the history of present illness.  Patient denies fevers or chills.  Assessment & Plan: Visit Diagnoses:  1. Right shoulder pain, unspecified chronicity     Plan: Impression is right shoulder pain with early rotator cuff arthropathy and medial subluxation of the biceps tendon.  Patient has fairly reasonable passive range of motion and is able to actively forward flex and AB duct just beyond 90 degrees but she does have a lot of weakness in these regions along with pain against any resistance.  Plan at this time is thin cut CT scan to be done  in January and we will likely aim for reverse shoulder replacement in March.  The risk and benefits of that procedure is discussed including not limited to infection nerve vessel damage incomplete pain relief dislocation as well as lifting restriction that she would have.  Patient understands risk benefits and wishes to proceed.  Plan for thin cut CT scan in January and we will reassess her clinically prior to surgery in March.  Follow-Up Instructions: No follow-ups on file.   Orders:  Orders Placed This Encounter  Procedures   CT SHOULDER RIGHT WO CONTRAST   No orders of the defined types were placed in this encounter.     Procedures: No procedures performed   Clinical Data: No additional findings.  Objective: Vital Signs: There were no vitals taken for this visit.  Physical Exam:  Constitutional: Patient appears well-developed HEENT:  Head: Normocephalic Eyes:EOM are normal Neck: Normal range of motion Cardiovascular: Normal rate Pulmonary/chest: Effort normal Neurologic: Patient is alert Skin: Skin is warm Psychiatric: Patient has normal mood and affect  Ortho Exam: Ortho exam demonstrates passive range of motion on the right at 90/90/180.  On the left 70/90/180.  She has functional deltoid.  No discrete AC joint tenderness on the right versus left.  No masses lymphadenopathy or skin changes noted in the shoulder girdle region.  Does have  weakness to external rotation on the right consistent with her known posterior superior rotator cuff deficiency.  Specialty Comments:  EXAM: MRI LUMBAR SPINE WITHOUT CONTRAST   TECHNIQUE: Multiplanar, multisequence MR imaging of the lumbar spine was performed. No intravenous contrast was administered.   COMPARISON:  Lumbar radiography 09/14/2018   FINDINGS: Segmentation: 5 lumbar type vertebral bodies based on comparison radiography   Alignment: There is mild scoliosis. Grade 1 anterolisthesis at L4-5.   Vertebrae:  No  fracture, evidence of discitis, or bone lesion.   Conus medullaris and cauda equina: Conus extends to the L1 level. Conus and cauda equina appear normal.   Paraspinal and other soft tissues: Negative   Disc levels:   T12- L1: Unremarkable.   L1-L2: Disc narrowing and circumferential bulging with endplate spur. Mild facet spurring.   L2-L3: Disc narrowing and bulging asymmetric to the left. Mild facet hypertrophy. No impingement   L3-L4: Mild facet spurring. Disc narrowing and circumferential bulging. Mild spinal stenosis.   L4-L5: Advanced facet arthropathy with spurring and anterolisthesis. The disc is narrowed and bulging preferentially towards the right. Degenerative changes and dorsal epidural fat expansion causes moderate to advanced spinal stenosis. There is right foraminal impingement with L4 flattening.   L5-S1:Facet degeneration with moderate spurring. Mild disc narrowing and bulging. There is a left foraminal herniation impinging on the L5 nerve root-see sagittal sequences   IMPRESSION: 1. Disc and facet degeneration with L4-5 anterolisthesis and mild scoliosis. 2. On the symptomatic left side there is L5 foraminal impingement due to disc protrusion. 3. L4-5 moderate to advanced spinal stenosis. Right foraminal impingement.     Electronically Signed   By: Monte Fantasia M.D.   On: 09/29/2018 12:49  Imaging: No results found.   PMFS History: Patient Active Problem List   Diagnosis Date Noted   Rotator cuff arthropathy, right 01/19/2022   Status post total left knee replacement 06/21/2020   Status post total right knee replacement 03/08/2020   Pain in right foot 07/05/2017   Chronic pain of right knee 07/05/2017   Chronic pain of left knee 07/05/2017   Unilateral primary osteoarthritis, left knee 07/05/2017   Unilateral primary osteoarthritis, right knee 07/05/2017   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, right 04/14/2017    Past Medical History:  Diagnosis Date   Arthritis    osteo arthritis   Hypertension    Hypothyroidism    Pneumonia    Pre-diabetes    Takes metformin   Walking pneumonia     Family History  Problem Relation Age of Onset   Breast cancer Maternal Aunt        in 6's    Past Surgical History:  Procedure Laterality Date   JOINT REPLACEMENT     MENISCUS REPAIR     right knee   MULTIPLE TOOTH EXTRACTIONS     with implants   ovaries removed     2003   TONSILLECTOMY     TOTAL KNEE ARTHROPLASTY Right 03/08/2020   Procedure: RIGHT TOTAL KNEE ARTHROPLASTY AND LEFT KNEE STEROID INJECTION;  Surgeon: Mcarthur Rossetti, MD;  Location: [facility];  Service: Orthopedics;  Laterality: Right;   TOTAL KNEE ARTHROPLASTY Left 06/21/2020   Procedure: LEFT TOTAL KNEE ARTHROPLASTY;  Surgeon: Mcarthur Rossetti, MD;  Location: [facility];  Service: Orthopedics;  Laterality: Left;   Social History   Occupational History    Employer: [employer]  Tobacco Use   Smoking status: Former    Years: 10.00  Types: Cigarettes   Smokeless tobacco: Never   Tobacco comments:    quit when 71 years old  Vaping Use   Vaping Use: Never used  Substance and Sexual Activity   Alcohol use: Yes    Alcohol/week: 2.0 standard drinks of alcohol    Types: 1 Glasses of wine, 1 Shots of liquor per week    Comment: daily   Drug use: Never   Sexual activity: Not Currently    Birth control/protection: None

## 2022-09-14 NOTE — Progress Notes (Signed)
Melissa [name] - 71 y.o. female MRN [id number]  Date of birth: 07-Jan-1951  Office Visit Note: Visit Date: 09/03/2022 PCP: Marda Stalker, PA-C Referred by: Marda Stalker, PA-C  Subjective: Chief Complaint  Patient presents with   Lower Back - Pain   HPI:  Melissa [name] is a 71 y.o. female who comes in today at the request of Barnet Pall, FNP for planned Left L5-S1 Lumbar Transforaminal epidural steroid injection with fluoroscopic guidance.  The patient has failed conservative care including home exercise, medications, time and activity modification.  This injection will be diagnostic and hopefully therapeutic.  Please see requesting physician notes for further details and justification.   ROS Otherwise per HPI.  Assessment & Plan: Visit Diagnoses:    ICD-10-CM   1. Lumbar radiculopathy  M54.16 XR C-ARM NO REPORT    Epidural Steroid injection    methylPREDNISolone acetate (DEPO-MEDROL) injection 40 mg      Plan: No additional findings.   Meds & Orders:  Meds ordered this encounter  Medications   methylPREDNISolone acetate (DEPO-MEDROL) injection 40 mg    Orders Placed This Encounter  Procedures   XR C-ARM NO REPORT   Epidural Steroid injection    Follow-up: Return for visit to requesting provider as needed.   Procedures: No procedures performed  Lumbosacral Transforaminal Epidural Steroid Injection - Sub-Pedicular Approach with Fluoroscopic Guidance  Patient: Melissa [name]      Date of Birth: 27-Feb-1951 MRN: [id number] PCP: Marda Stalker, PA-C      Visit Date: 09/03/2022   Universal Protocol:    Date/Time: 09/03/2022  Consent Given By: the patient  Position: PRONE  Additional Comments: Vital signs were monitored before and after the procedure. Patient was prepped and draped in the usual sterile fashion. The correct patient, procedure, and site was verified.   Injection Procedure Details:   Procedure diagnoses: Lumbar radiculopathy  [M54.16]    Meds Administered:  Meds ordered this encounter  Medications   methylPREDNISolone acetate (DEPO-MEDROL) injection 40 mg    Laterality: Left  Location/Site: L5  Needle:5.0 in., 22 ga.  Short bevel or Quincke spinal needle  Needle Placement: Transforaminal  Findings:    -Comments: Excellent flow of contrast along the nerve, nerve root and into the epidural space.  Procedure Details: After squaring off the end-plates to get a true AP view, the C-arm was positioned so that an oblique view of the foramen as noted above was visualized. The target area is just inferior to the "nose of the scotty dog" or sub pedicular. The soft tissues overlying this structure were infiltrated with 2-3 ml. of 1% Lidocaine without Epinephrine.  The spinal needle was inserted toward the target using a "trajectory" view along the fluoroscope beam.  Under AP and lateral visualization, the needle was advanced so it did not puncture dura and was located close the 6 O'Clock position of the pedical in AP tracterory. Biplanar projections were used to confirm position. Aspiration was confirmed to be negative for CSF and/or blood. A 1-2 ml. volume of Isovue-250 was injected and flow of contrast was noted at each level. Radiographs were obtained for documentation purposes.   After attaining the desired flow of contrast documented above, a 0.5 to 1.0 ml test dose of 0.25% Marcaine was injected into each respective transforaminal space.  The patient was observed for 90 seconds post injection.  After no sensory deficits were reported, and normal lower extremity motor function was noted,   the above injectate was administered so  that equal amounts of the injectate were placed at each foramen (level) into the transforaminal epidural space.   Additional Comments:  The patient tolerated the procedure well Dressing: 2 x 2 sterile gauze and Band-Aid    Post-procedure details: Patient was observed during the  procedure. Post-procedure instructions were reviewed.  Patient left the clinic in stable condition.    Clinical History: EXAM: MRI LUMBAR SPINE WITHOUT CONTRAST   TECHNIQUE: Multiplanar, multisequence MR imaging of the lumbar spine was performed. No intravenous contrast was administered.   COMPARISON:  Lumbar radiography 09/14/2018   FINDINGS: Segmentation: 5 lumbar type vertebral bodies based on comparison radiography   Alignment: There is mild scoliosis. Grade 1 anterolisthesis at L4-5.   Vertebrae:  No fracture, evidence of discitis, or bone lesion.   Conus medullaris and cauda equina: Conus extends to the L1 level. Conus and cauda equina appear normal.   Paraspinal and other soft tissues: Negative   Disc levels:   T12- L1: Unremarkable.   L1-L2: Disc narrowing and circumferential bulging with endplate spur. Mild facet spurring.   L2-L3: Disc narrowing and bulging asymmetric to the left. Mild facet hypertrophy. No impingement   L3-L4: Mild facet spurring. Disc narrowing and circumferential bulging. Mild spinal stenosis.   L4-L5: Advanced facet arthropathy with spurring and anterolisthesis. The disc is narrowed and bulging preferentially towards the right. Degenerative changes and dorsal epidural fat expansion causes moderate to advanced spinal stenosis. There is right foraminal impingement with L4 flattening.   L5-S1:Facet degeneration with moderate spurring. Mild disc narrowing and bulging. There is a left foraminal herniation impinging on the L5 nerve root-see sagittal sequences   IMPRESSION: 1. Disc and facet degeneration with L4-5 anterolisthesis and mild scoliosis. 2. On the symptomatic left side there is L5 foraminal impingement due to disc protrusion. 3. L4-5 moderate to advanced spinal stenosis. Right foraminal impingement.     Electronically Signed   By: Monte Fantasia M.D.   On: 09/29/2018 12:49     Objective:  VS:  HT:    WT:   BMI:      BP:137/76  HR:72bpm  TEMP: ( )  RESP:  Physical Exam Vitals and nursing note reviewed.  Constitutional:      General: She is not in acute distress.    Appearance: Normal appearance. She is not ill-appearing.  HENT:     Head: Normocephalic and atraumatic.     Right Ear: External ear normal.     Left Ear: External ear normal.  Eyes:     Extraocular Movements: Extraocular movements intact.  Cardiovascular:     Rate and Rhythm: Normal rate.     Pulses: Normal pulses.  Pulmonary:     Effort: Pulmonary effort is normal. No respiratory distress.  Abdominal:     General: There is no distension.     Palpations: Abdomen is soft.  Musculoskeletal:        General: Tenderness present.     Cervical back: Neck supple.     Right lower leg: No edema.     Left lower leg: No edema.     Comments: Patient has good distal strength with no pain over the greater trochanters.  No clonus or focal weakness.  Skin:    Findings: No erythema, lesion or rash.  Neurological:     General: No focal deficit present.     Mental Status: She is alert and oriented to person, place, and time.     Sensory: No sensory deficit.  Motor: No weakness or abnormal muscle tone.     Coordination: Coordination normal.  Psychiatric:        Mood and Affect: Mood normal.        Behavior: Behavior normal.      Imaging: No results found.

## 2022-09-14 NOTE — Procedures (Signed)
Lumbosacral Transforaminal Epidural Steroid Injection - Sub-Pedicular Approach with Fluoroscopic Guidance  Patient: Melissa [name]      Date of Birth: 1951/11/16 MRN: [id number] PCP: Marda Stalker, PA-C      Visit Date: 09/03/2022   Universal Protocol:    Date/Time: 09/03/2022  Consent Given By: the patient  Position: PRONE  Additional Comments: Vital signs were monitored before and after the procedure. Patient was prepped and draped in the usual sterile fashion. The correct patient, procedure, and site was verified.   Injection Procedure Details:   Procedure diagnoses: Lumbar radiculopathy [M54.16]    Meds Administered:  Meds ordered this encounter  Medications   methylPREDNISolone acetate (DEPO-MEDROL) injection 40 mg    Laterality: Left  Location/Site: L5  Needle:5.0 in., 22 ga.  Short bevel or Quincke spinal needle  Needle Placement: Transforaminal  Findings:    -Comments: Excellent flow of contrast along the nerve, nerve root and into the epidural space.  Procedure Details: After squaring off the end-plates to get a true AP view, the C-arm was positioned so that an oblique view of the foramen as noted above was visualized. The target area is just inferior to the "nose of the scotty dog" or sub pedicular. The soft tissues overlying this structure were infiltrated with 2-3 ml. of 1% Lidocaine without Epinephrine.  The spinal needle was inserted toward the target using a "trajectory" view along the fluoroscope beam.  Under AP and lateral visualization, the needle was advanced so it did not puncture dura and was located close the 6 O'Clock position of the pedical in AP tracterory. Biplanar projections were used to confirm position. Aspiration was confirmed to be negative for CSF and/or blood. A 1-2 ml. volume of Isovue-250 was injected and flow of contrast was noted at each level. Radiographs were obtained for documentation purposes.   After attaining the desired  flow of contrast documented above, a 0.5 to 1.0 ml test dose of 0.25% Marcaine was injected into each respective transforaminal space.  The patient was observed for 90 seconds post injection.  After no sensory deficits were reported, and normal lower extremity motor function was noted,   the above injectate was administered so that equal amounts of the injectate were placed at each foramen (level) into the transforaminal epidural space.   Additional Comments:  The patient tolerated the procedure well Dressing: 2 x 2 sterile gauze and Band-Aid    Post-procedure details: Patient was observed during the procedure. Post-procedure instructions were reviewed.  Patient left the clinic in stable condition.

## 2022-09-17 DIAGNOSIS — K0822 Moderate atrophy of the mandible: Secondary | ICD-10-CM | POA: Diagnosis not present

## 2022-12-01 ENCOUNTER — Other Ambulatory Visit: Payer: Medicare HMO

## 2022-12-10 ENCOUNTER — Telehealth: Payer: Self-pay | Admitting: Orthopedic Surgery

## 2022-12-10 NOTE — Telephone Encounter (Signed)
Needs someone to call about her MRI denied and she hasn't received any information . Please advise

## 2022-12-11 DIAGNOSIS — E039 Hypothyroidism, unspecified: Secondary | ICD-10-CM | POA: Diagnosis not present

## 2022-12-11 DIAGNOSIS — R7303 Prediabetes: Secondary | ICD-10-CM | POA: Diagnosis not present

## 2022-12-11 NOTE — Telephone Encounter (Signed)
Can you call the patient and find out when she had a CT scan.  Last MRI scan was done in August.  I cannot find anywhere on care everywhere where any other imaging studies were done.  If she states that she has not had one since August then would you mind having Gabriel Cirri to call them back and say hey this lady's not had any type of imaging since August.  If she has had something done would you mind having [place] call and find out when is the earliest we can actually redo the CT scan.  Thanks

## 2022-12-14 ENCOUNTER — Ambulatory Visit
Admission: RE | Admit: 2022-12-14 | Discharge: 2022-12-14 | Disposition: A | Discharge status: Home / Self Care | Payer: Medicare HMO | Source: Ambulatory Visit | Attending: Family Medicine | Admitting: Family Medicine

## 2022-12-14 DIAGNOSIS — Z1231 Encounter for screening mammogram for malignant neoplasm of breast: Secondary | ICD-10-CM

## 2022-12-14 DIAGNOSIS — E2839 Other primary ovarian failure: Secondary | ICD-10-CM

## 2022-12-14 DIAGNOSIS — Z780 Asymptomatic menopausal state: Secondary | ICD-10-CM | POA: Diagnosis not present

## 2022-12-14 DIAGNOSIS — M85851 Other specified disorders of bone density and structure, right thigh: Secondary | ICD-10-CM | POA: Diagnosis not present

## 2022-12-16 DIAGNOSIS — R7303 Prediabetes: Secondary | ICD-10-CM | POA: Diagnosis not present

## 2022-12-16 DIAGNOSIS — E2839 Other primary ovarian failure: Secondary | ICD-10-CM | POA: Diagnosis not present

## 2022-12-16 DIAGNOSIS — E559 Vitamin D deficiency, unspecified: Secondary | ICD-10-CM | POA: Diagnosis not present

## 2022-12-16 DIAGNOSIS — F411 Generalized anxiety disorder: Secondary | ICD-10-CM | POA: Diagnosis not present

## 2022-12-16 DIAGNOSIS — I10 Essential (primary) hypertension: Secondary | ICD-10-CM | POA: Diagnosis not present

## 2022-12-16 DIAGNOSIS — M8588 Other specified disorders of bone density and structure, other site: Secondary | ICD-10-CM | POA: Diagnosis not present

## 2022-12-16 DIAGNOSIS — Z6834 Body mass index (BMI) 34.0-34.9, adult: Secondary | ICD-10-CM | POA: Diagnosis not present

## 2022-12-16 DIAGNOSIS — E039 Hypothyroidism, unspecified: Secondary | ICD-10-CM | POA: Diagnosis not present

## 2022-12-16 DIAGNOSIS — E785 Hyperlipidemia, unspecified: Secondary | ICD-10-CM | POA: Diagnosis not present

## 2022-12-16 DIAGNOSIS — R5383 Other fatigue: Secondary | ICD-10-CM | POA: Diagnosis not present

## 2022-12-21 NOTE — Telephone Encounter (Signed)
Will try again later today to see if can get a different person

## 2022-12-29 DIAGNOSIS — H43813 Vitreous degeneration, bilateral: Secondary | ICD-10-CM | POA: Diagnosis not present

## 2022-12-29 DIAGNOSIS — E119 Type 2 diabetes mellitus without complications: Secondary | ICD-10-CM | POA: Diagnosis not present

## 2022-12-29 DIAGNOSIS — H5213 Myopia, bilateral: Secondary | ICD-10-CM | POA: Diagnosis not present

## 2023-02-22 DIAGNOSIS — K0822 Moderate atrophy of the mandible: Secondary | ICD-10-CM | POA: Diagnosis not present

## 2023-04-28 DIAGNOSIS — D2372 Other benign neoplasm of skin of left lower limb, including hip: Secondary | ICD-10-CM | POA: Diagnosis not present

## 2023-04-28 DIAGNOSIS — L720 Epidermal cyst: Secondary | ICD-10-CM | POA: Diagnosis not present

## 2023-04-28 DIAGNOSIS — Z129 Encounter for screening for malignant neoplasm, site unspecified: Secondary | ICD-10-CM | POA: Diagnosis not present

## 2023-04-28 DIAGNOSIS — L84 Corns and callosities: Secondary | ICD-10-CM | POA: Diagnosis not present

## 2023-04-28 DIAGNOSIS — L821 Other seborrheic keratosis: Secondary | ICD-10-CM | POA: Diagnosis not present

## 2023-04-29 ENCOUNTER — Telehealth: Payer: Self-pay | Admitting: Physical Medicine and Rehabilitation

## 2023-04-29 DIAGNOSIS — M5416 Radiculopathy, lumbar region: Secondary | ICD-10-CM

## 2023-04-29 NOTE — Telephone Encounter (Signed)
Patient called needing to schedule an appointment with Dr. Alvester Morin for an injection in her back. The number to contact patient is [phone]

## 2023-04-30 ENCOUNTER — Telehealth: Payer: Self-pay | Admitting: Physical Medicine and Rehabilitation

## 2023-04-30 NOTE — Telephone Encounter (Signed)
Patient called back advised she is in a great deal of pain and asked if she could get a call back as soon as possible to schedule an appointment with Dr. Alvester Morin.   The number to contact patient is [phone]

## 2023-05-03 ENCOUNTER — Telehealth: Payer: Self-pay | Admitting: Physical Medicine and Rehabilitation

## 2023-05-03 NOTE — Telephone Encounter (Signed)
Patient consistently calling in for an injection please advise

## 2023-05-03 NOTE — Telephone Encounter (Signed)
Order for repeat injection placed 

## 2023-05-03 NOTE — Telephone Encounter (Signed)
See previous encounter

## 2023-05-03 NOTE — Telephone Encounter (Signed)
Spoke with patient and she is requesting an injection. Last injection was 08/2022 and she had complete relief until last week. She states it is the same pain she had before on the left side. No new falls, injuries or accidents. Please advise

## 2023-05-17 ENCOUNTER — Other Ambulatory Visit: Payer: Self-pay

## 2023-05-17 ENCOUNTER — Ambulatory Visit: Payer: Medicare HMO | Admitting: Physical Medicine and Rehabilitation

## 2023-05-17 DIAGNOSIS — M5416 Radiculopathy, lumbar region: Secondary | ICD-10-CM | POA: Diagnosis not present

## 2023-05-17 MED ORDER — METHYLPREDNISOLONE ACETATE 80 MG/ML IJ SUSP
80.0000 mg | Freq: Once | INTRAMUSCULAR | Status: AC
  Administered 2023-05-17: 80 mg

## 2023-05-17 NOTE — Progress Notes (Signed)
Melissa [name] - 72 y.o. female MRN [id number]  Date of birth: 1951-02-26  Office Visit Note: Visit Date: 05/17/2023 PCP: Jarrett Soho, PA-C Referred by: Jarrett Soho, PA-C  Subjective: Chief Complaint  Patient presents with   Lower Back - Pain   HPI:  Melissa [name] is a 72 y.o. female who comes in today at the request of Ellin Goodie, FNP for planned Left L5-S1 Lumbar Transforaminal epidural steroid injection with fluoroscopic guidance.  The patient has failed conservative care including home exercise, medications, time and activity modification.  This injection will be diagnostic and hopefully therapeutic.  Please see requesting physician notes for further details and justification.   ROS Otherwise per HPI.  Assessment & Plan: Visit Diagnoses:    ICD-10-CM   1. Lumbar radiculopathy  M54.16 XR C-ARM NO REPORT    Epidural Steroid injection    methylPREDNISolone acetate (DEPO-MEDROL) injection 80 mg      Plan: No additional findings.   Meds & Orders:  Meds ordered this encounter  Medications   methylPREDNISolone acetate (DEPO-MEDROL) injection 80 mg    Orders Placed This Encounter  Procedures   XR C-ARM NO REPORT   Epidural Steroid injection    Follow-up: Return for visit to requesting provider as needed.   Procedures: No procedures performed  Lumbosacral Transforaminal Epidural Steroid Injection - Sub-Pedicular Approach with Fluoroscopic Guidance  Patient: Melissa [name]      Date of Birth: 1950-12-19 MRN: [id number] PCP: Jarrett Soho, PA-C      Visit Date: 05/17/2023   Universal Protocol:    Date/Time: 05/17/2023  Consent Given By: the patient  Position: PRONE  Additional Comments: Vital signs were monitored before and after the procedure. Patient was prepped and draped in the usual sterile fashion. The correct patient, procedure, and site was verified.   Injection Procedure Details:   Procedure diagnoses: Lumbar radiculopathy  [M54.16]    Meds Administered:  Meds ordered this encounter  Medications   methylPREDNISolone acetate (DEPO-MEDROL) injection 80 mg    Laterality: Left  Location/Site: L5  Needle:5.0 in., 22 ga.  Short bevel or Quincke spinal needle  Needle Placement: Transforaminal  Findings:    -Comments: Excellent flow of contrast along the nerve, nerve root and into the epidural space.  Procedure Details: After squaring off the end-plates to get a true AP view, the C-arm was positioned so that an oblique view of the foramen as noted above was visualized. The target area is just inferior to the "nose of the scotty dog" or sub pedicular. The soft tissues overlying this structure were infiltrated with 2-3 ml. of 1% Lidocaine without Epinephrine.  The spinal needle was inserted toward the target using a "trajectory" view along the fluoroscope beam.  Under AP and lateral visualization, the needle was advanced so it did not puncture dura and was located close the 6 O'Clock position of the pedical in AP tracterory. Biplanar projections were used to confirm position. Aspiration was confirmed to be negative for CSF and/or blood. A 1-2 ml. volume of Isovue-250 was injected and flow of contrast was noted at each level. Radiographs were obtained for documentation purposes.   After attaining the desired flow of contrast documented above, a 0.5 to 1.0 ml test dose of 0.25% Marcaine was injected into each respective transforaminal space.  The patient was observed for 90 seconds post injection.  After no sensory deficits were reported, and normal lower extremity motor function was noted,   the above injectate was administered so  that equal amounts of the injectate were placed at each foramen (level) into the transforaminal epidural space.   Additional Comments:  No complications occurred Dressing: 2 x 2 sterile gauze and Band-Aid    Post-procedure details: Patient was observed during the  procedure. Post-procedure instructions were reviewed.  Patient left the clinic in stable condition.    Clinical History: EXAM: MRI LUMBAR SPINE WITHOUT CONTRAST   TECHNIQUE: Multiplanar, multisequence MR imaging of the lumbar spine was performed. No intravenous contrast was administered.   COMPARISON:  Lumbar radiography 09/14/2018   FINDINGS: Segmentation: 5 lumbar type vertebral bodies based on comparison radiography   Alignment: There is mild scoliosis. Grade 1 anterolisthesis at L4-5.   Vertebrae:  No fracture, evidence of discitis, or bone lesion.   Conus medullaris and cauda equina: Conus extends to the L1 level. Conus and cauda equina appear normal.   Paraspinal and other soft tissues: Negative   Disc levels:   T12- L1: Unremarkable.   L1-L2: Disc narrowing and circumferential bulging with endplate spur. Mild facet spurring.   L2-L3: Disc narrowing and bulging asymmetric to the left. Mild facet hypertrophy. No impingement   L3-L4: Mild facet spurring. Disc narrowing and circumferential bulging. Mild spinal stenosis.   L4-L5: Advanced facet arthropathy with spurring and anterolisthesis. The disc is narrowed and bulging preferentially towards the right. Degenerative changes and dorsal epidural fat expansion causes moderate to advanced spinal stenosis. There is right foraminal impingement with L4 flattening.   L5-S1:Facet degeneration with moderate spurring. Mild disc narrowing and bulging. There is a left foraminal herniation impinging on the L5 nerve root-see sagittal sequences   IMPRESSION: 1. Disc and facet degeneration with L4-5 anterolisthesis and mild scoliosis. 2. On the symptomatic left side there is L5 foraminal impingement due to disc protrusion. 3. L4-5 moderate to advanced spinal stenosis. Right foraminal impingement.     Electronically Signed   By: Marnee Spring M.D.   On: 09/29/2018 12:49     Objective:  VS:  HT:    WT:   BMI:      BP:   HR: bpm  TEMP: ( )  RESP:  Physical Exam Vitals and nursing note reviewed.  Constitutional:      General: She is not in acute distress.    Appearance: Normal appearance. She is not ill-appearing.  HENT:     Head: Normocephalic and atraumatic.     Right Ear: External ear normal.     Left Ear: External ear normal.  Eyes:     Extraocular Movements: Extraocular movements intact.  Cardiovascular:     Rate and Rhythm: Normal rate.     Pulses: Normal pulses.  Pulmonary:     Effort: Pulmonary effort is normal. No respiratory distress.  Abdominal:     General: There is no distension.     Palpations: Abdomen is soft.  Musculoskeletal:        General: Tenderness present.     Cervical back: Neck supple.     Right lower leg: No edema.     Left lower leg: No edema.     Comments: Patient has good distal strength with no pain over the greater trochanters.  No clonus or focal weakness.  Skin:    Findings: No erythema, lesion or rash.  Neurological:     General: No focal deficit present.     Mental Status: She is alert and oriented to person, place, and time.     Sensory: No sensory deficit.     Motor:  No weakness or abnormal muscle tone.     Coordination: Coordination normal.  Psychiatric:        Mood and Affect: Mood normal.        Behavior: Behavior normal.      Imaging: No results found.

## 2023-05-17 NOTE — Progress Notes (Signed)
Functional Pain Scale - descriptive words and definitions  Distressing (6)    Pain is present/unable to complete most ADLs limited by pain/sleep is difficult and active distraction is only marginal. Moderate range order  Average Pain 2-7   +Driver, -BT, -Dye Allergies.  Lower back pain on left that radiates into the groin and down the side of the left leg. Pain is worse when sitting or sleeping

## 2023-05-17 NOTE — Procedures (Signed)
Lumbosacral Transforaminal Epidural Steroid Injection - Sub-Pedicular Approach with Fluoroscopic Guidance  Patient: Melissa [name]      Date of Birth: 08/11/1951 MRN: [id number] PCP: Jarrett Soho, PA-C      Visit Date: 05/17/2023   Universal Protocol:    Date/Time: 05/17/2023  Consent Given By: the patient  Position: PRONE  Additional Comments: Vital signs were monitored before and after the procedure. Patient was prepped and draped in the usual sterile fashion. The correct patient, procedure, and site was verified.   Injection Procedure Details:   Procedure diagnoses: Lumbar radiculopathy [M54.16]    Meds Administered:  Meds ordered this encounter  Medications   methylPREDNISolone acetate (DEPO-MEDROL) injection 80 mg    Laterality: Left  Location/Site: L5  Needle:5.0 in., 22 ga.  Short bevel or Quincke spinal needle  Needle Placement: Transforaminal  Findings:    -Comments: Excellent flow of contrast along the nerve, nerve root and into the epidural space.  Procedure Details: After squaring off the end-plates to get a true AP view, the C-arm was positioned so that an oblique view of the foramen as noted above was visualized. The target area is just inferior to the "nose of the scotty dog" or sub pedicular. The soft tissues overlying this structure were infiltrated with 2-3 ml. of 1% Lidocaine without Epinephrine.  The spinal needle was inserted toward the target using a "trajectory" view along the fluoroscope beam.  Under AP and lateral visualization, the needle was advanced so it did not puncture dura and was located close the 6 O'Clock position of the pedical in AP tracterory. Biplanar projections were used to confirm position. Aspiration was confirmed to be negative for CSF and/or blood. A 1-2 ml. volume of Isovue-250 was injected and flow of contrast was noted at each level. Radiographs were obtained for documentation purposes.   After attaining the desired  flow of contrast documented above, a 0.5 to 1.0 ml test dose of 0.25% Marcaine was injected into each respective transforaminal space.  The patient was observed for 90 seconds post injection.  After no sensory deficits were reported, and normal lower extremity motor function was noted,   the above injectate was administered so that equal amounts of the injectate were placed at each foramen (level) into the transforaminal epidural space.   Additional Comments:  No complications occurred Dressing: 2 x 2 sterile gauze and Band-Aid    Post-procedure details: Patient was observed during the procedure. Post-procedure instructions were reviewed.  Patient left the clinic in stable condition.

## 2023-05-17 NOTE — Patient Instructions (Signed)

## 2023-06-04 DIAGNOSIS — I10 Essential (primary) hypertension: Secondary | ICD-10-CM | POA: Diagnosis not present

## 2023-06-04 DIAGNOSIS — E785 Hyperlipidemia, unspecified: Secondary | ICD-10-CM | POA: Diagnosis not present

## 2023-06-04 DIAGNOSIS — Z1389 Encounter for screening for other disorder: Secondary | ICD-10-CM | POA: Diagnosis not present

## 2023-06-04 DIAGNOSIS — E039 Hypothyroidism, unspecified: Secondary | ICD-10-CM | POA: Diagnosis not present

## 2023-06-04 DIAGNOSIS — Z6834 Body mass index (BMI) 34.0-34.9, adult: Secondary | ICD-10-CM | POA: Diagnosis not present

## 2023-06-04 DIAGNOSIS — Z0000 Encounter for general adult medical examination without abnormal findings: Secondary | ICD-10-CM | POA: Diagnosis not present

## 2023-06-04 DIAGNOSIS — R5383 Other fatigue: Secondary | ICD-10-CM | POA: Diagnosis not present

## 2023-06-04 DIAGNOSIS — R7303 Prediabetes: Secondary | ICD-10-CM | POA: Diagnosis not present

## 2023-06-04 DIAGNOSIS — Z23 Encounter for immunization: Secondary | ICD-10-CM | POA: Diagnosis not present

## 2023-06-09 DIAGNOSIS — M8588 Other specified disorders of bone density and structure, other site: Secondary | ICD-10-CM | POA: Diagnosis not present

## 2023-06-09 DIAGNOSIS — R7303 Prediabetes: Secondary | ICD-10-CM | POA: Diagnosis not present

## 2023-06-09 DIAGNOSIS — I10 Essential (primary) hypertension: Secondary | ICD-10-CM | POA: Diagnosis not present

## 2023-06-09 DIAGNOSIS — E785 Hyperlipidemia, unspecified: Secondary | ICD-10-CM | POA: Diagnosis not present

## 2023-06-09 DIAGNOSIS — E559 Vitamin D deficiency, unspecified: Secondary | ICD-10-CM | POA: Diagnosis not present

## 2023-06-09 DIAGNOSIS — Z6834 Body mass index (BMI) 34.0-34.9, adult: Secondary | ICD-10-CM | POA: Diagnosis not present

## 2023-06-09 DIAGNOSIS — E2839 Other primary ovarian failure: Secondary | ICD-10-CM | POA: Diagnosis not present

## 2023-06-09 DIAGNOSIS — F411 Generalized anxiety disorder: Secondary | ICD-10-CM | POA: Diagnosis not present

## 2023-06-09 DIAGNOSIS — E039 Hypothyroidism, unspecified: Secondary | ICD-10-CM | POA: Diagnosis not present

## 2023-06-14 DIAGNOSIS — R002 Palpitations: Secondary | ICD-10-CM | POA: Diagnosis not present

## 2023-06-14 DIAGNOSIS — F419 Anxiety disorder, unspecified: Secondary | ICD-10-CM | POA: Diagnosis not present

## 2023-06-22 DIAGNOSIS — M79662 Pain in left lower leg: Secondary | ICD-10-CM | POA: Diagnosis not present

## 2023-06-22 DIAGNOSIS — I83893 Varicose veins of bilateral lower extremities with other complications: Secondary | ICD-10-CM | POA: Diagnosis not present

## 2023-06-22 DIAGNOSIS — M79661 Pain in right lower leg: Secondary | ICD-10-CM | POA: Diagnosis not present

## 2023-06-22 DIAGNOSIS — I87393 Chronic venous hypertension (idiopathic) with other complications of bilateral lower extremity: Secondary | ICD-10-CM | POA: Diagnosis not present

## 2023-06-22 DIAGNOSIS — M79604 Pain in right leg: Secondary | ICD-10-CM | POA: Diagnosis not present

## 2023-07-21 DIAGNOSIS — I83891 Varicose veins of right lower extremities with other complications: Secondary | ICD-10-CM | POA: Diagnosis not present

## 2023-07-28 DIAGNOSIS — I83811 Varicose veins of right lower extremities with pain: Secondary | ICD-10-CM | POA: Diagnosis not present

## 2023-07-28 DIAGNOSIS — Z09 Encounter for follow-up examination after completed treatment for conditions other than malignant neoplasm: Secondary | ICD-10-CM | POA: Diagnosis not present

## 2023-07-28 DIAGNOSIS — I83891 Varicose veins of right lower extremities with other complications: Secondary | ICD-10-CM | POA: Diagnosis not present

## 2023-08-04 DIAGNOSIS — I83811 Varicose veins of right lower extremities with pain: Secondary | ICD-10-CM | POA: Diagnosis not present

## 2023-08-04 DIAGNOSIS — M7989 Other specified soft tissue disorders: Secondary | ICD-10-CM | POA: Diagnosis not present

## 2023-08-04 DIAGNOSIS — I83891 Varicose veins of right lower extremities with other complications: Secondary | ICD-10-CM | POA: Diagnosis not present

## 2023-08-11 DIAGNOSIS — M79605 Pain in left leg: Secondary | ICD-10-CM | POA: Diagnosis not present

## 2023-08-11 DIAGNOSIS — I83892 Varicose veins of left lower extremities with other complications: Secondary | ICD-10-CM | POA: Diagnosis not present

## 2023-08-11 DIAGNOSIS — Z09 Encounter for follow-up examination after completed treatment for conditions other than malignant neoplasm: Secondary | ICD-10-CM | POA: Diagnosis not present

## 2023-08-11 DIAGNOSIS — M79604 Pain in right leg: Secondary | ICD-10-CM | POA: Diagnosis not present

## 2023-08-19 DIAGNOSIS — I83891 Varicose veins of right lower extremities with other complications: Secondary | ICD-10-CM | POA: Diagnosis not present

## 2023-08-19 DIAGNOSIS — I83811 Varicose veins of right lower extremities with pain: Secondary | ICD-10-CM | POA: Diagnosis not present

## 2023-08-25 DIAGNOSIS — M7989 Other specified soft tissue disorders: Secondary | ICD-10-CM | POA: Diagnosis not present

## 2023-08-25 DIAGNOSIS — I83812 Varicose veins of left lower extremities with pain: Secondary | ICD-10-CM | POA: Diagnosis not present

## 2023-09-08 DIAGNOSIS — I83812 Varicose veins of left lower extremities with pain: Secondary | ICD-10-CM | POA: Diagnosis not present

## 2023-10-28 DIAGNOSIS — I8003 Phlebitis and thrombophlebitis of superficial vessels of lower extremities, bilateral: Secondary | ICD-10-CM | POA: Diagnosis not present

## 2023-11-25 ENCOUNTER — Encounter: Payer: Self-pay | Admitting: Physician Assistant

## 2023-11-25 ENCOUNTER — Ambulatory Visit: Payer: Medicare HMO | Admitting: Physician Assistant

## 2023-11-25 DIAGNOSIS — M7061 Trochanteric bursitis, right hip: Secondary | ICD-10-CM | POA: Diagnosis not present

## 2023-11-25 MED ORDER — LIDOCAINE HCL 1 % IJ SOLN
3.0000 mL | INTRAMUSCULAR | Status: AC | PRN
  Administered 2023-11-25: 3 mL

## 2023-11-25 MED ORDER — METHYLPREDNISOLONE ACETATE 40 MG/ML IJ SUSP
40.0000 mg | INTRAMUSCULAR | Status: AC | PRN
  Administered 2023-11-25: 40 mg via INTRA_ARTICULAR

## 2023-11-25 NOTE — Progress Notes (Signed)
    Procedure Note  Patient: Melissa [name]             Date of Birth: Mar 26, 1951           MRN: [id number]             Visit Date: 11/25/2023  HPI: [name] comes in today due to right buttocks pain that began about 2 months ago.  She states when driving she has pain in her right hip buttocks region.  No groin pain.  Pain is worse after sitting for long periods of time.  No radicular symptoms down the leg.  She has had no known injury.  She is borderline diabetic.  No fevers chills.  She has had prior back issues and had ESI's in the past but states this pain is not similar to what she has had in the past from her back.  Pain does not awaken her at night.  Review of systems: See HPI otherwise negative or noncontributory.  Physical exam: General Well-developed well-nourished female in no acute distress.  She is able to get on and off the exam table on her own.  No assistive device. Bilateral hips: Good range of motion both hips without pain.  Tenderness over the right hip trochanteric region no tenderness of the left hip trochanteric region.  Nontender over the SI joints and lower lumbar spine paraspinous region bilaterally.  Procedures: Visit Diagnoses:  1. Trochanteric bursitis, right hip     Large Joint Inj on 11/25/2023 3:19 PM Indications: pain Details: 22 G 1.5 in needle, lateral approach  Arthrogram: No  Medications: 3 mL lidocaine  1 %; 40 mg methylPREDNISolone  acetate 40 MG/ML Outcome: tolerated well, no immediate complications Procedure, treatment alternatives, risks and benefits explained, specific risks discussed. Consent was given by the patient. Immediately prior to procedure a time out was called to verify the correct patient, procedure, equipment, support staff and site/side marked as required. Patient was prepped and draped in the usual sterile fashion.      Plan: She is shown IT band stretching exercises.  Will see her back in 2 weeks if pain persist or becomes  worse.  She develops any radicular symptoms we will most likely need a repeat MRI as her last MRI of the lumbar spine was in 2019 .  Questions were encouraged and answered at length.                                                                                                                                                      ++++++++++++++++++++++++++++++++++++++++++++++++ Q+ ++++++++   +++ + +   =

## 2023-12-02 DIAGNOSIS — R7303 Prediabetes: Secondary | ICD-10-CM | POA: Diagnosis not present

## 2023-12-03 DIAGNOSIS — I872 Venous insufficiency (chronic) (peripheral): Secondary | ICD-10-CM | POA: Diagnosis not present

## 2023-12-03 DIAGNOSIS — I87393 Chronic venous hypertension (idiopathic) with other complications of bilateral lower extremity: Secondary | ICD-10-CM | POA: Diagnosis not present

## 2023-12-07 DIAGNOSIS — E039 Hypothyroidism, unspecified: Secondary | ICD-10-CM | POA: Diagnosis not present

## 2023-12-07 DIAGNOSIS — E559 Vitamin D deficiency, unspecified: Secondary | ICD-10-CM | POA: Diagnosis not present

## 2023-12-07 DIAGNOSIS — E785 Hyperlipidemia, unspecified: Secondary | ICD-10-CM | POA: Diagnosis not present

## 2023-12-07 DIAGNOSIS — Z23 Encounter for immunization: Secondary | ICD-10-CM | POA: Diagnosis not present

## 2023-12-07 DIAGNOSIS — M8588 Other specified disorders of bone density and structure, other site: Secondary | ICD-10-CM | POA: Diagnosis not present

## 2023-12-07 DIAGNOSIS — Z6835 Body mass index (BMI) 35.0-35.9, adult: Secondary | ICD-10-CM | POA: Diagnosis not present

## 2023-12-07 DIAGNOSIS — I10 Essential (primary) hypertension: Secondary | ICD-10-CM | POA: Diagnosis not present

## 2023-12-07 DIAGNOSIS — R7303 Prediabetes: Secondary | ICD-10-CM | POA: Diagnosis not present

## 2023-12-07 DIAGNOSIS — F411 Generalized anxiety disorder: Secondary | ICD-10-CM | POA: Diagnosis not present

## 2023-12-07 DIAGNOSIS — E2839 Other primary ovarian failure: Secondary | ICD-10-CM | POA: Diagnosis not present

## 2023-12-09 DIAGNOSIS — L603 Nail dystrophy: Secondary | ICD-10-CM | POA: Diagnosis not present

## 2023-12-10 ENCOUNTER — Other Ambulatory Visit: Payer: Self-pay | Admitting: Family Medicine

## 2023-12-10 DIAGNOSIS — Z0000 Encounter for general adult medical examination without abnormal findings: Secondary | ICD-10-CM

## 2023-12-13 ENCOUNTER — Encounter: Payer: Self-pay | Admitting: Physician Assistant

## 2023-12-13 ENCOUNTER — Other Ambulatory Visit (INDEPENDENT_AMBULATORY_CARE_PROVIDER_SITE_OTHER): Payer: Self-pay

## 2023-12-13 ENCOUNTER — Ambulatory Visit: Payer: Medicare HMO | Admitting: Physician Assistant

## 2023-12-13 DIAGNOSIS — M7061 Trochanteric bursitis, right hip: Secondary | ICD-10-CM

## 2023-12-13 MED ORDER — DICLOFENAC SODIUM 75 MG PO TBEC: 75.0000 mg | DELAYED_RELEASE_TABLET | Freq: Two times a day (BID) | ORAL | 0 refills | Status: DC

## 2023-12-13 NOTE — Progress Notes (Signed)
HPI: Melissa [name] returns today follow-up status post right hip trochanteric injection on 11/25/2023.  She states the IT band stretching made her condition worse.  She also notes that the injection gave her no relief.  She only did the stretches for about a week.  She is now having pain she is pointing to the buttocks region bilaterally with the right side being worse than left.  Pain is worse with sitting.  She notes that she is unable to drive any distance due to the pain.  Denies any radicular symptoms down either leg.  Denies any back pain.  Pain is described as an achy pain in the buttocks region and is 6-7 out of 10 pain at worst.  She has tried Advil without any real relief.  Denies any fevers chills numbness or tingling down either leg.  No injury.  Review of systems see HPI otherwise negative  Physical exam: General Well-developed well-nourished female who ambulates without any assistive device.  Nonantalgic gait. Bilateral hips good range of motion.  Tenderness slightly over the trochanteric region.  Flexion to bilateral hips causes no discomfort.  Lower extremities: Negative straight leg raise bilaterally.  No tenderness over the SI joints.  Pain is more over the ischial region bilaterally.  Able to touch her toes without pain.  Extension lumbar spine causes no discomfort.  Tight hamstrings bilaterally  Radiographs: AP pelvis: Bilateral hips well located.  No acute fractures findings.  Hips overall well-preserved.  Degenerative disc disease lower lumbar spine.   Impression: Tight hamstrings  Plan: Offered her formal physical therapy she deferred.  Will have her work on hamstring stretching handouts given.  Stop her ibuprofen.  Placed her on diclofenac 75 mg twice daily.  Follow-up if pain continues or becomes worse.  Questions were encouraged and answered at length

## 2023-12-24 ENCOUNTER — Ambulatory Visit: Payer: Medicare HMO

## 2023-12-30 ENCOUNTER — Ambulatory Visit
Admission: RE | Admit: 2023-12-30 | Discharge: 2023-12-30 | Disposition: A | Discharge status: Home / Self Care | Payer: Medicare HMO | Source: Ambulatory Visit | Attending: Family Medicine | Admitting: Family Medicine

## 2023-12-30 DIAGNOSIS — Z0000 Encounter for general adult medical examination without abnormal findings: Secondary | ICD-10-CM

## 2023-12-30 DIAGNOSIS — Z1231 Encounter for screening mammogram for malignant neoplasm of breast: Secondary | ICD-10-CM | POA: Diagnosis not present

## 2024-01-04 DIAGNOSIS — H43813 Vitreous degeneration, bilateral: Secondary | ICD-10-CM | POA: Diagnosis not present

## 2024-01-04 DIAGNOSIS — E119 Type 2 diabetes mellitus without complications: Secondary | ICD-10-CM | POA: Diagnosis not present

## 2024-01-04 DIAGNOSIS — H524 Presbyopia: Secondary | ICD-10-CM | POA: Diagnosis not present

## 2024-01-04 DIAGNOSIS — H2513 Age-related nuclear cataract, bilateral: Secondary | ICD-10-CM | POA: Diagnosis not present

## 2024-01-06 DIAGNOSIS — Z6834 Body mass index (BMI) 34.0-34.9, adult: Secondary | ICD-10-CM | POA: Diagnosis not present

## 2024-01-06 DIAGNOSIS — R051 Acute cough: Secondary | ICD-10-CM | POA: Diagnosis not present

## 2024-01-06 DIAGNOSIS — J0140 Acute pansinusitis, unspecified: Secondary | ICD-10-CM | POA: Diagnosis not present

## 2024-01-09 ENCOUNTER — Other Ambulatory Visit: Payer: Self-pay | Admitting: Physician Assistant

## 2024-02-23 ENCOUNTER — Telehealth: Payer: Self-pay | Admitting: Physical Medicine and Rehabilitation

## 2024-02-23 NOTE — Telephone Encounter (Signed)
 Pt is requesting an appt. Best call back [phone]

## 2024-02-29 ENCOUNTER — Ambulatory Visit: Admitting: Physical Medicine and Rehabilitation

## 2024-02-29 DIAGNOSIS — M48062 Spinal stenosis, lumbar region with neurogenic claudication: Secondary | ICD-10-CM | POA: Diagnosis not present

## 2024-02-29 DIAGNOSIS — G8929 Other chronic pain: Secondary | ICD-10-CM | POA: Diagnosis not present

## 2024-02-29 DIAGNOSIS — M5442 Lumbago with sciatica, left side: Secondary | ICD-10-CM | POA: Diagnosis not present

## 2024-02-29 DIAGNOSIS — M5416 Radiculopathy, lumbar region: Secondary | ICD-10-CM | POA: Diagnosis not present

## 2024-02-29 NOTE — Progress Notes (Signed)
 Pain Scale   Average Pain 5 Patient advising her pain is located in lower back with radiating pain to left leg. Patient advising she is bothered by pain when driving and walking for extended periods of time, she is concerned that she is getting ready to move up [place] and with Driving and moving  it will be overwhelming.     +Driver, -BT, -Dye Allergies.

## 2024-02-29 NOTE — Progress Notes (Signed)
 Melissa [name] - 73 y.o. female MRN [id number]  Date of birth: [birth year]  Office Visit Note: Visit Date: 02/29/2024 PCP: Jarrett Soho, PA-C Referred by: Jarrett Soho, PA-C  Subjective: Chief Complaint  Patient presents with   Lower Back - Pain   HPI: Melissa [name] is a 73 y.o. female who comes in today for evaluation of chronic, worsening and severe bilateral lower back pain radiating to left buttock and down left lateral leg to knee. Pain ongoing for several years, worsens with standing and walking. She describes her pain as sore and aching sensation, currently rates as 8 out of 10. Some relief of pain with home exercise regimen, rest and use of medications. History of formal physical therapy/dry needling with our in house team, reports good relief with these treatments. Lumbar MRI imaging from 2019 exhibits moderate to severe spinal canal stenosis noted at L4-L5, left foraminal herniation impinging on the L5 nerve at the level of L5-S1. She underwent left L5 transforaminal epidural steroid injection in our office on 05/17/2023. She reports significant relief of pain with injection, greater than 80% for almost 1 year. Patient tells me that she is re-locating to [place] in June and would like to repeat injection before she leaves if possible. Patient denies focal weakness, numbness and tingling. No recent trauma or falls.      Review of Systems  Musculoskeletal:  Positive for back pain.  Neurological:  Negative for tingling, sensory change, focal weakness and weakness.  All other systems reviewed and are negative.  Otherwise per HPI.  Assessment & Plan: Visit Diagnoses:    ICD-10-CM   1. Chronic bilateral low back pain with left-sided sciatica  G89.29 Ambulatory referral to Physical Medicine Rehab   M54.42 MR LUMBAR SPINE WO CONTRAST    2. Lumbar radiculopathy  M54.16 Ambulatory referral to Physical Medicine Rehab    MR LUMBAR SPINE WO CONTRAST    3. Spinal  stenosis of lumbar region with neurogenic claudication  M48.062 Ambulatory referral to Physical Medicine Rehab    MR LUMBAR SPINE WO CONTRAST       Plan: Findings:  Chronic, worsening and severe bilateral lower back pain radiating to left buttock and down left lateral leg to knee. Patient continues to have severe pain despite good conservative therapies such as formal physical therapy/dry needling, home exercise regimen, rest and use of medications. Patients clinical presentation and exam are consistent with neurogenic claudication as a result of spinal canal stenosis. Her pain pattern does fit more of classic L5 nerve distribution. We discussed treatment plan in detail today. Next step is to perform diagnostic and hopefully therapeutic left L5 transforaminal epidural steroid injection under fluoroscopic guidance. If good relief of pain with injection we can repeat this procedure infrequently as needed. I also placed order for new lumbar MRI imaging as prior MRI is from 2019. Patient can get imaging burned on CD and take with her to new provider in [place]. She has no questions at this time. No red flag symptoms noted.     Meds & Orders: No orders of the defined types were placed in this encounter.   Orders Placed This Encounter  Procedures   MR LUMBAR SPINE WO CONTRAST   Ambulatory referral to Physical Medicine Rehab    Follow-up: No follow-ups on file.   Procedures: No procedures performed      Clinical History: EXAM: MRI LUMBAR SPINE WITHOUT CONTRAST   TECHNIQUE: Multiplanar, multisequence MR imaging of the lumbar spine was performed. No  intravenous contrast was administered.   COMPARISON:  Lumbar radiography 09/14/2018   FINDINGS: Segmentation: 5 lumbar type vertebral bodies based on comparison radiography   Alignment: There is mild scoliosis. Grade 1 anterolisthesis at L4-5.   Vertebrae:  No fracture, evidence of discitis, or bone lesion.   Conus medullaris and cauda  equina: Conus extends to the L1 level. Conus and cauda equina appear normal.   Paraspinal and other soft tissues: Negative   Disc levels:   T12- L1: Unremarkable.   L1-L2: Disc narrowing and circumferential bulging with endplate spur. Mild facet spurring.   L2-L3: Disc narrowing and bulging asymmetric to the left. Mild facet hypertrophy. No impingement   L3-L4: Mild facet spurring. Disc narrowing and circumferential bulging. Mild spinal stenosis.   L4-L5: Advanced facet arthropathy with spurring and anterolisthesis. The disc is narrowed and bulging preferentially towards the right. Degenerative changes and dorsal epidural fat expansion causes moderate to advanced spinal stenosis. There is right foraminal impingement with L4 flattening.   L5-S1:Facet degeneration with moderate spurring. Mild disc narrowing and bulging. There is a left foraminal herniation impinging on the L5 nerve root-see sagittal sequences   IMPRESSION: 1. Disc and facet degeneration with L4-5 anterolisthesis and mild scoliosis. 2. On the symptomatic left side there is L5 foraminal impingement due to disc protrusion. 3. L4-5 moderate to advanced spinal stenosis. Right foraminal impingement.     Electronically Signed   By: Marnee Spring M.D.   On: 09/29/2018 12:49   She reports that she has quit smoking. She has never used smokeless tobacco. No results for input(s): "HGBA1C", "LABURIC" in the last 8760 hours.  Objective:  VS:  HT:    WT:   BMI:     BP:   HR: bpm  TEMP: ( )  RESP:  Physical Exam Vitals and nursing note reviewed.  HENT:     Head: Normocephalic and atraumatic.     Right Ear: External ear normal.     Left Ear: External ear normal.     Nose: Nose normal.     Mouth/Throat:     Mouth: Mucous membranes are moist.  Eyes:     Extraocular Movements: Extraocular movements intact.  Cardiovascular:     Rate and Rhythm: Normal rate.     Pulses: Normal pulses.  Pulmonary:      Effort: Pulmonary effort is normal.  Abdominal:     General: Abdomen is flat. There is no distension.  Musculoskeletal:        General: Tenderness present.     Cervical back: Normal range of motion.     Comments: Patient rises from seated position to standing without difficulty. Good lumbar range of motion. No pain noted with facet loading. 5/5 strength noted with bilateral hip flexion, knee flexion/extension, ankle dorsiflexion/plantarflexion and EHL. No clonus noted bilaterally. No pain upon palpation of greater trochanters. No pain with internal/external rotation of bilateral hips. Sensation intact bilaterally. Dysesthesias noted to left L5 dermatome. Negative slump test bilaterally. Ambulates without aid, gait steady.     Skin:    General: Skin is warm and dry.     Capillary Refill: Capillary refill takes less than 2 seconds.  Neurological:     General: No focal deficit present.     Mental Status: She is alert and oriented to person, place, and time.  Psychiatric:        Mood and Affect: Mood normal.        Behavior: Behavior normal.     Ortho Exam  Imaging: No results found.  Past Medical/Family/Surgical/Social History: Medications & Allergies reviewed per EMR, new medications updated. Patient Active Problem List   Diagnosis Date Noted   Rotator cuff arthropathy, right 01/19/2022   Status post total left knee replacement 06/21/2020   Status post total right knee replacement 03/08/2020   Pain in right foot 07/05/2017   Chronic pain of right knee 07/05/2017   Chronic pain of left knee 07/05/2017   Unilateral primary osteoarthritis, left knee 07/05/2017   Unilateral primary osteoarthritis, right knee 07/05/2017   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, right 04/14/2017   Past Medical History:  Diagnosis Date   Arthritis    osteo arthritis   Hypertension    Hypothyroidism    Pneumonia    Pre-diabetes    Takes metformin   Walking pneumonia     Family History  Problem Relation Age of Onset   Breast cancer Maternal Aunt        in 86's   Past Surgical History:  Procedure Laterality Date   JOINT REPLACEMENT     MENISCUS REPAIR     right knee   MULTIPLE TOOTH EXTRACTIONS     with implants   ovaries removed     2003   TONSILLECTOMY     TOTAL KNEE ARTHROPLASTY Right 03/08/2020   Procedure: RIGHT TOTAL KNEE ARTHROPLASTY AND LEFT KNEE STEROID INJECTION;  Surgeon: Kathryne Hitch, MD;  Location: [facility];  Service: Orthopedics;  Laterality: Right;   TOTAL KNEE ARTHROPLASTY Left 06/21/2020   Procedure: LEFT TOTAL KNEE ARTHROPLASTY;  Surgeon: Kathryne Hitch, MD;  Location: [facility];  Service: Orthopedics;  Laterality: Left;   Social History   Occupational History    Employer: [employer]  Tobacco Use   Smoking status: Former    Types: Cigarettes   Smokeless tobacco: Never   Tobacco comments:    quit when 73 years old  Vaping Use   Vaping status: Never Used  Substance and Sexual Activity   Alcohol use: Yes    Alcohol/week: 2.0 standard drinks of alcohol    Types: 1 Glasses of wine, 1 Shots of liquor per week    Comment: daily   Drug use: Never   Sexual activity: Not Currently    Birth control/protection: None

## 2024-03-07 ENCOUNTER — Encounter: Payer: Self-pay | Admitting: Physical Medicine and Rehabilitation

## 2024-03-14 ENCOUNTER — Other Ambulatory Visit: Payer: Self-pay

## 2024-03-14 ENCOUNTER — Ambulatory Visit: Admitting: Physical Medicine and Rehabilitation

## 2024-03-14 VITALS — BP 134/85 | HR 76

## 2024-03-14 DIAGNOSIS — M5416 Radiculopathy, lumbar region: Secondary | ICD-10-CM

## 2024-03-14 MED ORDER — METHYLPREDNISOLONE ACETATE 40 MG/ML IJ SUSP
40.0000 mg | Freq: Once | INTRAMUSCULAR | Status: AC
  Administered 2024-03-14: 40 mg

## 2024-03-14 NOTE — Patient Instructions (Signed)

## 2024-03-14 NOTE — Progress Notes (Signed)
 Pain Scale   Average Pain 5 Patient advising she is unsure what causes her pain t start, she did advise that when standing it increases and at times she has pain when walking, but when sitting it does ease the pain        +Driver, -BT, -Dye Allergies.

## 2024-03-15 ENCOUNTER — Encounter: Payer: Self-pay | Admitting: Physical Medicine and Rehabilitation

## 2024-03-20 NOTE — Procedures (Signed)
 Lumbosacral Transforaminal Epidural Steroid Injection - Sub-Pedicular Approach with Fluoroscopic Guidance  Patient: Melissa [name]      Date of Birth: [birth year] MRN: [id number] PCP: Darnelle Elders, PA-C      Visit Date: 03/14/2024   Universal Protocol:    Date/Time: 03/14/2024  Consent Given By: the patient  Position: PRONE  Additional Comments: Vital signs were monitored before and after the procedure. Patient was prepped and draped in the usual sterile fashion. The correct patient, procedure, and site was verified.   Injection Procedure Details:   Procedure diagnoses: Lumbar radiculopathy [M54.16]    Meds Administered:  Meds ordered this encounter  Medications   methylPREDNISolone  acetate (DEPO-MEDROL ) injection 40 mg    Laterality: Left  Location/Site: L5  Needle:5.0 in., 22 ga.  Short bevel or Quincke spinal needle  Needle Placement: Transforaminal  Findings:    -Comments: Excellent flow of contrast along the nerve, nerve root and into the epidural space.  Procedure Details: After squaring off the end-plates to get a true AP view, the C-arm was positioned so that an oblique view of the foramen as noted above was visualized. The target area is just inferior to the "nose of the scotty dog" or sub pedicular. The soft tissues overlying this structure were infiltrated with 2-3 ml. of 1% Lidocaine  without Epinephrine .  The spinal needle was inserted toward the target using a "trajectory" view along the fluoroscope beam.  Under AP and lateral visualization, the needle was advanced so it did not puncture dura and was located close the 6 O'Clock position of the pedical in AP tracterory. Biplanar projections were used to confirm position. Aspiration was confirmed to be negative for CSF and/or blood. A 1-2 ml. volume of Isovue-250 was injected and flow of contrast was noted at each level. Radiographs were obtained for documentation purposes.   After attaining the desired  flow of contrast documented above, a 0.5 to 1.0 ml test dose of 0.25% Marcaine  was injected into each respective transforaminal space.  The patient was observed for 90 seconds post injection.  After no sensory deficits were reported, and normal lower extremity motor function was noted,   the above injectate was administered so that equal amounts of the injectate were placed at each foramen (level) into the transforaminal epidural space.   Additional Comments:  The patient tolerated the procedure well Dressing: 2 x 2 sterile gauze and Band-Aid    Post-procedure details: Patient was observed during the procedure. Post-procedure instructions were reviewed.  Patient left the clinic in stable condition.

## 2024-03-20 NOTE — Progress Notes (Signed)
 Melissa [name] - 73 y.o. female MRN [id number]  Date of birth: Feb 12, 1951  Office Visit Note: Visit Date: 03/14/2024 PCP: Darnelle Elders, PA-C Referred by: Darnelle Elders, PA-C  Subjective: Chief Complaint  Patient presents with   Lower Back - Pain   HPI:  Melissa [name] is a 73 y.o. female who comes in today at the request of Elvan Hamel, FNP for planned Left L5-S1 Lumbar Transforaminal epidural steroid injection with fluoroscopic guidance.  The patient has failed conservative care including home exercise, medications, time and activity modification.  This injection will be diagnostic and hopefully therapeutic.  Please see requesting physician notes for further details and justification.   ROS Otherwise per HPI.  Assessment & Plan: Visit Diagnoses:    ICD-10-CM   1. Lumbar radiculopathy  M54.16 XR C-ARM NO REPORT    Epidural Steroid injection    methylPREDNISolone  acetate (DEPO-MEDROL ) injection 40 mg      Plan: No additional findings.   Meds & Orders:  Meds ordered this encounter  Medications   methylPREDNISolone  acetate (DEPO-MEDROL ) injection 40 mg    Orders Placed This Encounter  Procedures   XR C-ARM NO REPORT   Epidural Steroid injection    Follow-up: Return for visit to requesting provider as needed.   Procedures: No procedures performed  Lumbosacral Transforaminal Epidural Steroid Injection - Sub-Pedicular Approach with Fluoroscopic Guidance  Patient: Melissa [name]      Date of Birth: [birth year] MRN: [id number] PCP: Darnelle Elders, PA-C      Visit Date: 03/14/2024   Universal Protocol:    Date/Time: 03/14/2024  Consent Given By: the patient  Position: PRONE  Additional Comments: Vital signs were monitored before and after the procedure. Patient was prepped and draped in the usual sterile fashion. The correct patient, procedure, and site was verified.   Injection Procedure Details:   Procedure diagnoses: Lumbar radiculopathy  [M54.16]    Meds Administered:  Meds ordered this encounter  Medications   methylPREDNISolone  acetate (DEPO-MEDROL ) injection 40 mg    Laterality: Left  Location/Site: L5  Needle:5.0 in., 22 ga.  Short bevel or Quincke spinal needle  Needle Placement: Transforaminal  Findings:    -Comments: Excellent flow of contrast along the nerve, nerve root and into the epidural space.  Procedure Details: After squaring off the end-plates to get a true AP view, the C-arm was positioned so that an oblique view of the foramen as noted above was visualized. The target area is just inferior to the "nose of the scotty dog" or sub pedicular. The soft tissues overlying this structure were infiltrated with 2-3 ml. of 1% Lidocaine  without Epinephrine .  The spinal needle was inserted toward the target using a "trajectory" view along the fluoroscope beam.  Under AP and lateral visualization, the needle was advanced so it did not puncture dura and was located close the 6 O'Clock position of the pedical in AP tracterory. Biplanar projections were used to confirm position. Aspiration was confirmed to be negative for CSF and/or blood. A 1-2 ml. volume of Isovue-250 was injected and flow of contrast was noted at each level. Radiographs were obtained for documentation purposes.   After attaining the desired flow of contrast documented above, a 0.5 to 1.0 ml test dose of 0.25% Marcaine  was injected into each respective transforaminal space.  The patient was observed for 90 seconds post injection.  After no sensory deficits were reported, and normal lower extremity motor function was noted,   the above injectate was administered so  that equal amounts of the injectate were placed at each foramen (level) into the transforaminal epidural space.   Additional Comments:  The patient tolerated the procedure well Dressing: 2 x 2 sterile gauze and Band-Aid    Post-procedure details: Patient was observed during the  procedure. Post-procedure instructions were reviewed.  Patient left the clinic in stable condition.    Clinical History: EXAM: MRI LUMBAR SPINE WITHOUT CONTRAST   TECHNIQUE: Multiplanar, multisequence MR imaging of the lumbar spine was performed. No intravenous contrast was administered.   COMPARISON:  Lumbar radiography 09/14/2018   FINDINGS: Segmentation: 5 lumbar type vertebral bodies based on comparison radiography   Alignment: There is mild scoliosis. Grade 1 anterolisthesis at L4-5.   Vertebrae:  No fracture, evidence of discitis, or bone lesion.   Conus medullaris and cauda equina: Conus extends to the L1 level. Conus and cauda equina appear normal.   Paraspinal and other soft tissues: Negative   Disc levels:   T12- L1: Unremarkable.   L1-L2: Disc narrowing and circumferential bulging with endplate spur. Mild facet spurring.   L2-L3: Disc narrowing and bulging asymmetric to the left. Mild facet hypertrophy. No impingement   L3-L4: Mild facet spurring. Disc narrowing and circumferential bulging. Mild spinal stenosis.   L4-L5: Advanced facet arthropathy with spurring and anterolisthesis. The disc is narrowed and bulging preferentially towards the right. Degenerative changes and dorsal epidural fat expansion causes moderate to advanced spinal stenosis. There is right foraminal impingement with L4 flattening.   L5-S1:Facet degeneration with moderate spurring. Mild disc narrowing and bulging. There is a left foraminal herniation impinging on the L5 nerve root-see sagittal sequences   IMPRESSION: 1. Disc and facet degeneration with L4-5 anterolisthesis and mild scoliosis. 2. On the symptomatic left side there is L5 foraminal impingement due to disc protrusion. 3. L4-5 moderate to advanced spinal stenosis. Right foraminal impingement.     Electronically Signed   By: Aleta Hutch M.D.   On: 09/29/2018 12:49     Objective:  VS:  HT:    WT:   BMI:      BP:134/85  HR:76bpm  TEMP: ( )  RESP:  Physical Exam Vitals and nursing note reviewed.  Constitutional:      General: She is not in acute distress.    Appearance: Normal appearance. She is obese. She is not ill-appearing.  HENT:     Head: Normocephalic and atraumatic.     Right Ear: External ear normal.     Left Ear: External ear normal.  Eyes:     Extraocular Movements: Extraocular movements intact.  Cardiovascular:     Rate and Rhythm: Normal rate.     Pulses: Normal pulses.  Pulmonary:     Effort: Pulmonary effort is normal. No respiratory distress.  Abdominal:     General: There is no distension.     Palpations: Abdomen is soft.  Musculoskeletal:        General: Tenderness present.     Cervical back: Neck supple.     Right lower leg: No edema.     Left lower leg: No edema.     Comments: Patient has good distal strength with no pain over the greater trochanters.  No clonus or focal weakness.  Skin:    Findings: No erythema, lesion or rash.  Neurological:     General: No focal deficit present.     Mental Status: She is alert and oriented to person, place, and time.     Sensory: No sensory deficit.  Motor: No weakness or abnormal muscle tone.     Coordination: Coordination normal.  Psychiatric:        Mood and Affect: Mood normal.        Behavior: Behavior normal.      Imaging: No results found.

## 2024-03-22 ENCOUNTER — Ambulatory Visit
Admission: RE | Admit: 2024-03-22 | Discharge: 2024-03-22 | Disposition: A | Discharge status: Home / Self Care | Source: Ambulatory Visit | Attending: Physical Medicine and Rehabilitation | Admitting: Physical Medicine and Rehabilitation

## 2024-03-22 DIAGNOSIS — M47816 Spondylosis without myelopathy or radiculopathy, lumbar region: Secondary | ICD-10-CM | POA: Diagnosis not present

## 2024-03-22 DIAGNOSIS — M5126 Other intervertebral disc displacement, lumbar region: Secondary | ICD-10-CM | POA: Diagnosis not present

## 2024-03-22 DIAGNOSIS — M5416 Radiculopathy, lumbar region: Secondary | ICD-10-CM

## 2024-03-22 DIAGNOSIS — G8929 Other chronic pain: Secondary | ICD-10-CM

## 2024-03-22 DIAGNOSIS — M48062 Spinal stenosis, lumbar region with neurogenic claudication: Secondary | ICD-10-CM

## 2024-03-22 DIAGNOSIS — M48061 Spinal stenosis, lumbar region without neurogenic claudication: Secondary | ICD-10-CM | POA: Diagnosis not present

## 2024-04-18 ENCOUNTER — Ambulatory Visit: Payer: Self-pay

## 2024-04-28 ENCOUNTER — Ambulatory Visit: Admitting: Physical Medicine and Rehabilitation

## 2024-04-28 ENCOUNTER — Encounter: Payer: Self-pay | Admitting: Physical Medicine and Rehabilitation

## 2024-04-28 DIAGNOSIS — M5416 Radiculopathy, lumbar region: Secondary | ICD-10-CM

## 2024-04-28 DIAGNOSIS — M7918 Myalgia, other site: Secondary | ICD-10-CM | POA: Diagnosis not present

## 2024-04-28 DIAGNOSIS — G8929 Other chronic pain: Secondary | ICD-10-CM | POA: Diagnosis not present

## 2024-04-28 MED ORDER — PREDNISONE 50 MG PO TABS: 50.0000 mg | ORAL_TABLET | Freq: Every day | ORAL | 0 refills | Status: AC

## 2024-04-28 NOTE — Progress Notes (Signed)
 PENNE ROSENSTOCK - 73 y.o. female MRN [id number]  Date of birth: [birth year]  Office Visit Note: Visit Date: 04/28/2024 PCP: Darnelle Elders, PA-C Referred by: Darnelle Elders, PA-C  Subjective: Chief Complaint  Patient presents with   Lower Back - Pain   HPI: Melissa [name] is a 73 y.o. female who comes in today for evaluation of chronic bilateral lower back pain radiating to left buttock and down left lateral leg to knee. Pain ongoing for several years, worsens with standing and walking. She describes her pain as sore and aching sensation. This pain has recently resolved post left L5 transforaminal epidural steroid injection performed in our office on 03/14/2024. She reports greater than 80% relief of pain with this procedure, her pain relief continues to sustain. Recent lumbar MRI imaging shows moderate spinal canal stenosis at L1-L2. There is moderate foraminal stenoses bilaterally at L1-L2, on the right at L4-L5 and bilaterally at L5-S1. Patient is currently packing to move to [place] , she will be relocating in the upcoming weeks. Patient denies focal weakness, numbness and tingling. No recent trauma or falls.   Also reports bilateral buttock pain radiating down posterior thighs to knees. Her pain worsens with prolonged sitting. Doesn't tend to bother her with standing and activity.This pain started in January. Recent lumbar epidural steroid injection did not seem to relieve this pain. She describes as tight and pulling sensation, currently rates as 5 out of 10.      Review of Systems  Musculoskeletal:  Positive for myalgias.  Neurological:  Negative for tingling, sensory change, focal weakness and weakness.  All other systems reviewed and are negative.  Otherwise per HPI.  Assessment & Plan: Visit Diagnoses:    ICD-10-CM   1. Chronic buttock pain  M79.18    G89.29     2. Lumbar radiculopathy  M54.16        Plan: Findings:  Chronic bilateral lower back pain  radiating to left buttock and down left lateral leg to knee. Complete resolution of these symptoms with recent left L5 transforaminal epidural steroid injection. Bilateral buttock pain radiating to posterior thighs down to knees continues with sitting. I discussed new lumbar MRI with her today using imaging and spine model.   Her clinical presentation and exam are complex, she has moderate spinal canal stenosis higher up at L1-L2, there is moderate biforaminal stenosis at L5-S1 that could be a pain generator. Her symptoms today are more consistent with S1 nerve pattern. She does not have pain with prolonged standing and walking. Her buttock pain does not seem to interfere with functional ability. We had long discussion regarding her plan of care. Her plan is to transfer care when she relocates to [place] . I provided her with copy of recent lumbar MRI imaging and will also prescribe short course of oral Prednisone in case her pain becomes severe during the moving process. Could look at bilateral S1 transforaminal epidural steroid injection at some point. I do feel she would benefit from short course of formal physical therapy, she does need to establish home exercise regimen as well. She has no questions at this time. No red flag symptoms noted.     Meds & Orders:  Meds ordered this encounter  Medications   predniSONE (DELTASONE) 50 MG tablet    Sig: Take 1 tablet (50 mg total) by mouth daily with breakfast. Take until completed.    Dispense:  5 tablet    Refill:  0   No orders of the defined  types were placed in this encounter.   Follow-up: Return if symptoms worsen or fail to improve.   Procedures: No procedures performed      Clinical History: CLINICAL DATA:  Low back pain   EXAM: MRI LUMBAR SPINE WITHOUT CONTRAST   TECHNIQUE: Multiplanar, multisequence MR imaging of the lumbar spine was performed. No intravenous contrast was administered.   COMPARISON:  MRI lumbar spine dated  09/29/2018   FINDINGS: Segmentation: Standard.   Alignment: Grade 1 anterolisthesis of L4 on L5. Mild dextrocurvature of the lumbar spine.   Vertebrae: Degenerative endplate marrow changes at a few levels. No compression fractures.   Conus medullaris and cauda equina: The conus medullaris terminates at the level of L1-L2. The distal spinal cord signal intensity is normal.   Paraspinal and other soft tissues: The visualized abdomen and pelvis show no soft tissue abnormality. The visualized aorta is normal.   Disc levels:   L1-L2: Disc bulge. Moderate bilateral facet arthropathy. Moderate bilateral neuroforaminal stenosis. Moderate spinal canal stenosis.   L2-L3: Disc bulge. Moderate bilateral facet arthropathy. Mild bilateral neuroforaminal stenosis. Mild spinal canal stenosis.   L3-L4: Disc bulge. Severe bilateral facet arthropathy. Mild bilateral neuroforaminal stenosis. Mild spinal canal stenosis.   L4-L5: Disc bulge. Severe bilateral facet arthropathy. Moderate right neuroforaminal stenosis. Mild spinal canal stenosis.   L5-S1: Disc bulge. Severe bilateral facet arthropathy. Moderate bilateral neuroforaminal stenosis. No spinal canal stenosis.   IMPRESSION: 1. Moderate spinal canal stenosis at L1-L2 secondary to disc bulging and facet arthropathy. 2. Moderate foraminal stenoses bilaterally at L1-L2, on the right at L4-L5 and bilaterally at L5-S1.     Electronically Signed   By: Johnanna Mylar M.D.   On: 04/13/2024 15:58   She reports that she has quit smoking. She has never used smokeless tobacco. No results for input(s): "HGBA1C", "LABURIC" in the last 8760 hours.  Objective:  VS:  HT:    WT:   BMI:     BP:   HR: bpm  TEMP: ( )  RESP:  Physical Exam Vitals and nursing note reviewed.  HENT:     Head: Normocephalic and atraumatic.     Right Ear: External ear normal.     Left Ear: External ear normal.     Nose: Nose normal.     Mouth/Throat:     Mouth:  Mucous membranes are moist.  Eyes:     Extraocular Movements: Extraocular movements intact.  Cardiovascular:     Rate and Rhythm: Normal rate.     Pulses: Normal pulses.  Pulmonary:     Effort: Pulmonary effort is normal.  Abdominal:     General: Abdomen is flat. There is no distension.  Musculoskeletal:        General: Tenderness present.     Cervical back: Normal range of motion.     Comments: Patient rises from seated position to standing without difficulty. Good lumbar range of motion. No pain noted with facet loading. 5/5 strength noted with bilateral hip flexion, knee flexion/extension, ankle dorsiflexion/plantarflexion and EHL. No clonus noted bilaterally. No pain upon palpation of greater trochanters. No pain with internal/external rotation of bilateral hips. Sensation intact bilaterally. Dysesthesias noted to bilateral S1 dermatomes. Negative slump test bilaterally. Ambulates without aid, gait steady.     Skin:    General: Skin is warm and dry.     Capillary Refill: Capillary refill takes less than 2 seconds.  Neurological:     General: No focal deficit present.     Mental Status:  She is alert and oriented to person, place, and time.  Psychiatric:        Mood and Affect: Mood normal.        Behavior: Behavior normal.     Ortho Exam  Imaging: No results found.  Past Medical/Family/Surgical/Social History: Medications & Allergies reviewed per EMR, new medications updated. Patient Active Problem List   Diagnosis Date Noted   Rotator cuff arthropathy, right 01/19/2022   Status post total left knee replacement 06/21/2020   Status post total right knee replacement 03/08/2020   Pain in right foot 07/05/2017   Chronic pain of right knee 07/05/2017   Chronic pain of left knee 07/05/2017   Unilateral primary osteoarthritis, left knee 07/05/2017   Unilateral primary osteoarthritis, right knee 07/05/2017   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, right  04/14/2017   Past Medical History:  Diagnosis Date   Arthritis    osteo arthritis   Hypertension    Hypothyroidism    Pneumonia    Pre-diabetes    Takes metformin    Walking pneumonia    Family History  Problem Relation Age of Onset   Breast cancer Maternal Aunt        in 54's   Past Surgical History:  Procedure Laterality Date   JOINT REPLACEMENT     MENISCUS REPAIR     right knee   MULTIPLE TOOTH EXTRACTIONS     with implants   ovaries removed     2003   TONSILLECTOMY     TOTAL KNEE ARTHROPLASTY Right 03/08/2020   Procedure: RIGHT TOTAL KNEE ARTHROPLASTY AND LEFT KNEE STEROID INJECTION;  Surgeon: Arnie Lao, MD;  Location: [facility];  Service: Orthopedics;  Laterality: Right;   TOTAL KNEE ARTHROPLASTY Left 06/21/2020   Procedure: LEFT TOTAL KNEE ARTHROPLASTY;  Surgeon: Arnie Lao, MD;  Location: [facility];  Service: Orthopedics;  Laterality: Left;   Social History   Occupational History    Employer: [employer]  Tobacco Use   Smoking status: Former    Types: Cigarettes   Smokeless tobacco: Never   Tobacco comments:    quit when 73 years old  Vaping Use   Vaping status: Never Used  Substance and Sexual Activity   Alcohol use: Yes    Alcohol/week: 2.0 standard drinks of alcohol    Types: 1 Glasses of wine, 1 Shots of liquor per week    Comment: daily   Drug use: Never   Sexual activity: Not Currently    Birth control/protection: None

## 2024-04-28 NOTE — Patient Instructions (Signed)

## 2024-04-28 NOTE — Progress Notes (Signed)
 Pain Scale   Average Pain 6   MRI review, Bilateral lower buttocks pain. Worse on right and worse with sitting for long periods.

## 2024-05-01 DIAGNOSIS — E039 Hypothyroidism, unspecified: Secondary | ICD-10-CM | POA: Diagnosis not present

## 2024-05-01 DIAGNOSIS — E559 Vitamin D deficiency, unspecified: Secondary | ICD-10-CM | POA: Diagnosis not present

## 2024-05-01 DIAGNOSIS — Z6835 Body mass index (BMI) 35.0-35.9, adult: Secondary | ICD-10-CM | POA: Diagnosis not present

## 2024-05-01 DIAGNOSIS — E785 Hyperlipidemia, unspecified: Secondary | ICD-10-CM | POA: Diagnosis not present

## 2024-05-01 DIAGNOSIS — I10 Essential (primary) hypertension: Secondary | ICD-10-CM | POA: Diagnosis not present

## 2024-05-01 DIAGNOSIS — R7303 Prediabetes: Secondary | ICD-10-CM | POA: Diagnosis not present

## 2024-05-03 DIAGNOSIS — Z129 Encounter for screening for malignant neoplasm, site unspecified: Secondary | ICD-10-CM | POA: Diagnosis not present

## 2024-05-03 DIAGNOSIS — D2271 Melanocytic nevi of right lower limb, including hip: Secondary | ICD-10-CM | POA: Diagnosis not present

## 2024-05-03 DIAGNOSIS — L821 Other seborrheic keratosis: Secondary | ICD-10-CM | POA: Diagnosis not present

## 2024-09-25 ENCOUNTER — Encounter: Payer: Self-pay | Admitting: Radiology
